# Patient Record
Sex: Female | Born: 1955 | Race: White | Hispanic: No | Marital: Married | Smoking: Never smoker
Health system: Southern US, Community
[De-identification: ages and names within clinical notes are randomized; demographics above are authoritative.]

## PROBLEM LIST (undated history)

## (undated) DIAGNOSIS — I4891 Unspecified atrial fibrillation: Secondary | ICD-10-CM

## (undated) DIAGNOSIS — C801 Malignant (primary) neoplasm, unspecified: Secondary | ICD-10-CM

## (undated) DIAGNOSIS — D649 Anemia, unspecified: Secondary | ICD-10-CM

## (undated) DIAGNOSIS — T4145XA Adverse effect of unspecified anesthetic, initial encounter: Secondary | ICD-10-CM

## (undated) DIAGNOSIS — T8859XA Other complications of anesthesia, initial encounter: Secondary | ICD-10-CM

## (undated) DIAGNOSIS — M1990 Unspecified osteoarthritis, unspecified site: Secondary | ICD-10-CM

## (undated) DIAGNOSIS — N950 Postmenopausal bleeding: Secondary | ICD-10-CM

## (undated) DIAGNOSIS — I499 Cardiac arrhythmia, unspecified: Secondary | ICD-10-CM

## (undated) DIAGNOSIS — R519 Headache, unspecified: Secondary | ICD-10-CM

## (undated) DIAGNOSIS — R51 Headache: Secondary | ICD-10-CM

## (undated) DIAGNOSIS — E669 Obesity, unspecified: Secondary | ICD-10-CM

## (undated) DIAGNOSIS — E7800 Pure hypercholesterolemia, unspecified: Secondary | ICD-10-CM

## (undated) HISTORY — DX: Obesity, unspecified: E66.9

## (undated) HISTORY — PX: TUBAL LIGATION: SHX77

## (undated) HISTORY — DX: Pure hypercholesterolemia, unspecified: E78.00

## (undated) HISTORY — PX: TOTAL HIP ARTHROPLASTY: SHX124

## (undated) HISTORY — DX: Unspecified atrial fibrillation: I48.91

## (undated) HISTORY — PX: TONSILLECTOMY: SUR1361

## (undated) HISTORY — DX: Postmenopausal bleeding: N95.0

---

## 2016-09-04 LAB — HM COLONOSCOPY

## 2016-11-06 DIAGNOSIS — I4892 Unspecified atrial flutter: Secondary | ICD-10-CM | POA: Insufficient documentation

## 2016-11-06 DIAGNOSIS — I480 Paroxysmal atrial fibrillation: Secondary | ICD-10-CM | POA: Insufficient documentation

## 2016-11-06 DIAGNOSIS — Z7901 Long term (current) use of anticoagulants: Secondary | ICD-10-CM | POA: Insufficient documentation

## 2017-05-19 DIAGNOSIS — Z7901 Long term (current) use of anticoagulants: Secondary | ICD-10-CM | POA: Diagnosis not present

## 2017-05-19 DIAGNOSIS — I480 Paroxysmal atrial fibrillation: Secondary | ICD-10-CM | POA: Diagnosis not present

## 2017-09-02 DIAGNOSIS — Z6841 Body Mass Index (BMI) 40.0 and over, adult: Secondary | ICD-10-CM | POA: Diagnosis not present

## 2017-09-02 DIAGNOSIS — E782 Mixed hyperlipidemia: Secondary | ICD-10-CM | POA: Diagnosis not present

## 2017-09-02 DIAGNOSIS — N950 Postmenopausal bleeding: Secondary | ICD-10-CM | POA: Diagnosis not present

## 2017-09-02 DIAGNOSIS — Z0000 Encounter for general adult medical examination without abnormal findings: Secondary | ICD-10-CM | POA: Diagnosis not present

## 2017-09-02 DIAGNOSIS — Z1231 Encounter for screening mammogram for malignant neoplasm of breast: Secondary | ICD-10-CM | POA: Diagnosis not present

## 2017-09-06 DIAGNOSIS — R799 Abnormal finding of blood chemistry, unspecified: Secondary | ICD-10-CM | POA: Diagnosis not present

## 2017-09-13 DIAGNOSIS — R799 Abnormal finding of blood chemistry, unspecified: Secondary | ICD-10-CM | POA: Diagnosis not present

## 2017-09-27 DIAGNOSIS — Z6841 Body Mass Index (BMI) 40.0 and over, adult: Secondary | ICD-10-CM | POA: Diagnosis not present

## 2017-09-27 DIAGNOSIS — S91321A Laceration with foreign body, right foot, initial encounter: Secondary | ICD-10-CM | POA: Diagnosis not present

## 2017-09-27 DIAGNOSIS — M79671 Pain in right foot: Secondary | ICD-10-CM | POA: Diagnosis not present

## 2017-10-01 DIAGNOSIS — M8589 Other specified disorders of bone density and structure, multiple sites: Secondary | ICD-10-CM | POA: Diagnosis not present

## 2017-10-01 DIAGNOSIS — M8580 Other specified disorders of bone density and structure, unspecified site: Secondary | ICD-10-CM | POA: Diagnosis not present

## 2017-10-01 DIAGNOSIS — N950 Postmenopausal bleeding: Secondary | ICD-10-CM | POA: Diagnosis not present

## 2017-10-01 DIAGNOSIS — Z1231 Encounter for screening mammogram for malignant neoplasm of breast: Secondary | ICD-10-CM | POA: Diagnosis not present

## 2017-10-01 DIAGNOSIS — R9389 Abnormal findings on diagnostic imaging of other specified body structures: Secondary | ICD-10-CM | POA: Diagnosis not present

## 2017-10-01 DIAGNOSIS — N959 Unspecified menopausal and perimenopausal disorder: Secondary | ICD-10-CM | POA: Diagnosis not present

## 2017-10-13 DIAGNOSIS — N8502 Endometrial intraepithelial neoplasia [EIN]: Secondary | ICD-10-CM | POA: Diagnosis not present

## 2017-10-13 DIAGNOSIS — N950 Postmenopausal bleeding: Secondary | ICD-10-CM | POA: Diagnosis not present

## 2017-10-25 ENCOUNTER — Telehealth: Payer: Self-pay | Admitting: *Deleted

## 2017-10-25 NOTE — Progress Notes (Signed)
Updated med list from dr richardson's office.

## 2017-10-25 NOTE — Telephone Encounter (Signed)
Called and left the patient a message to call the office back. Patient call be scheduled for an appt tomorrow at 11am. Called and spoke with a nurse at [facility] office, gave the appt

## 2017-10-26 ENCOUNTER — Encounter: Payer: Self-pay | Admitting: Gynecology

## 2017-10-26 ENCOUNTER — Inpatient Hospital Stay: Payer: PPO | Attending: Gynecology | Admitting: Gynecology

## 2017-10-26 VITALS — BP 141/57 | HR 58 | Temp 98.2°F | Resp 20 | Ht 62.0 in | Wt 250.6 lb

## 2017-10-26 DIAGNOSIS — N8501 Benign endometrial hyperplasia: Secondary | ICD-10-CM | POA: Insufficient documentation

## 2017-10-26 NOTE — Progress Notes (Signed)
Consult Note: Gyn-Onc   Helen West 62 y.o. female  Chief Complaint  Patient presents with  . Complex endometrial hyperplasia    Assessment : Complex atypical hyperplasia with papillary features.  Morbid obesity.  Atrial fibrillation being treated with Xarelto.  Plan: I had a good discussion with the patient and her husband regarding the natural history of complex atypical hyperplasia and the possibility that she has coexisting endometrial cancer.  Would recommend the patient undergo hysterectomy, bilateral salpingo-oophorectomy and possible sentinel node biopsy.  Surgical approaches were discussed and I recommend this be done laparoscopically with robotic assistance.  Patient and her husband are in agreement.  They understand the risks of surgery and the small possibility that she might require adjuvant therapy postoperatively.  They are desirous of going ahead with surgery next Tuesday and understand that Dr. [name] will be the primary surgeon.  She will be seen by anesthesia preoperatively as she had difficulty with speaking after being intubated for a total hip replacement.  She will withhold use of Xarelto for 5 days preoperatively.     HPI: 62 year old white married female gravida 3 seen in consultation request of Dr. Paula Compton regarding management of complex atypical hyperplasia with papillary features.  The patient presented with postmenopausal bleeding and underwent an ultrasound showing an endometrial stripe of 21 mm.  An endometrial biopsy was obtained on October 13, 2017.  Obstetrical history gravida 3.  Patient had a postpartum tubal ligation following her second child.  She has had no other abdominal or pelvic surgery.  She has had a right total hip replacement.  She has atrial fibrillation and is on Xarelto and metoprolol.  Review of Systems:10 point review of systems is negative except as noted in interval history.   Vitals: Blood pressure (!) 141/57, pulse (!)  58, temperature 98.2 F (36.8 C), temperature source Oral, resp. rate 20, height 5\' 2"  (1.575 m), weight 250 lb 9.6 oz (113.7 kg), SpO2 100 %.  Physical Exam: General : The patient is a healthy woman in no acute distress.  HEENT: normocephalic, extraoccular movements normal; neck is supple without thyromegally  Lynphnodes: Supraclavicular and inguinal nodes not enlarged  Abdomen: Obese soft, non-tender, no ascites, no organomegally, no masses, no hernias  Pelvic:  EGBUS: Normal female  Vagina: Normal, no lesions  Urethra and Bladder: Normal, non-tender  Cervix: Normal with no lesions Uterus: Difficult to delineate secondary to the patient's body habitus. Bi-manual examination: Non-tender; no adenxal masses or nodularity  Rectal: normal sphincter tone, no masses, no blood  Lower extremities: No edema or varicosities. Normal range of motion      No Known Allergies  Past Medical History:  Diagnosis Date  . A-fib ([place])   . High cholesterol   . Obesity   . Post-menopausal bleeding     Past Surgical History:  Procedure Laterality Date  . TOTAL HIP ARTHROPLASTY    . TUBAL LIGATION      Current Outpatient Medications  Medication Sig Dispense Refill  . acetaminophen (TYLENOL) 650 MG CR tablet Take 650 mg by mouth daily. Patient takes 3 tablets daily in the morning to equal a 1950mg  a day.(Per Patient)    . alendronate (FOSAMAX) 70 MG tablet Take 70 mg by mouth once a week. Take with a full glass of water on an empty stomach.    Marland Kitchen atorvastatin (LIPITOR) 10 MG tablet Take 10 mg by mouth daily.    . flecainide (TAMBOCOR) 100 MG tablet Take 100 mg by  mouth 2 (two) times daily.     . metoprolol tartrate (LOPRESSOR) 25 MG tablet Take 12.5 mg by mouth 2 (two) times daily.     Marland Kitchen OVER THE COUNTER MEDICATION Take 1 tablet by mouth daily. Mega Red 750mg  tablet once daily.    . rivaroxaban (XARELTO) 20 MG TABS tablet Take 20 mg by mouth daily with supper.     No current  facility-administered medications for this visit.     Social History   Socioeconomic History  . Marital status: Married    Spouse name: Not on file  . Number of children: Not on file  . Years of education: Not on file  . Highest education level: Not on file  Occupational History  . Not on file  Social Needs  . Financial resource strain: Not on file  . Food insecurity:    Worry: Not on file    Inability: Not on file  . Transportation needs:    Medical: Not on file    Non-medical: Not on file  Tobacco Use  . Smoking status: Never Smoker  . Smokeless tobacco: Never Used  Substance and Sexual Activity  . Alcohol use: Yes    Comment: occasional social  . Drug use: Never  . Sexual activity: Not on file  Lifestyle  . Physical activity:    Days per week: Not on file    Minutes per session: Not on file  . Stress: Not on file  Relationships  . Social connections:    Talks on phone: Not on file    Gets together: Not on file    Attends religious service: Not on file    Active member of club or organization: Not on file    Attends meetings of clubs or organizations: Not on file    Relationship status: Not on file  . Intimate partner violence:    Fear of current or ex partner: Not on file    Emotionally abused: Not on file    Physically abused: Not on file    Forced sexual activity: Not on file  Other Topics Concern  . Not on file  Social History Narrative  . Not on file    Family History  Problem Relation Age of Onset  . Aneurysm Mother   . Emphysema Father   . Cancer Paternal Uncle       Helen Sleigh, MD 10/26/2017, 12:17 PM

## 2017-10-26 NOTE — Progress Notes (Signed)
Please place orders in Epic as patient is being scheduled for a pre-op appointment! Thank you! 

## 2017-10-26 NOTE — H&P (View-Only) (Signed)
Consult Note: Gyn-Onc   Helen West 62 y.o. female  Chief Complaint  Patient presents with  . Complex endometrial hyperplasia    Assessment : Complex atypical hyperplasia with papillary features.  Morbid obesity.  Atrial fibrillation being treated with Xarelto.  Plan: I had a good discussion with the patient and her husband regarding the natural history of complex atypical hyperplasia and the possibility that she has coexisting endometrial cancer.  Would recommend the patient undergo hysterectomy, bilateral salpingo-oophorectomy and possible sentinel node biopsy.  Surgical approaches were discussed and I recommend this be done laparoscopically with robotic assistance.  Patient and her husband are in agreement.  They understand the risks of surgery and the small possibility that she might require adjuvant therapy postoperatively.  They are desirous of going ahead with surgery next Tuesday and understand that Dr. [name] will be the primary surgeon.  She will be seen by anesthesia preoperatively as she had difficulty with speaking after being intubated for a total hip replacement.  She will withhold use of Xarelto for 5 days preoperatively.     HPI: 62 year old white married female gravida 3 seen in consultation request of Dr. Paula Compton regarding management of complex atypical hyperplasia with papillary features.  The patient presented with postmenopausal bleeding and underwent an ultrasound showing an endometrial stripe of 21 mm.  An endometrial biopsy was obtained on October 13, 2017.  Obstetrical history gravida 3.  Patient had a postpartum tubal ligation following her second child.  She has had no other abdominal or pelvic surgery.  She has had a right total hip replacement.  She has atrial fibrillation and is on Xarelto and metoprolol.  Review of Systems:10 point review of systems is negative except as noted in interval history.   Vitals: Blood pressure (!) 141/57, pulse (!)  58, temperature 98.2 F (36.8 C), temperature source Oral, resp. rate 20, height 5\' 2"  (1.575 m), weight 250 lb 9.6 oz (113.7 kg), SpO2 100 %.  Physical Exam: General : The patient is a healthy woman in no acute distress.  HEENT: normocephalic, extraoccular movements normal; neck is supple without thyromegally  Lynphnodes: Supraclavicular and inguinal nodes not enlarged  Abdomen: Obese soft, non-tender, no ascites, no organomegally, no masses, no hernias  Pelvic:  EGBUS: Normal female  Vagina: Normal, no lesions  Urethra and Bladder: Normal, non-tender  Cervix: Normal with no lesions Uterus: Difficult to delineate secondary to the patient's body habitus. Bi-manual examination: Non-tender; no adenxal masses or nodularity  Rectal: normal sphincter tone, no masses, no blood  Lower extremities: No edema or varicosities. Normal range of motion      No Known Allergies  Past Medical History:  Diagnosis Date  . A-fib ([place])   . High cholesterol   . Obesity   . Post-menopausal bleeding     Past Surgical History:  Procedure Laterality Date  . TOTAL HIP ARTHROPLASTY    . TUBAL LIGATION      Current Outpatient Medications  Medication Sig Dispense Refill  . acetaminophen (TYLENOL) 650 MG CR tablet Take 650 mg by mouth daily. Patient takes 3 tablets daily in the morning to equal a 1950mg  a day.(Per Patient)    . alendronate (FOSAMAX) 70 MG tablet Take 70 mg by mouth once a week. Take with a full glass of water on an empty stomach.    Marland Kitchen atorvastatin (LIPITOR) 10 MG tablet Take 10 mg by mouth daily.    . flecainide (TAMBOCOR) 100 MG tablet Take 100 mg by  mouth 2 (two) times daily.     . metoprolol tartrate (LOPRESSOR) 25 MG tablet Take 12.5 mg by mouth 2 (two) times daily.     Marland Kitchen OVER THE COUNTER MEDICATION Take 1 tablet by mouth daily. Mega Red 750mg  tablet once daily.    . rivaroxaban (XARELTO) 20 MG TABS tablet Take 20 mg by mouth daily with supper.     No current  facility-administered medications for this visit.     Social History   Socioeconomic History  . Marital status: Married    Spouse name: Not on file  . Number of children: Not on file  . Years of education: Not on file  . Highest education level: Not on file  Occupational History  . Not on file  Social Needs  . Financial resource strain: Not on file  . Food insecurity:    Worry: Not on file    Inability: Not on file  . Transportation needs:    Medical: Not on file    Non-medical: Not on file  Tobacco Use  . Smoking status: Never Smoker  . Smokeless tobacco: Never Used  Substance and Sexual Activity  . Alcohol use: Yes    Comment: occasional social  . Drug use: Never  . Sexual activity: Not on file  Lifestyle  . Physical activity:    Days per week: Not on file    Minutes per session: Not on file  . Stress: Not on file  Relationships  . Social connections:    Talks on phone: Not on file    Gets together: Not on file    Attends religious service: Not on file    Active member of club or organization: Not on file    Attends meetings of clubs or organizations: Not on file    Relationship status: Not on file  . Intimate partner violence:    Fear of current or ex partner: Not on file    Emotionally abused: Not on file    Physically abused: Not on file    Forced sexual activity: Not on file  Other Topics Concern  . Not on file  Social History Narrative  . Not on file    Family History  Problem Relation Age of Onset  . Aneurysm Mother   . Emphysema Father   . Cancer Paternal Uncle       Marti Sleigh, MD 10/26/2017, 12:17 PM

## 2017-10-26 NOTE — Patient Instructions (Signed)
Preparing for your Surgery  Plan for surgery on November 02, 2017 with Dr. [name] at [place] will be scheduled for a robotic assisted total hysterectomy, bilateral salpingo-oophorectomy, sentinel lymph node biopsy.  Pre-operative Testing -You will receive a phone call from presurgical testing at [facility] to arrange for a pre-operative testing appointment before your surgery.  This appointment normally occurs one to two weeks before your scheduled surgery.   -Bring your insurance card, copy of an advanced directive if applicable, medication list  -At that visit, you will be asked to sign a consent for a possible blood transfusion in case a transfusion becomes necessary during surgery.  The need for a blood transfusion is rare but having consent is a necessary part of your care.     -STOP TAKING XARELTO NOW  Day Before Surgery at [place] will be asked to take in a light diet the day before surgery.  Avoid carbonated beverages.  You will be advised to have nothing to eat or drink after midnight the evening before.    Eat a light diet the day before surgery.  Examples including soups, broths, toast, yogurt, mashed potatoes.  Things to avoid include carbonated beverages (fizzy beverages), raw fruits and raw vegetables, or beans.   If your bowels are filled with gas, your surgeon will have difficulty visualizing your pelvic organs which increases your surgical risks.  Your role in recovery Your role is to become active as soon as directed by your doctor, while still giving yourself time to heal.  Rest when you feel tired. You will be asked to do the following in order to speed your recovery:  - Cough and breathe deeply. This helps toclear and expand your lungs and can prevent pneumonia. You may be given a spirometer to practice deep breathing. A staff member will show you how to use the spirometer. - Do mild physical activity. Walking or moving your  legs help your circulation and body functions return to normal. A staff member will help you when you try to walk and will provide you with simple exercises. Do not try to get up or walk alone the first time. - Actively manage your pain. Managing your pain lets you move in comfort. We will ask you to rate your pain on a scale of zero to 10. It is your responsibility to tell your doctor or nurse where and how much you hurt so your pain can be treated.  Special Considerations -If you are diabetic, you may be placed on insulin after surgery to have closer control over your blood sugars to promote healing and recovery.  This does not mean that you will be discharged on insulin.  If applicable, your oral antidiabetics will be resumed when you are tolerating a solid diet.  -Your final pathology results from surgery should be available by the Friday after surgery and the results will be relayed to you when available.  -Dr. Lahoma Crocker is the Surgeon that assists your GYN Oncologist with surgery.  The next day after your surgery you will either see your GYN Oncologist or Dr. Lahoma Crocker.   Blood Transfusion Information WHAT IS A BLOOD TRANSFUSION? A transfusion is the replacement of blood or some of its parts. Blood is made up of multiple cells which provide different functions.  Red blood cells carry oxygen and are used for blood loss replacement.  White blood cells fight against infection.  Platelets control bleeding.  Plasma helps clot  blood.  Other blood products are available for specialized needs, such as hemophilia or other clotting disorders. BEFORE THE TRANSFUSION  Who gives blood for transfusions?   You may be able to donate blood to be used at a later date on yourself (autologous donation).  Relatives can be asked to donate blood. This is generally not any safer than if you have received blood from a stranger. The same precautions are taken to ensure safety when a  relative's blood is donated.  Healthy volunteers who are fully evaluated to make sure their blood is safe. This is blood bank blood. Transfusion therapy is the safest it has ever been in the practice of medicine. Before blood is taken from a donor, a complete history is taken to make sure that person has no history of diseases nor engages in risky social behavior (examples are intravenous drug use or sexual activity with multiple partners). The donor's travel history is screened to minimize risk of transmitting infections, such as malaria. The donated blood is tested for signs of infectious diseases, such as HIV and hepatitis. The blood is then tested to be sure it is compatible with you in order to minimize the chance of a transfusion reaction. If you or a relative donates blood, this is often done in anticipation of surgery and is not appropriate for emergency situations. It takes many days to process the donated blood. RISKS AND COMPLICATIONS Although transfusion therapy is very safe and saves many lives, the main dangers of transfusion include:   Getting an infectious disease.  Developing a transfusion reaction. This is an allergic reaction to something in the blood you were given. Every precaution is taken to prevent this. The decision to have a blood transfusion has been considered carefully by your caregiver before blood is given. Blood is not given unless the benefits outweigh the risks.

## 2017-10-29 ENCOUNTER — Telehealth: Payer: Self-pay | Admitting: Oncology

## 2017-10-29 ENCOUNTER — Encounter: Payer: Self-pay | Admitting: Oncology

## 2017-10-29 NOTE — Telephone Encounter (Signed)
Called patient and verfied that she does see Dr. Adrian Prows for cardiology in [place].  She also mentioned her records may be under her maiden name - Pearo.  Called the [place] office and requested last office note and any procedures that she has had.  Also left a message for Dr. Blane Ohara nurse, Lovena Le.

## 2017-10-29 NOTE — Patient Instructions (Signed)
Helen West  10/29/2017   Your procedure is scheduled on: 11-02-17     Report to [facility] Main  Entrance    Report to Admitting at 5:30 AM    Call this number if you have problems the morning of surgery [phone]    Remember: Do not eat food or drink liquids :After Midnight.  Eat a light diet the day before surgery.  Examples including soups, broths, toast, yogurt, mashed potatoes.  Things to avoid include carbonated beverages (fizzy beverages), raw fruits and raw vegetables, or beans.   If your bowels are filled with gas, your surgeon will have difficulty visualizing your pelvic organs which increases your surgical risks.     Take these medicines the morning of surgery with A SIP OF WATER: Metoprolol Tartrate (Lopressor), Flecainide (Tambocor), and Atorvastatin (Lipitor)                                You may not have any metal on your body including hair pins and              piercings  Do not wear jewelry, make-up, lotions, powders or perfumes, deodorant             Do not wear nail polish.  Do not shave  48 hours prior to surgery.              Do not bring valuables to the hospital. [place].  Contacts, dentures or bridgework may not be worn into surgery.  Leave suitcase in the car. After surgery it may be brought to your room.   :  Special Instructions: Cough and Deep Breathe Exercises              Please read over the following fact sheets you were given: _____________________________________________________________________             [facility] - Preparing for Surgery Before surgery, you can play an important role.  Because skin is not sterile, your skin needs to be as free of germs as possible.  You can reduce the number of germs on your skin by washing with CHG (chlorahexidine gluconate) soap before surgery.  CHG is an antiseptic cleaner which kills germs and bonds with the skin to  continue killing germs even after washing. Please DO NOT use if you have an allergy to CHG or antibacterial soaps.  If your skin becomes reddened/irritated stop using the CHG and inform your nurse when you arrive at Short Stay. Do not shave (including legs and underarms) for at least 48 hours prior to the first CHG shower.  You may shave your face/neck. Please follow these instructions carefully:  1.  Shower with CHG Soap the night before surgery and the  morning of Surgery.  2.  If you choose to wash your hair, wash your hair first as usual with your  normal  shampoo.  3.  After you shampoo, rinse your hair and body thoroughly to remove the  shampoo.                           4.  Use CHG as you would any other liquid soap.  You can apply  chg directly  to the skin and wash                       Gently with a scrungie or clean washcloth.  5.  Apply the CHG Soap to your body ONLY FROM THE NECK DOWN.   Do not use on face/ open                           Wound or open sores. Avoid contact with eyes, ears mouth and genitals (private parts).                       Wash face,  Genitals (private parts) with your normal soap.             6.  Wash thoroughly, paying special attention to the area where your surgery  will be performed.  7.  Thoroughly rinse your body with warm water from the neck down.  8.  DO NOT shower/wash with your normal soap after using and rinsing off  the CHG Soap.                9.  Pat yourself dry with a clean towel.            10.  Wear clean pajamas.            11.  Place clean sheets on your bed the night of your first shower and do not  sleep with pets. Day of Surgery : Do not apply any lotions/deodorants the morning of surgery.  Please wear clean clothes to the hospital/surgery center.  FAILURE TO FOLLOW THESE INSTRUCTIONS MAY RESULT IN THE CANCELLATION OF YOUR SURGERY PATIENT SIGNATURE_________________________________  NURSE  SIGNATURE__________________________________  ________________________________________________________________________   Adam Phenix  An incentive spirometer is a tool that can help keep your lungs clear and active. This tool measures how well you are filling your lungs with each breath. Taking long deep breaths may help reverse or decrease the chance of developing breathing (pulmonary) problems (especially infection) following:  A long period of time when you are unable to move or be active. BEFORE THE PROCEDURE   If the spirometer includes an indicator to show your best effort, your nurse or respiratory therapist will set it to a desired goal.  If possible, sit up straight or lean slightly forward. Try not to slouch.  Hold the incentive spirometer in an upright position. INSTRUCTIONS FOR USE  1. Sit on the edge of your bed if possible, or sit up as far as you can in bed or on a chair. 2. Hold the incentive spirometer in an upright position. 3. Breathe out normally. 4. Place the mouthpiece in your mouth and seal your lips tightly around it. 5. Breathe in slowly and as deeply as possible, raising the piston or the ball toward the top of the column. 6. Hold your breath for 3-5 seconds or for as long as possible. Allow the piston or ball to fall to the bottom of the column. 7. Remove the mouthpiece from your mouth and breathe out normally. 8. Rest for a few seconds and repeat Steps 1 through 7 at least 10 times every 1-2 hours when you are awake. Take your time and take a few normal breaths between deep breaths. 9. The spirometer may include an indicator to show your best effort. Use the indicator as a goal to work toward during each repetition.  10. After each set of 10 deep breaths, practice coughing to be sure your lungs are clear. If you have an incision (the cut made at the time of surgery), support your incision when coughing by placing a pillow or rolled up towels firmly  against it. Once you are able to get out of bed, walk around indoors and cough well. You may stop using the incentive spirometer when instructed by your caregiver.  RISKS AND COMPLICATIONS  Take your time so you do not get dizzy or light-headed.  If you are in pain, you may need to take or ask for pain medication before doing incentive spirometry. It is harder to take a deep breath if you are having pain. AFTER USE  Rest and breathe slowly and easily.  It can be helpful to keep track of a log of your progress. Your caregiver can provide you with a simple table to help with this. If you are using the spirometer at home, follow these instructions: [place] IF:   You are having difficultly using the spirometer.  You have trouble using the spirometer as often as instructed.  Your pain medication is not giving enough relief while using the spirometer.  You develop fever of 100.5 F (38.1 C) or higher. SEEK IMMEDIATE MEDICAL CARE IF:   You cough up bloody sputum that had not been present before.  You develop fever of 102 F (38.9 C) or greater.  You develop worsening pain at or near the incision site. MAKE SURE YOU:   Understand these instructions.  Will watch your condition.  Will get help right away if you are not doing well or get worse. Document Released: 06/01/2006 Document Revised: 04/13/2011 Document Reviewed: 08/02/2006 ExitCare Patient Information 2014 ExitCare, [place].   ________________________________________________________________________  WHAT IS A BLOOD TRANSFUSION? Blood Transfusion Information  A transfusion is the replacement of blood or some of its parts. Blood is made up of multiple cells which provide different functions.  Red blood cells carry oxygen and are used for blood loss replacement.  White blood cells fight against infection.  Platelets control bleeding.  Plasma helps clot blood.  Other blood products are available for  specialized needs, such as hemophilia or other clotting disorders. BEFORE THE TRANSFUSION  Who gives blood for transfusions?   Healthy volunteers who are fully evaluated to make sure their blood is safe. This is blood bank blood. Transfusion therapy is the safest it has ever been in the practice of medicine. Before blood is taken from a donor, a complete history is taken to make sure that person has no history of diseases nor engages in risky social behavior (examples are intravenous drug use or sexual activity with multiple partners). The donor's travel history is screened to minimize risk of transmitting infections, such as malaria. The donated blood is tested for signs of infectious diseases, such as HIV and hepatitis. The blood is then tested to be sure it is compatible with you in order to minimize the chance of a transfusion reaction. If you or a relative donates blood, this is often done in anticipation of surgery and is not appropriate for emergency situations. It takes many days to process the donated blood. RISKS AND COMPLICATIONS Although transfusion therapy is very safe and saves many lives, the main dangers of transfusion include:   Getting an infectious disease.  Developing a transfusion reaction. This is an allergic reaction to something in the blood you were given. Every precaution is taken to prevent this. The  decision to have a blood transfusion has been considered carefully by your caregiver before blood is given. Blood is not given unless the benefits outweigh the risks. AFTER THE TRANSFUSION  Right after receiving a blood transfusion, you will usually feel much better and more energetic. This is especially true if your red blood cells have gotten low (anemic). The transfusion raises the level of the red blood cells which carry oxygen, and this usually causes an energy increase.  The nurse administering the transfusion will monitor you carefully for complications. HOME CARE  INSTRUCTIONS  No special instructions are needed after a transfusion. You may find your energy is better. Speak with your caregiver about any limitations on activity for underlying diseases you may have. SEEK MEDICAL CARE IF:   Your condition is not improving after your transfusion.  You develop redness or irritation at the intravenous (IV) site. SEEK IMMEDIATE MEDICAL CARE IF:  Any of the following symptoms occur over the next 12 hours:  Shaking chills.  You have a temperature by mouth above 102 F (38.9 C), not controlled by medicine.  Chest, back, or muscle pain.  People around you feel you are not acting correctly or are confused.  Shortness of breath or difficulty breathing.  Dizziness and fainting.  You get a rash or develop hives.  You have a decrease in urine output.  Your urine turns a dark color or changes to pink, red, or brown. Any of the following symptoms occur over the next 10 days:  You have a temperature by mouth above 102 F (38.9 C), not controlled by medicine.  Shortness of breath.  Weakness after normal activity.  The white part of the eye turns yellow (jaundice).  You have a decrease in the amount of urine or are urinating less often.  Your urine turns a dark color or changes to pink, red, or brown. Document Released: 01/17/2000 Document Revised: 04/13/2011 Document Reviewed: 09/05/2007 [facility] Patient Information 2014 [place], [place].  _______________________________________________________________________

## 2017-11-01 ENCOUNTER — Encounter (HOSPITAL_COMMUNITY): Payer: Self-pay | Admitting: *Deleted

## 2017-11-01 ENCOUNTER — Other Ambulatory Visit: Payer: Self-pay

## 2017-11-01 ENCOUNTER — Encounter (HOSPITAL_COMMUNITY)
Admission: RE | Admit: 2017-11-01 | Discharge: 2017-11-01 | Disposition: A | Discharge status: Home / Self Care | Payer: PPO | Source: Ambulatory Visit | Attending: Gynecologic Oncology | Admitting: Gynecologic Oncology

## 2017-11-01 DIAGNOSIS — Z79899 Other long term (current) drug therapy: Secondary | ICD-10-CM | POA: Diagnosis not present

## 2017-11-01 DIAGNOSIS — Z01812 Encounter for preprocedural laboratory examination: Secondary | ICD-10-CM

## 2017-11-01 DIAGNOSIS — N8502 Endometrial intraepithelial neoplasia [EIN]: Secondary | ICD-10-CM | POA: Insufficient documentation

## 2017-11-01 DIAGNOSIS — E7800 Pure hypercholesterolemia, unspecified: Secondary | ICD-10-CM | POA: Diagnosis not present

## 2017-11-01 DIAGNOSIS — I4891 Unspecified atrial fibrillation: Secondary | ICD-10-CM | POA: Diagnosis not present

## 2017-11-01 DIAGNOSIS — Z6841 Body Mass Index (BMI) 40.0 and over, adult: Secondary | ICD-10-CM | POA: Diagnosis not present

## 2017-11-01 DIAGNOSIS — C541 Malignant neoplasm of endometrium: Secondary | ICD-10-CM | POA: Diagnosis not present

## 2017-11-01 DIAGNOSIS — Z7901 Long term (current) use of anticoagulants: Secondary | ICD-10-CM | POA: Diagnosis not present

## 2017-11-01 HISTORY — DX: Anemia, unspecified: D64.9

## 2017-11-01 HISTORY — DX: Headache: R51

## 2017-11-01 HISTORY — DX: Cardiac arrhythmia, unspecified: I49.9

## 2017-11-01 HISTORY — DX: Unspecified osteoarthritis, unspecified site: M19.90

## 2017-11-01 HISTORY — DX: Headache, unspecified: R51.9

## 2017-11-01 HISTORY — DX: Adverse effect of unspecified anesthetic, initial encounter: T41.45XA

## 2017-11-01 HISTORY — DX: Other complications of anesthesia, initial encounter: T88.59XA

## 2017-11-01 HISTORY — DX: Malignant (primary) neoplasm, unspecified: C80.1

## 2017-11-01 LAB — URINALYSIS, ROUTINE W REFLEX MICROSCOPIC
Bacteria, UA: NONE SEEN
Bilirubin Urine: NEGATIVE
Glucose, UA: NEGATIVE mg/dL
KETONES UR: NEGATIVE mg/dL
Nitrite: NEGATIVE
PROTEIN: NEGATIVE mg/dL
Specific Gravity, Urine: 1.013 (ref 1.005–1.030)
pH: 6 (ref 5.0–8.0)

## 2017-11-01 LAB — COMPREHENSIVE METABOLIC PANEL
ALT: 14 U/L (ref 0–44)
ANION GAP: 9 (ref 5–15)
AST: 17 U/L (ref 15–41)
Albumin: 4 g/dL (ref 3.5–5.0)
Alkaline Phosphatase: 75 U/L (ref 38–126)
BILIRUBIN TOTAL: 0.7 mg/dL (ref 0.3–1.2)
BUN: 15 mg/dL (ref 8–23)
CO2: 30 mmol/L (ref 22–32)
Calcium: 9.4 mg/dL (ref 8.9–10.3)
Chloride: 105 mmol/L (ref 98–111)
Creatinine, Ser: 0.73 mg/dL (ref 0.44–1.00)
Glucose, Bld: 107 mg/dL — ABNORMAL HIGH (ref 70–99)
POTASSIUM: 5.4 mmol/L — AB (ref 3.5–5.1)
Sodium: 144 mmol/L (ref 135–145)
TOTAL PROTEIN: 7.1 g/dL (ref 6.5–8.1)

## 2017-11-01 LAB — CBC
HEMATOCRIT: 43.1 % (ref 36.0–46.0)
Hemoglobin: 14 g/dL (ref 12.0–15.0)
MCH: 31 pg (ref 26.0–34.0)
MCHC: 32.5 g/dL (ref 30.0–36.0)
MCV: 95.6 fL (ref 78.0–100.0)
Platelets: 316 10*3/uL (ref 150–400)
RBC: 4.51 MIL/uL (ref 3.87–5.11)
RDW: 13.3 % (ref 11.5–15.5)
WBC: 5.8 10*3/uL (ref 4.0–10.5)

## 2017-11-01 LAB — ABO/RH: ABO/RH(D): A POS

## 2017-11-01 NOTE — Progress Notes (Signed)
10-29-17 Cardiac Clearance on chart  01-19-17 EKG on chart, under pt's maiden name (Pearo)

## 2017-11-02 ENCOUNTER — Ambulatory Visit (HOSPITAL_COMMUNITY)
Admission: RE | Admit: 2017-11-02 | Discharge: 2017-11-03 | Disposition: A | Discharge status: Home / Self Care | Payer: PPO | Source: Other Acute Inpatient Hospital | Attending: Gynecologic Oncology | Admitting: Gynecologic Oncology

## 2017-11-02 ENCOUNTER — Ambulatory Visit (HOSPITAL_COMMUNITY): Payer: PPO | Admitting: Anesthesiology

## 2017-11-02 ENCOUNTER — Encounter (HOSPITAL_COMMUNITY): Payer: Self-pay

## 2017-11-02 ENCOUNTER — Other Ambulatory Visit: Payer: Self-pay

## 2017-11-02 ENCOUNTER — Encounter (HOSPITAL_COMMUNITY)
Admission: RE | Disposition: A | Discharge status: Home / Self Care | Payer: Self-pay | Source: Other Acute Inpatient Hospital | Attending: Gynecologic Oncology

## 2017-11-02 DIAGNOSIS — Z7901 Long term (current) use of anticoagulants: Secondary | ICD-10-CM | POA: Diagnosis not present

## 2017-11-02 DIAGNOSIS — Z6841 Body Mass Index (BMI) 40.0 and over, adult: Secondary | ICD-10-CM | POA: Insufficient documentation

## 2017-11-02 DIAGNOSIS — C541 Malignant neoplasm of endometrium: Secondary | ICD-10-CM

## 2017-11-02 DIAGNOSIS — C542 Malignant neoplasm of myometrium: Secondary | ICD-10-CM | POA: Diagnosis not present

## 2017-11-02 DIAGNOSIS — Z79899 Other long term (current) drug therapy: Secondary | ICD-10-CM | POA: Diagnosis not present

## 2017-11-02 DIAGNOSIS — I4891 Unspecified atrial fibrillation: Secondary | ICD-10-CM | POA: Diagnosis not present

## 2017-11-02 DIAGNOSIS — N8502 Endometrial intraepithelial neoplasia [EIN]: Secondary | ICD-10-CM

## 2017-11-02 DIAGNOSIS — E7800 Pure hypercholesterolemia, unspecified: Secondary | ICD-10-CM | POA: Diagnosis not present

## 2017-11-02 HISTORY — PX: SENTINEL NODE BIOPSY: SHX6608

## 2017-11-02 HISTORY — PX: ROBOTIC ASSISTED TOTAL HYSTERECTOMY WITH BILATERAL SALPINGO OOPHERECTOMY: SHX6086

## 2017-11-02 HISTORY — DX: Malignant neoplasm of endometrium: C54.1

## 2017-11-02 HISTORY — DX: Endometrial intraepithelial neoplasia (EIN): N85.02

## 2017-11-02 LAB — TYPE AND SCREEN
ABO/RH(D): A POS
ANTIBODY SCREEN: NEGATIVE

## 2017-11-02 SURGERY — HYSTERECTOMY, TOTAL, ROBOT-ASSISTED, LAPAROSCOPIC, WITH BILATERAL SALPINGO-OOPHORECTOMY
Anesthesia: General

## 2017-11-02 MED ORDER — CELECOXIB 200 MG PO CAPS
400.0000 mg | ORAL_CAPSULE | ORAL | Status: AC
  Administered 2017-11-02: 400 mg via ORAL
  Filled 2017-11-02: qty 2

## 2017-11-02 MED ORDER — PHENYLEPHRINE 40 MCG/ML (10ML) SYRINGE FOR IV PUSH (FOR BLOOD PRESSURE SUPPORT)
PREFILLED_SYRINGE | INTRAVENOUS | Status: AC
  Filled 2017-11-02: qty 10

## 2017-11-02 MED ORDER — ROCURONIUM BROMIDE 10 MG/ML (PF) SYRINGE
PREFILLED_SYRINGE | INTRAVENOUS | Status: DC | PRN
  Administered 2017-11-02: 60 mg via INTRAVENOUS

## 2017-11-02 MED ORDER — KETAMINE HCL 10 MG/ML IJ SOLN
INTRAMUSCULAR | Status: AC
  Filled 2017-11-02: qty 1

## 2017-11-02 MED ORDER — SUCCINYLCHOLINE CHLORIDE 200 MG/10ML IV SOSY
PREFILLED_SYRINGE | INTRAVENOUS | Status: AC
  Filled 2017-11-02: qty 10

## 2017-11-02 MED ORDER — PROPOFOL 10 MG/ML IV BOLUS
INTRAVENOUS | Status: DC | PRN
  Administered 2017-11-02: 120 mg via INTRAVENOUS

## 2017-11-02 MED ORDER — ACETAMINOPHEN 10 MG/ML IV SOLN
INTRAVENOUS | Status: AC
  Filled 2017-11-02: qty 100

## 2017-11-02 MED ORDER — ENOXAPARIN SODIUM 40 MG/0.4ML ~~LOC~~ SOLN
40.0000 mg | SUBCUTANEOUS | Status: AC
  Administered 2017-11-02: 40 mg via SUBCUTANEOUS
  Filled 2017-11-02: qty 0.4

## 2017-11-02 MED ORDER — DEXAMETHASONE SODIUM PHOSPHATE 4 MG/ML IJ SOLN: 4.0000 mg | INTRAMUSCULAR | Status: DC

## 2017-11-02 MED ORDER — HYDRALAZINE HCL 20 MG/ML IJ SOLN
INTRAMUSCULAR | Status: AC
  Filled 2017-11-02: qty 1

## 2017-11-02 MED ORDER — OXYCODONE HCL 5 MG PO TABS: 5.0000 mg | ORAL_TABLET | ORAL | Status: DC | PRN

## 2017-11-02 MED ORDER — DEXAMETHASONE SODIUM PHOSPHATE 10 MG/ML IJ SOLN
INTRAMUSCULAR | Status: AC
  Filled 2017-11-02: qty 1

## 2017-11-02 MED ORDER — KETOROLAC TROMETHAMINE 30 MG/ML IJ SOLN: 15.0000 mg | Freq: Once | INTRAMUSCULAR | Status: DC

## 2017-11-02 MED ORDER — EPHEDRINE SULFATE-NACL 50-0.9 MG/10ML-% IV SOSY
PREFILLED_SYRINGE | INTRAVENOUS | Status: DC | PRN
  Administered 2017-11-02 (×2): 15 mg via INTRAVENOUS

## 2017-11-02 MED ORDER — METOPROLOL TARTRATE 12.5 MG HALF TABLET
12.5000 mg | ORAL_TABLET | Freq: Two times a day (BID) | ORAL | Status: DC
  Administered 2017-11-02 – 2017-11-03 (×2): 12.5 mg via ORAL
  Filled 2017-11-02 (×2): qty 1

## 2017-11-02 MED ORDER — KCL IN DEXTROSE-NACL 20-5-0.45 MEQ/L-%-% IV SOLN
INTRAVENOUS | Status: DC
  Administered 2017-11-02: 14:00:00 via INTRAVENOUS
  Filled 2017-11-02: qty 1000

## 2017-11-02 MED ORDER — FENTANYL CITRATE (PF) 100 MCG/2ML IJ SOLN
INTRAMUSCULAR | Status: AC
  Filled 2017-11-02: qty 2

## 2017-11-02 MED ORDER — MIDAZOLAM HCL 2 MG/2ML IJ SOLN
INTRAMUSCULAR | Status: DC | PRN
  Administered 2017-11-02: 2 mg via INTRAVENOUS

## 2017-11-02 MED ORDER — ROCURONIUM BROMIDE 10 MG/ML (PF) SYRINGE
PREFILLED_SYRINGE | INTRAVENOUS | Status: AC
  Filled 2017-11-02: qty 10

## 2017-11-02 MED ORDER — ARTIFICIAL TEARS OPHTHALMIC OINT
TOPICAL_OINTMENT | OPHTHALMIC | Status: AC
  Filled 2017-11-02: qty 3.5

## 2017-11-02 MED ORDER — ONDANSETRON HCL 4 MG PO TABS: 4.0000 mg | ORAL_TABLET | Freq: Four times a day (QID) | ORAL | Status: DC | PRN

## 2017-11-02 MED ORDER — FENTANYL CITRATE (PF) 250 MCG/5ML IJ SOLN
INTRAMUSCULAR | Status: AC
  Filled 2017-11-02: qty 5

## 2017-11-02 MED ORDER — DEXAMETHASONE SODIUM PHOSPHATE 10 MG/ML IJ SOLN
INTRAMUSCULAR | Status: DC | PRN
  Administered 2017-11-02: 5 mg via INTRAVENOUS

## 2017-11-02 MED ORDER — PROMETHAZINE HCL 25 MG/ML IJ SOLN: 6.2500 mg | INTRAMUSCULAR | Status: DC | PRN

## 2017-11-02 MED ORDER — PROPOFOL 10 MG/ML IV BOLUS
INTRAVENOUS | Status: AC
  Filled 2017-11-02: qty 40

## 2017-11-02 MED ORDER — KETOROLAC TROMETHAMINE 30 MG/ML IJ SOLN
INTRAMUSCULAR | Status: AC
  Filled 2017-11-02: qty 1

## 2017-11-02 MED ORDER — KETAMINE HCL 10 MG/ML IJ SOLN
INTRAMUSCULAR | Status: DC | PRN
  Administered 2017-11-02: 20 mg via INTRAVENOUS
  Administered 2017-11-02: 30 mg via INTRAVENOUS

## 2017-11-02 MED ORDER — MIDAZOLAM HCL 2 MG/2ML IJ SOLN
INTRAMUSCULAR | Status: AC
  Filled 2017-11-02: qty 2

## 2017-11-02 MED ORDER — ONDANSETRON HCL 4 MG/2ML IJ SOLN
INTRAMUSCULAR | Status: DC | PRN
  Administered 2017-11-02: 4 mg via INTRAVENOUS

## 2017-11-02 MED ORDER — HYDRALAZINE HCL 20 MG/ML IJ SOLN
5.0000 mg | Freq: Once | INTRAMUSCULAR | Status: AC
  Administered 2017-11-02: 5 mg via INTRAVENOUS

## 2017-11-02 MED ORDER — PHENYLEPHRINE HCL 10 MG/ML IJ SOLN
INTRAMUSCULAR | Status: AC
  Filled 2017-11-02: qty 1

## 2017-11-02 MED ORDER — LIDOCAINE 2% (20 MG/ML) 5 ML SYRINGE
INTRAMUSCULAR | Status: DC | PRN
  Administered 2017-11-02: 60 mg via INTRAVENOUS

## 2017-11-02 MED ORDER — LIDOCAINE 2% (20 MG/ML) 5 ML SYRINGE
INTRAMUSCULAR | Status: DC | PRN
  Administered 2017-11-02: 1.5 mg/kg/h via INTRAVENOUS

## 2017-11-02 MED ORDER — LACTATED RINGERS IV SOLN
INTRAVENOUS | Status: DC
  Administered 2017-11-02 (×2): 1000 mL via INTRAVENOUS

## 2017-11-02 MED ORDER — EPHEDRINE 5 MG/ML INJ
INTRAVENOUS | Status: AC
  Filled 2017-11-02: qty 10

## 2017-11-02 MED ORDER — LIDOCAINE 2% (20 MG/ML) 5 ML SYRINGE
INTRAMUSCULAR | Status: AC
  Filled 2017-11-02: qty 5

## 2017-11-02 MED ORDER — OXYCODONE HCL 5 MG/5ML PO SOLN
5.0000 mg | Freq: Once | ORAL | Status: DC | PRN
  Filled 2017-11-02: qty 5

## 2017-11-02 MED ORDER — ACETAMINOPHEN 500 MG PO TABS
1000.0000 mg | ORAL_TABLET | ORAL | Status: AC
  Administered 2017-11-02: 1000 mg via ORAL
  Filled 2017-11-02: qty 2

## 2017-11-02 MED ORDER — KETOROLAC TROMETHAMINE 30 MG/ML IJ SOLN
30.0000 mg | Freq: Once | INTRAMUSCULAR | Status: AC | PRN
  Administered 2017-11-02: 30 mg via INTRAVENOUS

## 2017-11-02 MED ORDER — SODIUM CHLORIDE 0.9 % IR SOLN
Status: DC | PRN
  Administered 2017-11-02: 1000 mL

## 2017-11-02 MED ORDER — HYDROMORPHONE HCL 1 MG/ML IJ SOLN: 0.5000 mg | INTRAMUSCULAR | Status: DC | PRN

## 2017-11-02 MED ORDER — ACETAMINOPHEN 500 MG PO TABS
1000.0000 mg | ORAL_TABLET | Freq: Four times a day (QID) | ORAL | Status: DC
  Administered 2017-11-02 – 2017-11-03 (×4): 1000 mg via ORAL
  Filled 2017-11-02 (×4): qty 2

## 2017-11-02 MED ORDER — GABAPENTIN 300 MG PO CAPS
600.0000 mg | ORAL_CAPSULE | Freq: Every day | ORAL | Status: AC
  Administered 2017-11-02: 600 mg via ORAL
  Filled 2017-11-02: qty 2

## 2017-11-02 MED ORDER — DICLOFENAC SODIUM 50 MG PO TBEC
50.0000 mg | DELAYED_RELEASE_TABLET | Freq: Four times a day (QID) | ORAL | Status: DC
  Administered 2017-11-02 – 2017-11-03 (×4): 50 mg via ORAL
  Filled 2017-11-02 (×7): qty 1

## 2017-11-02 MED ORDER — FENTANYL CITRATE (PF) 100 MCG/2ML IJ SOLN
25.0000 ug | INTRAMUSCULAR | Status: DC | PRN
  Administered 2017-11-02 (×3): 50 ug via INTRAVENOUS

## 2017-11-02 MED ORDER — OXYCODONE HCL 5 MG PO TABS: 5.0000 mg | ORAL_TABLET | Freq: Once | ORAL | Status: DC | PRN

## 2017-11-02 MED ORDER — SENNOSIDES-DOCUSATE SODIUM 8.6-50 MG PO TABS
2.0000 | ORAL_TABLET | Freq: Every day | ORAL | Status: DC
  Administered 2017-11-02: 2 via ORAL
  Filled 2017-11-02: qty 2

## 2017-11-02 MED ORDER — SUGAMMADEX SODIUM 200 MG/2ML IV SOLN
INTRAVENOUS | Status: DC | PRN
  Administered 2017-11-02: 200 mg via INTRAVENOUS

## 2017-11-02 MED ORDER — ONDANSETRON HCL 4 MG/2ML IJ SOLN: 4.0000 mg | Freq: Four times a day (QID) | INTRAMUSCULAR | Status: DC | PRN

## 2017-11-02 MED ORDER — SCOPOLAMINE 1 MG/3DAYS TD PT72
1.0000 | MEDICATED_PATCH | TRANSDERMAL | Status: DC
  Administered 2017-11-02: 1.5 mg via TRANSDERMAL
  Filled 2017-11-02: qty 1

## 2017-11-02 MED ORDER — ONDANSETRON HCL 4 MG/2ML IJ SOLN
INTRAMUSCULAR | Status: AC
  Filled 2017-11-02: qty 2

## 2017-11-02 MED ORDER — CEFAZOLIN SODIUM-DEXTROSE 2-4 GM/100ML-% IV SOLN
2.0000 g | INTRAVENOUS | Status: AC
  Administered 2017-11-02: 2 g via INTRAVENOUS
  Filled 2017-11-02: qty 100

## 2017-11-02 MED ORDER — FENTANYL CITRATE (PF) 100 MCG/2ML IJ SOLN
INTRAMUSCULAR | Status: DC | PRN
  Administered 2017-11-02: 50 ug via INTRAVENOUS
  Administered 2017-11-02 (×2): 100 ug via INTRAVENOUS

## 2017-11-02 MED ORDER — GABAPENTIN 300 MG PO CAPS
300.0000 mg | ORAL_CAPSULE | ORAL | Status: AC
  Administered 2017-11-02: 300 mg via ORAL
  Filled 2017-11-02: qty 1

## 2017-11-02 MED ORDER — SCOPOLAMINE 1 MG/3DAYS TD PT72
MEDICATED_PATCH | TRANSDERMAL | Status: AC
  Filled 2017-11-02: qty 1

## 2017-11-02 SURGICAL SUPPLY — 48 items
APPLICATOR SURGIFLO ENDO (HEMOSTASIS) IMPLANT
BAG LAPAROSCOPIC 12 15 PORT 16 (BASKET) IMPLANT
BAG RETRIEVAL 12/15 (BASKET)
COVER BACK TABLE 60X90IN (DRAPES) ×3 IMPLANT
COVER TIP SHEARS 8 DVNC (MISCELLANEOUS) ×2 IMPLANT
COVER TIP SHEARS 8MM DA VINCI (MISCELLANEOUS) ×1
DERMABOND ADVANCED (GAUZE/BANDAGES/DRESSINGS) ×1
DERMABOND ADVANCED .7 DNX12 (GAUZE/BANDAGES/DRESSINGS) ×2 IMPLANT
DRAPE ARM DVNC X/XI (DISPOSABLE) ×8 IMPLANT
DRAPE COLUMN DVNC XI (DISPOSABLE) ×2 IMPLANT
DRAPE DA VINCI XI ARM (DISPOSABLE) ×4
DRAPE DA VINCI XI COLUMN (DISPOSABLE) ×1
DRAPE SHEET LG 3/4 BI-LAMINATE (DRAPES) ×3 IMPLANT
DRAPE SURG IRRIG POUCH 19X23 (DRAPES) ×3 IMPLANT
ELECT REM PT RETURN 15FT ADLT (MISCELLANEOUS) ×3 IMPLANT
GLOVE BIO SURGEON STRL SZ 6 (GLOVE) ×12 IMPLANT
GLOVE BIO SURGEON STRL SZ 6.5 (GLOVE) IMPLANT
GOWN STRL REUS W/ TWL LRG LVL3 (GOWN DISPOSABLE) ×4 IMPLANT
GOWN STRL REUS W/TWL LRG LVL3 (GOWN DISPOSABLE) ×2
HOLDER FOLEY CATH W/STRAP (MISCELLANEOUS) IMPLANT
IRRIG SUCT STRYKERFLOW 2 WTIP (MISCELLANEOUS) ×3
IRRIGATION SUCT STRKRFLW 2 WTP (MISCELLANEOUS) ×2 IMPLANT
KIT PROCEDURE DA VINCI SI (MISCELLANEOUS) ×1
KIT PROCEDURE DVNC SI (MISCELLANEOUS) ×2 IMPLANT
MANIPULATOR UTERINE 4.5 ZUMI (MISCELLANEOUS) ×3 IMPLANT
NEEDLE SPNL 18GX3.5 QUINCKE PK (NEEDLE) IMPLANT
OBTURATOR OPTICAL STANDARD 8MM (TROCAR) ×1
OBTURATOR OPTICAL STND 8 DVNC (TROCAR) ×2
OBTURATOR OPTICALSTD 8 DVNC (TROCAR) ×2 IMPLANT
PACK ROBOT GYN CUSTOM WL (TRAY / TRAY PROCEDURE) ×3 IMPLANT
PAD POSITIONING PINK XL (MISCELLANEOUS) ×3 IMPLANT
PORT ACCESS TROCAR AIRSEAL 12 (TROCAR) ×2 IMPLANT
PORT ACCESS TROCAR AIRSEAL 5M (TROCAR) ×1
POUCH SPECIMEN RETRIEVAL 10MM (ENDOMECHANICALS) IMPLANT
SEAL CANN UNIV 5-8 DVNC XI (MISCELLANEOUS) ×8 IMPLANT
SEAL XI 5MM-8MM UNIVERSAL (MISCELLANEOUS) ×4
SET TRI-LUMEN FLTR TB AIRSEAL (TUBING) ×3 IMPLANT
SURGIFLO W/THROMBIN 8M KIT (HEMOSTASIS) IMPLANT
SUT VIC AB 0 CT1 27 (SUTURE)
SUT VIC AB 0 CT1 27XBRD ANTBC (SUTURE) IMPLANT
SUT VIC AB 3-0 SH 27 (SUTURE) ×3
SUT VIC AB 3-0 SH 27X BRD (SUTURE) ×6 IMPLANT
SYR 10ML LL (SYRINGE) IMPLANT
TOWEL OR NON WOVEN STRL DISP B (DISPOSABLE) ×3 IMPLANT
TRAP SPECIMEN MUCOUS 40CC (MISCELLANEOUS) IMPLANT
TRAY FOLEY MTR SLVR 16FR STAT (SET/KITS/TRAYS/PACK) ×3 IMPLANT
UNDERPAD 30X30 (UNDERPADS AND DIAPERS) ×3 IMPLANT
WATER STERILE IRR 1000ML POUR (IV SOLUTION) ×3 IMPLANT

## 2017-11-02 NOTE — Anesthesia Postprocedure Evaluation (Signed)
Anesthesia Post Note  Patient: Helen West  Procedure(s) Performed: XI ROBOTIC ASSISTED TOTAL HYSTERECTOMY WITH BILATERAL SALPINGO OOPHORECTOMY (Bilateral ) SENTINEL NODE BIOPSY (N/A )     Patient location during evaluation: PACU Anesthesia Type: General Level of consciousness: awake and alert Pain management: pain level controlled Vital Signs Assessment: post-procedure vital signs reviewed and stable Respiratory status: spontaneous breathing, nonlabored ventilation, respiratory function stable and patient connected to nasal cannula oxygen Cardiovascular status: blood pressure returned to baseline and stable Postop Assessment: no apparent nausea or vomiting Anesthetic complications: no    Last Vitals:  Vitals:   11/02/17 1130 11/02/17 1145  BP: (!) 149/73 (!) 162/77  Pulse: (!) 54 60  Resp: 16 16  Temp:    SpO2: 100% 100%    Last Pain:  Vitals:   11/02/17 1145  TempSrc:   PainSc: 6                  Eryck Negron S

## 2017-11-02 NOTE — Anesthesia Preprocedure Evaluation (Addendum)
Anesthesia Evaluation  Patient identified by MRN, date of birth, ID band Patient awake    Reviewed: Allergy & Precautions, NPO status , Patient's Chart, lab work & pertinent test results  Airway Mallampati: II  TM Distance: <3 FB Neck ROM: Full    Dental no notable dental hx. (+) Upper Dentures, Partial Lower, Dental Advisory Given   Pulmonary neg pulmonary ROS,    Pulmonary exam normal breath sounds clear to auscultation       Cardiovascular + dysrhythmias Atrial Fibrillation  Rhythm:Irregular Rate:Normal     Neuro/Psych negative neurological ROS  negative psych ROS   GI/Hepatic negative GI ROS, Neg liver ROS,   Endo/Other  Morbid obesity  Renal/GU negative Renal ROS  negative genitourinary   Musculoskeletal negative musculoskeletal ROS (+)   Abdominal   Peds negative pediatric ROS (+)  Hematology negative hematology ROS (+)   Anesthesia Other Findings   Reproductive/Obstetrics negative OB ROS                            Anesthesia Physical Anesthesia Plan  ASA: III  Anesthesia Plan: General   Post-op Pain Management:    Induction: Intravenous  PONV Risk Score and Plan: 3 and Ondansetron, Dexamethasone and Treatment may vary due to age or medical condition  Airway Management Planned: Oral ETT  Additional Equipment:   Intra-op Plan:   Post-operative Plan: Extubation in OR  Informed Consent: I have reviewed the patients History and Physical, chart, labs and discussed the procedure including the risks, benefits and alternatives for the proposed anesthesia with the patient or authorized representative who has indicated his/her understanding and acceptance.   Dental advisory given  Plan Discussed with: CRNA and Surgeon  Anesthesia Plan Comments:         Anesthesia Quick Evaluation

## 2017-11-02 NOTE — Op Note (Signed)
OPERATIVE NOTE 11/02/17  Surgeon: Donaciano Eva   Assistants: Dr Lahoma Crocker (an MD assistant was necessary for tissue manipulation, management of robotic instrumentation, retraction and positioning due to the complexity of the case and hospital policies).   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  Estimated Blood Loss:  less than 50 mL      Total IV Fluids: 800 ml         Specimens: uterus, cervix, bilateral tubes and ovaries, right obturator and external iliac SLN, left external iliac SLN.         Complications:  None; patient tolerated the procedure well.         Disposition: PACU - hemodynamically stable.  Procedure Details  The patient was seen in the Holding Room. The risks, benefits, complications, treatment options, and expected outcomes were discussed with the patient.  The patient concurred with the proposed plan, giving informed consent.  The site of surgery properly noted/marked. The patient was identified as Helen West and the procedure verified as a Robotic-assisted hysterectomy with bilateral salpingo oophorectomy with SLN biopsy. A Time Out was held and the above information confirmed.  After induction of anesthesia, the patient was draped and prepped in the usual sterile manner. Pt was placed in supine position after anesthesia and draped and prepped in the usual sterile manner. The abdominal drape was placed after the CholoraPrep had been allowed to dry for 3 minutes.  Her arms were  tucked to her side with all appropriate precautions.  The shoulders were stabilized with padded shoulder blocks applied to the acromium processes.  The patient was placed in the semi-lithotomy position in [place].  The perineum was prepped with Betadine. The patient was then prepped. Foley catheter was placed.  A sterile speculum was placed in the vagina.  The cervix was grasped with a single-tooth tenaculum. 2mg  total of ICG was injected into the cervical stroma at 2 and 9 o'clock with 1cc injected at a 1cm and 54mm depth (concentration 0.5mg /ml) in all locations. The cervix was dilated with Kennon Rounds dilators.  The ZUMI uterine manipulator with a medium colpotomizer ring was placed without difficulty.  A pneum occluder balloon was placed over the manipulator.  OG tube placement was confirmed and to suction.   Next, a 5 mm skin incision was made 1 cm below the subcostal margin in the midclavicular line.  The 5 mm Optiview port and scope was used for direct entry.  Opening pressure was under 10 mm CO2.  The abdomen was insufflated and the findings were noted as above.   At this point and all points during the procedure, the patient's intra-abdominal pressure did not exceed 15 mmHg. Next, a 10 mm skin incision was made in the umbilicus and a right and left port was placed about 10 cm lateral to the robot port on the right and left side.  A fourth arm was placed in the left lower quadrant 2 cm above and superior and medial to the anterior superior iliac spine.  All ports were placed under direct visualization.  The patient was placed in steep Trendelenburg.  Bowel  was folded away into the upper abdomen.  The robot was docked in the normal manner.  The right and left peritoneum were opened parallel to the IP ligament to open the retroperitoneal spaces bilaterally. The SLN mapping was performed in bilateral pelvic basins. The para rectal and paravesical spaces were opened up entirely with careful dissection below  the level of the ureters bilaterally and to the depth of the uterine artery origin in order to skeletonize the uterine "web" and ensure visualization of all parametrial channels. The para-aortic basins were carefully exposed and evaluated for isolated para-aortic SLN's. Lymphatic channels were identified travelling to the following visualized sentinel lymph node's: right obturator and external iliac, left external ilac. These SLN's were separated from their surrounding lymphatic tissue, removed and sent for permanent pathology.  The hysterectomy was started after the round ligament on the right side was incised and the retroperitoneum was entered and the pararectal space was developed.  The ureter was noted to be on the medial leaf of the broad ligament.  The peritoneum above the ureter was incised and stretched and the infundibulopelvic ligament was skeletonized, cauterized and cut.  The posterior peritoneum was taken down to the level of the KOH ring.  The anterior peritoneum was also taken down.  The bladder flap was created to the level of the KOH ring.  The uterine artery on the right side was skeletonized, cauterized and cut in the normal manner.  A similar procedure was performed on the left.  The colpotomy was made and the uterus, cervix, bilateral ovaries and tubes were amputated and delivered through the vagina with some difficulty due to large uterine size and narrow introitus.  Pedicles were inspected and excellent hemostasis was achieved.    The colpotomy at the vaginal cuff was closed with Vicryl on a CT1 needle in a running manner.  Irrigation was used and excellent hemostasis was achieved.  At this point in the procedure was completed.  Robotic instruments were removed under direct visulaization.  The robot was undocked. The 10 mm ports were closed with Vicryl on a UR-5 needle and the fascia was closed with 0 Vicryl on a UR-5 needle.  The skin was closed with 4-0 Vicryl in a subcuticular manner.   Dermabond was applied.  Sponge, lap and needle counts correct x 2.  The patient was taken to the recovery room in stable condition.  The vagina was swabbed with bleeding noted from the posterior introitus and lateral sidewalls. These sites were made hemostatic with 3-0 vicryl suture. All instrument and needle counts were correct x  3.   The patient was transferred to the recovery room in a stable condition.  Donaciano Eva, MD

## 2017-11-02 NOTE — Interval H&P Note (Signed)
History and Physical Interval Note:  11/02/2017 7:22 AM  Helen West  has presented today for surgery, with the diagnosis of complex atypical hyperplasia  The various methods of treatment have been discussed with the patient and family. After consideration of risks, benefits and other options for treatment, the patient has consented to  Procedure(s): XI ROBOTIC ASSISTED TOTAL HYSTERECTOMY WITH BILATERAL SALPINGO OOPHORECTOMY (Bilateral) SENTINEL NODE BIOPSY (N/A) as a surgical intervention .  The patient's history has been reviewed, patient examined, no change in status, stable for surgery.  I have reviewed the patient's chart and labs.  Questions were answered to the patient's satisfaction.     Thereasa Solo

## 2017-11-02 NOTE — Anesthesia Procedure Notes (Signed)
Procedure Name: Intubation Date/Time: 11/02/2017 7:42 AM Performed by: Wanita Chamberlain, CRNA Pre-anesthesia Checklist: Patient identified, Emergency Drugs available, Suction available, Patient being monitored and Timeout performed Patient Re-evaluated:Patient Re-evaluated prior to induction Oxygen Delivery Method: Circle system utilized Preoxygenation: Pre-oxygenation with 100% oxygen Induction Type: IV induction Ventilation: Mask ventilation without difficulty Laryngoscope Size: Mac, Glidescope and 3 Grade View: Grade I Tube type: Oral Tube size: 7.0 mm Number of attempts: 1 Airway Equipment and Method: Rigid stylet Placement Confirmation: breath sounds checked- equal and bilateral,  CO2 detector,  positive ETCO2 and ETT inserted through vocal cords under direct vision Secured at: 21 cm Tube secured with: Tape Dental Injury: Teeth and Oropharynx as per pre-operative assessment  Difficulty Due To: Difficulty was anticipated Comments: Pt reported having lost her voice for several days after her hip replacement surgery. Concerned about throat.  Pt does measure 2 FB to TM so elected to use Glidescope initially. Great view and easy airway.

## 2017-11-02 NOTE — Transfer of Care (Signed)
Immediate Anesthesia Transfer of Care Note  Patient: Helen West  Procedure(s) Performed: XI ROBOTIC ASSISTED TOTAL HYSTERECTOMY WITH BILATERAL SALPINGO OOPHORECTOMY (Bilateral ) SENTINEL NODE BIOPSY (N/A )  Patient Location: PACU  Anesthesia Type:General  Level of Consciousness: awake, alert , oriented and patient cooperative  Airway & Oxygen Therapy: Patient Spontanous Breathing and Patient connected to face mask oxygen  Post-op Assessment: Report given to RN and Post -op Vital signs reviewed and stable  Post vital signs: Reviewed and stable  Last Vitals:  Vitals Value Taken Time  BP 164/87 11/02/2017  9:52 AM  Temp    Pulse 58 11/02/2017  9:54 AM  Resp 19 11/02/2017  9:54 AM  SpO2 100 % 11/02/2017  9:54 AM  Vitals shown include unvalidated device data.  Last Pain:  Vitals:   11/02/17 0610  TempSrc:   PainSc: 0-No pain      Patients Stated Pain Goal: 4 (35/59/[id number])  Complications: No apparent anesthesia complications

## 2017-11-03 ENCOUNTER — Encounter (HOSPITAL_COMMUNITY): Payer: Self-pay | Admitting: Gynecologic Oncology

## 2017-11-03 DIAGNOSIS — C541 Malignant neoplasm of endometrium: Secondary | ICD-10-CM | POA: Diagnosis not present

## 2017-11-03 LAB — BASIC METABOLIC PANEL
Anion gap: 8 (ref 5–15)
BUN: 17 mg/dL (ref 8–23)
CALCIUM: 8.8 mg/dL — AB (ref 8.9–10.3)
CO2: 29 mmol/L (ref 22–32)
CREATININE: 0.77 mg/dL (ref 0.44–1.00)
Chloride: 105 mmol/L (ref 98–111)
GFR calc Af Amer: >60 mL/min (ref >60)
GFR calc non Af Amer: >60 mL/min (ref >60)
GLUCOSE: 126 mg/dL — AB (ref 70–99)
Potassium: 5.2 mmol/L — ABNORMAL HIGH (ref 3.5–5.1)
Sodium: 142 mmol/L (ref 135–145)

## 2017-11-03 LAB — CBC
HEMATOCRIT: 39 % (ref 36.0–46.0)
Hemoglobin: 12.5 g/dL (ref 12.0–15.0)
MCH: 30.9 pg (ref 26.0–34.0)
MCHC: 32.1 g/dL (ref 30.0–36.0)
MCV: 96.5 fL (ref 78.0–100.0)
Platelets: 285 10*3/uL (ref 150–400)
RBC: 4.04 MIL/uL (ref 3.87–5.11)
RDW: 13.5 % (ref 11.5–15.5)
WBC: 8.6 10*3/uL (ref 4.0–10.5)

## 2017-11-03 MED ORDER — TRAMADOL HCL 50 MG PO TABS: 50.0000 mg | ORAL_TABLET | Freq: Four times a day (QID) | ORAL | 0 refills | Status: DC | PRN

## 2017-11-03 MED ORDER — SENNOSIDES-DOCUSATE SODIUM 8.6-50 MG PO TABS: 2.0000 | ORAL_TABLET | Freq: Every day | ORAL | 1 refills | Status: DC

## 2017-11-03 NOTE — Progress Notes (Signed)
Discharge instructions reviewed with patient. All questions answered. Patient wheeled to vehicle with belongings by nurse tech. 

## 2017-11-03 NOTE — Discharge Summary (Signed)
Physician Discharge Summary  Patient ID: Helen West MRN: [id number] DOB/AGE: 1955/11/01 62 y.o.  Admit date: 11/02/2017 Discharge date: 11/03/2017  Admission Diagnoses: Endometrial cancer Helen West)  Discharge Diagnoses:  Principal Problem:   Endometrial cancer Helen West) Active Problems:   Complex endometrial hyperplasia with atypia   Discharged Condition:  The patient is in good condition and stable for discharge.    West Course: On 11/02/2017, the patient underwent the following: Procedure(s): XI ROBOTIC ASSISTED TOTAL HYSTERECTOMY WITH BILATERAL SALPINGO OOPHORECTOMY SENTINEL NODE BIOPSY.  The postoperative course was uneventful.  She was discharged to home on postoperative day 1 tolerating a regular diet, voiding, ambulating, no pain reported, passing flatus.  She is resume her xarelto 2 weeks after surgery.  Consults: None  Significant Diagnostic Studies: None  Treatments: surgery: see above  Discharge Exam: Blood pressure (!) 119/56, pulse (!) 53, temperature 97.6 F (36.4 C), temperature source Oral, resp. rate 16, height 5\' 2"  (1.575 m), weight 248 lb (112.5 kg), SpO2 96 %. General appearance: alert, cooperative, no distress and reporting mild headache but no vision changes Resp: clear to auscultation bilaterally Cardio: bradycardic (pt's norm), regular rhythm GI: soft, non-tender; bowel sounds normal; no masses,  no organomegaly and abdomen obese Extremities: extremities normal, atraumatic, no cyanosis or edema Incision/Wound: Lap sites with dermabond without erythema or drainage.  Dressing over umbilicus incision removed with old blood present.  No active bleeding or drainage noted.  Disposition: Discharge disposition: 01-Home or Self Care       Discharge Instructions    Call MD for:  difficulty breathing, headache or visual disturbances   Complete by:  As directed    Call MD for:  extreme fatigue   Complete by:  As directed    Call MD for:  hives   Complete  by:  As directed    Call MD for:  persistant dizziness or light-headedness   Complete by:  As directed    Call MD for:  persistant nausea and vomiting   Complete by:  As directed    Call MD for:  redness, tenderness, or signs of infection (pain, swelling, redness, odor or green/yellow discharge around incision site)   Complete by:  As directed    Call MD for:  severe uncontrolled pain   Complete by:  As directed    Call MD for:  temperature >100.4   Complete by:  As directed    Diet - low sodium heart healthy   Complete by:  As directed    Discharge instructions   Complete by:  As directed    Do not take Xarelto for 2 weeks after surgery   Driving Restrictions   Complete by:  As directed    No driving for 1 week.  Do not take narcotics and drive.   Increase activity slowly   Complete by:  As directed    Lifting restrictions   Complete by:  As directed    No lifting greater than 10 lbs.   Sexual Activity Restrictions   Complete by:  As directed    No sexual activity, nothing in the vagina, for 8 weeks.     Allergies as of 11/03/2017   No Known Allergies     Medication List    TAKE these medications   acetaminophen 650 MG CR tablet Commonly known as:  TYLENOL Take 1,950 mg by mouth daily. Patient takes 3 tablets daily in the morning to equal a 1950mg  a day.(Per Patient)   alendronate 70 MG tablet  Commonly known as:  FOSAMAX Take 70 mg by mouth once a week. Take with a full glass of water on an empty stomach.   atorvastatin 10 MG tablet Commonly known as:  LIPITOR Take 10 mg by mouth daily.   flecainide 100 MG tablet Commonly known as:  TAMBOCOR Take 100 mg by mouth 2 (two) times daily.   metoprolol tartrate 25 MG tablet Commonly known as:  LOPRESSOR Take 12.5 mg by mouth 2 (two) times daily.   OVER THE COUNTER MEDICATION Take 1 tablet by mouth daily. Mega Red 750mg  tablet once daily.   rivaroxaban 20 MG Tabs tablet Commonly known as:  XARELTO Take 1 tablet  (20 mg total) by mouth daily with supper. Start taking on:  11/16/2017 What changed:  These instructions start on 11/16/2017. If you are unsure what to do until then, ask your doctor or other care provider.   senna-docusate 8.6-50 MG tablet Commonly known as:  Senokot-S Take 2 tablets by mouth at bedtime. Do not take if having loose stools   traMADol 50 MG tablet Commonly known as:  ULTRAM Take 1 tablet (50 mg total) by mouth every 6 (six) hours as needed for severe pain.      Follow-up Information    Helen Amber, MD Follow up on 11/25/2017.   Specialty:  Gynecologic Oncology Why:  at 12:15pm at the Helen West information: Helen West [id number] [phone]           Greater than thirty minutes were spend for face to face discharge instructions and discharge orders/summary in EPIC.   Signed: [name] 11/03/2017, 8:32 AM

## 2017-11-03 NOTE — Addendum Note (Signed)
Addendum  created 11/03/17 0935 by Lollie Sails, CRNA   Charge Capture section accepted

## 2017-11-03 NOTE — Discharge Instructions (Signed)
11/02/2017  Return to work: 4 weeks  Activity: 1. Be up and out of the bed during the day.  Take a nap if needed.  You may walk up steps but be careful and use the hand rail.  Stair climbing will tire you more than you think, you may need to stop part way and rest.   2. No lifting or straining for 6 weeks.  3. No driving for 1 weeks.  Do Not drive if you are taking narcotic pain medicine.  4. Shower daily.  Use soap and water on your incision and pat dry; don't rub.   5. No sexual activity and nothing in the vagina for 8 weeks.  Medications:  - Take tylenol first line for pain control. Take these regularly (every 6 hours) to decrease the build up of pain.  - If necessary, for severe pain not relieved by tylenol, use tramadol.  - While taking tramadol you should take sennakot every night to reduce the likelihood of constipation. If this causes diarrhea, stop its use.  - DO NOT START RETAKING YOUR BLOOD THINNER (xarelto) for 2 weeks postop (11/15/17).  Diet: 1. Low sodium Heart Healthy Diet is recommended.  2. It is safe to use a laxative if you have difficulty moving your bowels.   Wound Care: 1. Keep clean and dry.  Shower daily.  Reasons to call the Doctor:   Fever - Oral temperature greater than 100.4 degrees Fahrenheit  Foul-smelling vaginal discharge  Difficulty urinating  Nausea and vomiting  Increased pain at the site of the incision that is unrelieved with pain medicine.  Difficulty breathing with or without chest pain  New calf pain especially if only on one side  Sudden, continuing increased vaginal bleeding with or without clots.   Follow-up: 1. See [name] in 3-4 weeks.  Contacts: For questions or concerns you should contact:  Dr. [name] at [phone] After hours and on week-ends call [phone] and ask to speak to the physician on call for Gynecologic Oncology  Tramadol tablets What is this medicine? TRAMADOL (TRA ma dole) is a pain  reliever. It is used to treat moderate to severe pain in adults. This medicine may be used for other purposes; ask your health care provider or pharmacist if you have questions. COMMON BRAND NAME(S): Ultram What should I tell my health care provider before I take this medicine? They need to know if you have any of these conditions: -brain tumor -depression -drug abuse or addiction -head injury -if you frequently drink alcohol containing drinks -kidney disease or trouble passing urine -liver disease -lung disease, asthma, or breathing problems -seizures or epilepsy -suicidal thoughts, plans, or attempt; a previous suicide attempt by you or a family member -an unusual or allergic reaction to tramadol, codeine, other medicines, foods, dyes, or preservatives -pregnant or trying to get pregnant -breast-feeding How should I use this medicine? Take this medicine by mouth with a full glass of water. Follow the directions on the prescription label. You can take it with or without food. If it upsets your stomach, take it with food. Do not take your medicine more often than directed. A special MedGuide will be given to you by the pharmacist with each prescription and refill. Be sure to read this information carefully each time. Talk to your pediatrician regarding the use of this medicine in children. Special care may be needed. Overdosage: If you think you have taken too much of this medicine contact a poison control  center or emergency room at once. NOTE: This medicine is only for you. Do not share this medicine with others. What if I miss a dose? If you miss a dose, take it as soon as you can. If it is almost time for your next dose, take only that dose. Do not take double or extra doses. What may interact with this medicine? Do not take this medication with any of the following medicines: -MAOIs like Carbex, Eldepryl, Marplan, Nardil, and Parnate This medicine may also interact with the  following medications: -alcohol -antihistamines for allergy, cough and cold -certain medicines for anxiety or sleep -certain medicines for depression like amitriptyline, fluoxetine, sertraline -certain medicines for migraine headache like almotriptan, eletriptan, frovatriptan, naratriptan, rizatriptan, sumatriptan, zolmitriptan -certain medicines for seizures like carbamazepine, oxcarbazepine, phenobarbital, primidone -certain medicines that treat or prevent blood clots like warfarin -digoxin -furazolidone -general anesthetics like halothane, isoflurane, methoxyflurane, propofol -linezolid -local anesthetics like lidocaine, pramoxine, tetracaine -medicines that relax muscles for surgery -other narcotic medicines for pain or cough -phenothiazines like chlorpromazine, mesoridazine, prochlorperazine, thioridazine -procarbazine This list may not describe all possible interactions. Give your health care provider a list of all the medicines, herbs, non-prescription drugs, or dietary supplements you use. Also tell them if you smoke, drink alcohol, or use illegal drugs. Some items may interact with your medicine. What should I watch for while using this medicine? Tell your doctor or health care professional if your pain does not go away, if it gets worse, or if you have new or a different type of pain. You may develop tolerance to the medicine. Tolerance means that you will need a higher dose of the medicine for pain relief. Tolerance is normal and is expected if you take this medicine for a long time. Do not suddenly stop taking your medicine because you may develop a severe reaction. Your body becomes used to the medicine. This does NOT mean you are addicted. Addiction is a behavior related to getting and using a drug for a non-medical reason. If you have pain, you have a medical reason to take pain medicine. Your doctor will tell you how much medicine to take. If your doctor wants you to stop the  medicine, the dose will be slowly lowered over time to avoid any side effects. There are different types of narcotic medicines (opiates). If you take more than one type at the same time or if you are taking another medicine that also causes drowsiness, you may have more side effects. Give your health care provider a list of all medicines you use. Your doctor will tell you how much medicine to take. Do not take more medicine than directed. Call emergency for help if you have problems breathing or unusual sleepiness. You may get drowsy or dizzy. Do not drive, use machinery, or do anything that needs mental alertness until you know how this medicine affects you. Do not stand or sit up quickly, especially if you are an older patient. This reduces the risk of dizzy or fainting spells. Alcohol can increase or decrease the effects of this medicine. Avoid alcoholic drinks. You may have constipation. Try to have a bowel movement at least every 2 to 3 days. If you do not have a bowel movement for 3 days, call your doctor or health care professional. Your mouth may get dry. Chewing sugarless gum or sucking hard candy, and drinking plenty of water may help. Contact your doctor if the problem does not go away or is severe. What side  effects may I notice from receiving this medicine? Side effects that you should report to your doctor or health care professional as soon as possible: -allergic reactions like skin rash, itching or hives, swelling of the face, lips, or tongue -breathing problems -confusion -seizures -signs and symptoms of low blood pressure like dizziness; feeling faint or lightheaded, falls; unusually weak or tired -trouble passing urine or change in the amount of urine Side effects that usually do not require medical attention (report to your doctor or health care professional if they continue or are bothersome): -constipation -dry mouth -nausea, vomiting -tiredness This list may not describe all  possible side effects. Call your doctor for medical advice about side effects. You may report side effects to FDA at 1-800-FDA-1088. Where should I keep my medicine? Keep out of the reach of children. This medicine may cause accidental overdose and death if it taken by other adults, children, or pets. Mix any unused medicine with a substance like cat litter or coffee grounds. Then throw the medicine away in a sealed container like a sealed bag or a coffee can with a lid. Do not use the medicine after the expiration date. Store at room temperature between 15 and 30 degrees C (59 and 86 degrees F). NOTE: This sheet is a summary. It may not cover all possible information. If you have questions about this medicine, talk to your doctor, pharmacist, or health care provider.  2018 Elsevier/Gold Standard (2014-10-14 09:00:04)

## 2017-11-03 NOTE — Addendum Note (Signed)
Addendum  created 11/03/17 1023 by Wanita Chamberlain, CRNA   Charge Capture section accepted

## 2017-11-09 ENCOUNTER — Telehealth: Payer: Self-pay | Admitting: Gynecologic Oncology

## 2017-11-09 ENCOUNTER — Other Ambulatory Visit: Payer: Self-pay | Admitting: Gynecologic Oncology

## 2017-11-09 DIAGNOSIS — C541 Malignant neoplasm of endometrium: Secondary | ICD-10-CM

## 2017-11-09 NOTE — Progress Notes (Signed)
Referral to rad/onc

## 2017-11-09 NOTE — Telephone Encounter (Signed)
Post op telephone call to check patient status.  Patient describes expected post operative status.  Adequate PO intake reported.  Bowels and bladder functioning without difficulty.  Pain minimal.  Reportable signs and symptoms reviewed.  Final path discussed along with vaginal brachytherapy. No vaginal spotting reported.  Patient agreeable with meeting with Dr. Sondra Come.  Advised that the appt would be made and she would be contacted.  No questions voiced.  Advised to call for any needs or concerns.

## 2017-11-10 ENCOUNTER — Encounter: Payer: Self-pay | Admitting: Radiation Oncology

## 2017-11-10 NOTE — Progress Notes (Signed)
GYN Location of Tumor / Histology: endometrial adenocarcinoma  Helen West presented with symptoms of: management of complex atypical hyperplasia with papillary features.  The patient presented with postmenopausal bleeding and underwent an ultrasound showing an endometrial stripe of 21 mm.  An endometrial biopsy was obtained on October 13, 2017.  Biopsies revealed: 11/02/17:  Diagnosis 1. Lymph node, sentinel, biopsy, Right Obturator - LYMPH NODE, NEGATIVE FOR CARCINOMA (0/1). - PENDING IHC. FOR PANKERATIN. 2. Lymph nodes, regional resection, Right External Iliac - LYMPH NODE, NEGATIVE FOR CARCINOMA (0/1). - PENDING IHC. FOR PANKERATIN. 3. Lymph nodes, regional resection, Left External Iliac - LYMPH NODE, NEGATIVE FOR CARCINOMA (0/1). - PENDING IHC. FOR PANKERATIN. 4. Uterus +/- tubes/ovaries, neoplastic - INVASIVE ENDOMETRIOID ADENOCARCINOMA, 5.5 CM, FIGO GRADE I, INVOLVING POSTERIOR UTERINE WALL AND FUNDUS. - CARCINOMA INVADES FOR A DEPTH OF 1.6 CM WHERE MYOMETRIAL THICKNESS IS 1.9 CM (GREATER THAN 50% INVASION). - LYMPHOVASCULAR INVASION IS PRESENT. - BENIGN LEIOMYOMA, 4 CM. - BENIGN UNREMARKABLE BILATERAL FALLOPIAN TUBES AND OVARIES. - BENIGN UNREMARKABLE CERVIX - SEE ONCOLOGY TABLE. - SEE NOTE.  Past/Anticipated interventions by Gyn/Onc surgery, if any: 11/02/17: Operation: Robotic-assisted laparoscopic total hysterectomy with bilateral salpingoophorectomy, SLN biopsy   Surgeon: Donaciano Eva  Past/Anticipated interventions by medical oncology, if any: None at this time.   Weight changes, if any: Pt has been trying to lose weight.  Bowel/Bladder complaints, if any: No  Nausea/Vomiting, if any: No. Pt c/o slight abdominal bloating attributed to surgery.  Pain issues, if any:  No  SAFETY ISSUES:  Prior radiation? No  Pacemaker/ICD? No  Possible current pregnancy? N/A, pt is post surgical  Is the patient on methotrexate? No  Current Complaints / other  details:  Pt presents today for initial consult with Dr. Sondra Come. Pt is accompanied by her husband.   BP (!) 159/74 (BP Location: Right Arm, Patient Position: Sitting)   Pulse (!) 49   Temp 98.2 F (36.8 C) (Oral)   Resp 18   Ht 5\' 3"  (1.6 m)   Wt 249 lb 6 oz (113.1 kg)   SpO2 100%   BMI 44.17 kg/m   Loma Sousa, RN BSN

## 2017-11-15 ENCOUNTER — Encounter: Payer: Self-pay | Admitting: Radiation Oncology

## 2017-11-15 ENCOUNTER — Encounter: Payer: Self-pay | Admitting: Oncology

## 2017-11-15 ENCOUNTER — Other Ambulatory Visit: Payer: Self-pay

## 2017-11-15 ENCOUNTER — Ambulatory Visit
Admission: RE | Admit: 2017-11-15 | Discharge: 2017-11-15 | Disposition: A | Discharge status: Home / Self Care | Payer: PPO | Source: Ambulatory Visit | Attending: Radiation Oncology | Admitting: Radiation Oncology

## 2017-11-15 VITALS — BP 159/74 | HR 49 | Temp 98.2°F | Resp 18 | Ht 63.0 in | Wt 249.4 lb

## 2017-11-15 DIAGNOSIS — I480 Paroxysmal atrial fibrillation: Secondary | ICD-10-CM | POA: Diagnosis not present

## 2017-11-15 DIAGNOSIS — C541 Malignant neoplasm of endometrium: Secondary | ICD-10-CM

## 2017-11-15 DIAGNOSIS — Z7901 Long term (current) use of anticoagulants: Secondary | ICD-10-CM | POA: Insufficient documentation

## 2017-11-15 DIAGNOSIS — Z90711 Acquired absence of uterus with remaining cervical stump: Secondary | ICD-10-CM | POA: Diagnosis not present

## 2017-11-15 DIAGNOSIS — I4891 Unspecified atrial fibrillation: Secondary | ICD-10-CM | POA: Diagnosis not present

## 2017-11-15 DIAGNOSIS — C569 Malignant neoplasm of unspecified ovary: Secondary | ICD-10-CM | POA: Diagnosis present

## 2017-11-15 DIAGNOSIS — Z79899 Other long term (current) drug therapy: Secondary | ICD-10-CM | POA: Insufficient documentation

## 2017-11-15 DIAGNOSIS — Z90710 Acquired absence of both cervix and uterus: Secondary | ICD-10-CM | POA: Diagnosis not present

## 2017-11-15 DIAGNOSIS — N950 Postmenopausal bleeding: Secondary | ICD-10-CM | POA: Diagnosis not present

## 2017-11-15 DIAGNOSIS — M1990 Unspecified osteoarthritis, unspecified site: Secondary | ICD-10-CM | POA: Insufficient documentation

## 2017-11-15 NOTE — Progress Notes (Signed)
Radiation Oncology         [phone] ________________________________  Initial Outpatient Consultation  Name: Helen West MRN: [id number]  Date: 11/15/2017  DOB: 1955/08/19  CC:[name], [name], MD  [name], MD   REFERRING PHYSICIAN: [name], MD  DIAGNOSIS: Stage IB (pT1b, pN0) Invasive Endometrioid Adenocarcinoma, Grade 1  HISTORY OF PRESENT ILLNESS::Helen West is a 62 y.o. female who is accompanied by her husband. The patient presented to Dr. Marvel Plan with postmenopausal bleeding lasting 2-3 months and underwent an ultrasound showing an endometrial stripe of 21 mm. She underwent endometrium biopsy on 10/13/17 which revealed complex atypical hyperplasia with papillary features.    The patient underwent hysterectomy on 11/02/17 showing: invasive endometrioid adenocarcinoma, 5.5 cm, FIGO grade 1, involving posterior uterine wall and fundus (greater than 50% invasion), and lymphovascular invasion. All 3 sentinel lymph nodes were negative.  A. Fib controlled with medications at this time.  PREVIOUS RADIATION THERAPY: No  PAST MEDICAL HISTORY:  has a past medical history of A-fib ([place]), Anemia, Arthritis, Cancer ([place]), Complication of anesthesia, Dysrhythmia, Headache, High cholesterol, Obesity, and Post-menopausal bleeding.    PAST SURGICAL HISTORY: Past Surgical History:  Procedure Laterality Date  . ROBOTIC ASSISTED TOTAL HYSTERECTOMY WITH BILATERAL SALPINGO OOPHERECTOMY Bilateral 11/02/2017   Procedure: XI ROBOTIC ASSISTED TOTAL HYSTERECTOMY WITH BILATERAL SALPINGO OOPHORECTOMY;  Surgeon: [name], MD;  Location: [facility];  Service: Gynecology;  Laterality: Bilateral;  . SENTINEL NODE BIOPSY N/A 11/02/2017   Procedure: SENTINEL NODE BIOPSY;  Surgeon: [name], MD;  Location: [facility];  Service: Gynecology;  Laterality: N/A;  . TONSILLECTOMY    . TOTAL HIP ARTHROPLASTY    . TUBAL LIGATION      FAMILY HISTORY: family history includes Aneurysm in her mother; Cancer  in her paternal uncle; Emphysema in her father.  SOCIAL HISTORY:  reports that she has never smoked. She has never used smokeless tobacco. She reports that she drinks alcohol. She reports that she does not use drugs.  ALLERGIES: Patient has no known allergies.  MEDICATIONS:  Current Outpatient Medications  Medication Sig Dispense Refill  . acetaminophen (TYLENOL) 650 MG CR tablet Take 1,950 mg by mouth daily. Patient takes 3 tablets daily in the morning to equal a 1950mg  a day.(Per Patient)     . alendronate (FOSAMAX) 70 MG tablet Take 70 mg by mouth once a week. Take with a full glass of water on an empty stomach.    Marland Kitchen atorvastatin (LIPITOR) 10 MG tablet Take 10 mg by mouth daily.    . flecainide (TAMBOCOR) 100 MG tablet Take 100 mg by mouth 2 (two) times daily.     . metoprolol tartrate (LOPRESSOR) 25 MG tablet Take 12.5 mg by mouth 2 (two) times daily.     Marland Kitchen OVER THE COUNTER MEDICATION Take 1 tablet by mouth daily. Mega Red 750mg  tablet once daily.    Derrill Memo ON 11/16/2017] rivaroxaban (XARELTO) 20 MG TABS tablet Take 1 tablet (20 mg total) by mouth daily with supper.    . senna-docusate (SENOKOT-S) 8.6-50 MG tablet Take 2 tablets by mouth at bedtime. Do not take if having loose stools 60 tablet 1  . traMADol (ULTRAM) 50 MG tablet Take 1 tablet (50 mg total) by mouth every 6 (six) hours as needed for severe pain. 12 tablet 0   No current facility-administered medications for this encounter.     REVIEW OF SYSTEMS: A 10+ POINT REVIEW OF SYSTEMS WAS OBTAINED including neurology, dermatology, psychiatry, cardiac, respiratory, lymph, extremities,  GI, GU, musculoskeletal, constitutional, reproductive, HEENT. She reports postsurgical abdominal bloating. She denies any other symptoms.   PHYSICAL EXAM:  height is 5\' 3"  (1.6 m) and weight is 249 lb 6 oz (113.1 kg). Her oral temperature is 98.2 F (36.8 C). Her blood pressure is 159/74 (abnormal) and her pulse is 49 (abnormal). Her respiration is  18 and oxygen saturation is 100%.   General: Alert and oriented, in no acute distress HEENT: Head is normocephalic. Extraocular movements are intact. Oropharynx is clear. Neck: Neck is supple, no palpable cervical or supraclavicular lymphadenopathy. Heart: Regular in rate and rhythm with no murmurs, rubs, or gallops. Chest: Clear to auscultation bilaterally, with no rhonchi, wheezes, or rales. Abdomen: Soft, nontender, nondistended, with no rigidity or guarding. Extremities: No cyanosis or edema. Lymphatics: see Neck Exam Skin: No concerning lesions. Musculoskeletal: symmetric strength and muscle tone throughout. Neurologic: Cranial nerves II through XII are grossly intact. No obvious focalities. Speech is fluent. Coordination is intact. Psychiatric: Judgment and insight are intact. Affect is appropriate. Pelvic exam deferred in light of recent surgery.  ECOG = 1  0 - Asymptomatic (Fully active, able to carry on all predisease activities without restriction)  1 - Symptomatic but completely ambulatory (Restricted in physically strenuous activity but ambulatory and able to carry out work of a light or sedentary nature. For example, light housework, office work)  2 - Symptomatic, <50% in bed during the day (Ambulatory and capable of all self care but unable to carry out any work activities. Up and about more than 50% of waking hours)  3 - Symptomatic, >50% in bed, but not bedbound (Capable of only limited self-care, confined to bed or chair 50% or more of waking hours)  4 - Bedbound (Completely disabled. Cannot carry on any self-care. Totally confined to bed or chair)  5 - Death   Eustace Pen MM, Creech RH, Tormey DC, et al. [phone]). "Toxicity and response criteria of the [facility]". [place] Oncol. 5 (6): 649-55  LABORATORY DATA:  Lab Results  Component Value Date   WBC 8.6 11/03/2017   HGB 12.5 11/03/2017   HCT 39.0 11/03/2017   MCV 96.5 11/03/2017   PLT 285  11/03/2017   Lab Results  Component Value Date   NA 142 11/03/2017   K 5.2 (H) 11/03/2017   CL 105 11/03/2017   CO2 29 11/03/2017   GLUCOSE 126 (H) 11/03/2017   CREATININE 0.77 11/03/2017   CALCIUM 8.8 (L) 11/03/2017      RADIOGRAPHY: No results found.    IMPRESSION: Stage IB (pT1b, pN0) Invasive Endometrioid Adenocarcinoma, Grade 1  The patient would be at risk for vaginal cuff recurrence given the pathologic findings of deeply invasive tumor and the presence of lymphovascular space invasion. I would agree with Dr. Serita Grit recommendations for vaginal brachytherapy.  Today, I talked to the patient and husband about the findings and work-up thus far.  We discussed the natural history of endometrial cancer and general treatment, highlighting the role of radiotherapy in particular brachytherapy, in the management.  We discussed the available radiation techniques, and focused on the details of logistics and delivery.  We reviewed the anticipated acute and late sequelae associated with radiation in this setting.  The patient was encouraged to ask questions that I answered to the best of my ability.  A patient consent form was discussed and signed.  We retained a copy for our records.  The patient would like to proceed with radiation and will be  scheduled for CT simulation.  PLAN: The patient will be scheduled for vaginal brachytherapy approximately 6-7 weeks after her surgery. Anticipate 5 high-dose-rate treatments with Iridium-192 as the high-dose-rate source.    ------------------------------------------------  Blair Promise, PhD, MD  This document serves as a record of services personally performed by Gery Pray, MD. It was created on his behalf by Wilburn Mylar, a trained medical scribe. The creation of this record is based on the scribe's personal observations and the provider's statements to them. This document has been checked and approved by the attending provider.

## 2017-11-17 ENCOUNTER — Telehealth: Payer: Self-pay | Admitting: *Deleted

## 2017-11-17 NOTE — Telephone Encounter (Signed)
CALLED PATIENT TO INFORM OF NEW HDR [facility], LVM FOR A RETURN CALL 

## 2017-11-25 ENCOUNTER — Encounter: Payer: Self-pay | Admitting: Gynecologic Oncology

## 2017-11-25 ENCOUNTER — Inpatient Hospital Stay: Payer: PPO | Attending: Gynecology | Admitting: Gynecologic Oncology

## 2017-11-25 VITALS — BP 120/70 | HR 54 | Temp 98.2°F | Resp 18 | Ht 63.0 in | Wt 249.0 lb

## 2017-11-25 DIAGNOSIS — Z90722 Acquired absence of ovaries, bilateral: Secondary | ICD-10-CM

## 2017-11-25 DIAGNOSIS — C541 Malignant neoplasm of endometrium: Secondary | ICD-10-CM | POA: Diagnosis not present

## 2017-11-25 DIAGNOSIS — N8502 Endometrial intraepithelial neoplasia [EIN]: Secondary | ICD-10-CM

## 2017-11-25 DIAGNOSIS — Z90710 Acquired absence of both cervix and uterus: Secondary | ICD-10-CM | POA: Insufficient documentation

## 2017-11-25 NOTE — Progress Notes (Signed)
Follow-up Note: Gyn-Onc   Helen West 62 y.o. female  Chief Complaint  Patient presents with  . grade 1 endometrial cancer    post op    Assessment : stage IB grade 1 endometrioid endometrial cancer, s/p staging surgery on 11/02/17.  Plan:  High/intermediate risk factors for recurrence. Recommendation is for vaginal brachytherapy to reduce risk for local recurrence in accordance with NCCN guidelines.  I discussed this with the patient. I discussed the role of adjuvant therapy. I discussed prognosis and risk for recurrence. We reviewed symptoms concerning for recurrence and she will see me if these develop prior to her scheduled appointment.  After completing adjuvant therapy I recommend she follow-up at 3 monthly intervals for symptom review, physical examination and pelvic examination. Pap smear is not recommended in routine endometrial cancer surveillance. After 2 years we will space these visits to every 6 months, and then annually if recurrence has not developed within 5 years. All questions were answered.  HPI: 62 year old white married female gravida 3 seen in consultation request of Dr. Paula Compton regarding management of complex atypical hyperplasia with papillary features.  The patient presented with postmenopausal bleeding and underwent an ultrasound showing an endometrial stripe of 21 mm.  An endometrial biopsy was obtained on October 13, 2017.  Obstetrical history gravida 3.  Patient had a postpartum tubal ligation following her second child.  She has had no other abdominal or pelvic surgery.  She has had a right total hip replacement.  She has atrial fibrillation and is on Xarelto and metoprolol.  Interval Hx: On 11/02/17 she underwent robotic assisted total hysterectomy, BSO, SLN biopsy. Final pathology revealed a 5.5cm grade 1 endometrioid adenocarcinoma with 16 of 58mm myometrial invasion and LVSI present. Nodes,adnexa and cervix were negative.  Due to  high/intermediate risk factors she was recommended to have adjuvant vaginal brachytherapy for local control.   Review of Systems:10 point review of systems is negative except as noted in interval history.   Vitals: Blood pressure 120/70, pulse (!) 54, temperature 98.2 F (36.8 C), temperature source Oral, resp. rate 18, height 5\' 3"  (1.6 m), weight 249 lb (112.9 kg), SpO2 99 %.  Physical Exam: General : The patient is a healthy woman in no acute distress.  HEENT: normocephalic, extraoccular movements normal; neck is supple without thyromegally  Lynphnodes: Supraclavicular and inguinal nodes not enlarged  Abdomen: Obese soft, non-tender, no ascites, no organomegally, no masses, no hernias  Pelvic:  Vaginal cuff normal with suture material present, no bleeding, no masses Rectal: deferred Lower extremities: No edema or varicosities. Normal range of motion      No Known Allergies  Past Medical History:  Diagnosis Date  . A-fib ([place])   . Anemia   . Arthritis   . Cancer ([place])   . Complication of anesthesia    Loss of vocal/voice x 2 months  . Dysrhythmia   . Headache   . High cholesterol   . Obesity   . Post-menopausal bleeding     Past Surgical History:  Procedure Laterality Date  . ROBOTIC ASSISTED TOTAL HYSTERECTOMY WITH BILATERAL SALPINGO OOPHERECTOMY Bilateral 11/02/2017   Procedure: XI ROBOTIC ASSISTED TOTAL HYSTERECTOMY WITH BILATERAL SALPINGO OOPHORECTOMY;  Surgeon: [name], MD;  Location: [facility];  Service: Gynecology;  Laterality: Bilateral;  . SENTINEL NODE BIOPSY N/A 11/02/2017   Procedure: SENTINEL NODE BIOPSY;  Surgeon: [name], MD;  Location: [facility];  Service: Gynecology;  Laterality: N/A;  . TONSILLECTOMY    . TOTAL HIP  ARTHROPLASTY    . TUBAL LIGATION      Current Outpatient Medications  Medication Sig Dispense Refill  . acetaminophen (TYLENOL) 650 MG CR tablet Take 1,950 mg by mouth daily. Patient takes 3 tablets daily in the morning to equal a  1950mg  a day.(Per Patient)     . alendronate (FOSAMAX) 70 MG tablet Take 70 mg by mouth once a week. Take with a full glass of water on an empty stomach.    Marland Kitchen atorvastatin (LIPITOR) 10 MG tablet Take 10 mg by mouth daily.    . flecainide (TAMBOCOR) 100 MG tablet Take 100 mg by mouth 2 (two) times daily.     . metoprolol tartrate (LOPRESSOR) 25 MG tablet Take 12.5 mg by mouth 2 (two) times daily.     Marland Kitchen OVER THE COUNTER MEDICATION Take 1 tablet by mouth daily. Mega Red 750mg  tablet once daily.    . rivaroxaban (XARELTO) 20 MG TABS tablet Take 1 tablet (20 mg total) by mouth daily with supper.    . senna-docusate (SENOKOT-S) 8.6-50 MG tablet Take 2 tablets by mouth at bedtime. Do not take if having loose stools 60 tablet 1  . traMADol (ULTRAM) 50 MG tablet Take 1 tablet (50 mg total) by mouth every 6 (six) hours as needed for severe pain. 12 tablet 0   No current facility-administered medications for this visit.     Social History   Socioeconomic History  . Marital status: Married    Spouse name: Not on file  . Number of children: Not on file  . Years of education: Not on file  . Highest education level: Not on file  Occupational History  . Not on file  Social Needs  . Financial resource strain: Not on file  . Food insecurity:    Worry: Not on file    Inability: Not on file  . Transportation needs:    Medical: Not on file    Non-medical: Not on file  Tobacco Use  . Smoking status: Never Smoker  . Smokeless tobacco: Never Used  Substance and Sexual Activity  . Alcohol use: Yes    Comment: occasional social  . Drug use: Never  . Sexual activity: Not on file  Lifestyle  . Physical activity:    Days per week: Not on file    Minutes per session: Not on file  . Stress: Not on file  Relationships  . Social connections:    Talks on phone: Not on file    Gets together: Not on file    Attends religious service: Not on file    Active member of club or organization: Not on file     Attends meetings of clubs or organizations: Not on file    Relationship status: Not on file  . Intimate partner violence:    Fear of current or ex partner: Not on file    Emotionally abused: Not on file    Physically abused: Not on file    Forced sexual activity: Not on file  Other Topics Concern  . Not on file  Social History Narrative  . Not on file    Family History  Problem Relation Age of Onset  . Aneurysm Mother   . Emphysema Father   . Cancer Paternal Uncle     30 minutes of direct face to face counseling time was spent with the patient. This included discussion about prognosis, therapy recommendations and postoperative side effects and are beyond the scope of routine postoperative care.  Thereasa Solo, MD 11/25/2017, 12:36 PM

## 2017-11-25 NOTE — Patient Instructions (Signed)
You are healing well from your surgery. Please follow-up with Dr Sondra Come as scheduled. Continue to avoid placing anything in the vagina until 8 weeks after surgery. You are cleared for all other activity after 1 week.

## 2017-12-15 ENCOUNTER — Telehealth: Payer: Self-pay | Admitting: *Deleted

## 2017-12-15 NOTE — Telephone Encounter (Signed)
CALLED PATIENT TO REMIND OF NEW HDR [place] FOR 12-16-17, LVM FOR A RETURN CALL

## 2017-12-16 ENCOUNTER — Ambulatory Visit
Admission: RE | Admit: 2017-12-16 | Discharge: 2017-12-16 | Disposition: A | Discharge status: Home / Self Care | Payer: PPO | Source: Ambulatory Visit | Attending: Radiation Oncology | Admitting: Radiation Oncology

## 2017-12-16 ENCOUNTER — Encounter: Payer: Self-pay | Admitting: Radiation Oncology

## 2017-12-16 ENCOUNTER — Other Ambulatory Visit: Payer: Self-pay

## 2017-12-16 VITALS — BP 151/69 | HR 55 | Temp 98.2°F | Resp 16 | Ht 62.75 in | Wt 250.5 lb

## 2017-12-16 DIAGNOSIS — Z79899 Other long term (current) drug therapy: Secondary | ICD-10-CM | POA: Insufficient documentation

## 2017-12-16 DIAGNOSIS — C569 Malignant neoplasm of unspecified ovary: Secondary | ICD-10-CM | POA: Insufficient documentation

## 2017-12-16 DIAGNOSIS — Z7901 Long term (current) use of anticoagulants: Secondary | ICD-10-CM | POA: Insufficient documentation

## 2017-12-16 DIAGNOSIS — C541 Malignant neoplasm of endometrium: Secondary | ICD-10-CM

## 2017-12-16 DIAGNOSIS — Z4689 Encounter for fitting and adjustment of other specified devices: Secondary | ICD-10-CM | POA: Diagnosis not present

## 2017-12-16 NOTE — Progress Notes (Signed)
  Radiation Oncology         [phone] ________________________________  Name: Helen West MRN: [id number]  Date: 12/16/2017  DOB: [birth year]  SIMULATION AND TREATMENT PLANNING NOTE HDR BRACHYTHERAPY  DIAGNOSIS:  Stage IB (pT1b, pN0) Invasive Endometrioid Adenocarcinoma, Grade 1  NARRATIVE:  The patient was [facility] to the [facility].  Identity was confirmed.  All relevant records and images related to the planned course of therapy were reviewed.  The patient freely provided informed written consent to proceed with treatment after reviewing the details related to the planned course of therapy. The consent form was witnessed and verified by the simulation staff.  Then, the patient was set-up in a stable reproducible  prone position for radiation therapy.  CT images were obtained.  Surface markings were placed.  The CT images were loaded into the planning software.  Then the target and avoidance structures were contoured.  Treatment planning then occurred.  The radiation prescription was entered and confirmed.   I have requested : Brachytherapy Isodose Plan and Dosimetry Calculations to plan the radiation distribution.    PLAN:  The patient will receive 30 Gy in 5 fractions. The patient will be treated with a 3 cm diameter cylinder with a treatment length of 3 cm. Prescription will be to the mucosal surface. iridium 192 will be the high-dose-rate source.    ________________________________  Blair Promise, PhD, MD   This document serves as a record of services personally performed by Gery Pray, MD. It was created on his behalf by [facility], a trained medical scribe. The creation of this record is based on the scribe's personal observations and the provider's statements to them. This document has been checked and approved by the attending provider.

## 2017-12-16 NOTE — Progress Notes (Signed)
Pt presents today for new [place] HDR. Pt is accompanied by husband. Pt denies dysuria/hematuria. Pt denies vaginal bleeding/discharge. Pt denies rectal bleeding, diarrhea/constipation. Pt denies abdominal bloating, N/V.   BP (!) 151/69 (BP Location: Left Arm, Patient Position: Sitting)   Pulse (!) 55   Temp 98.2 F (36.8 C) (Oral)   Resp 16   Ht 5' 2.75" (1.594 m)   Wt 250 lb 8 oz (113.6 kg)   SpO2 99%   BMI 44.73 kg/m   Wt Readings from Last 3 Encounters:  12/16/17 250 lb 8 oz (113.6 kg)  11/25/17 249 lb (112.9 kg)  11/15/17 249 lb 6 oz (113.1 kg)   Loma Sousa, RN BSN

## 2017-12-16 NOTE — Progress Notes (Signed)
  Radiation Oncology         [phone] ________________________________  Name: Helen West MRN: [id number]  Date: 12/16/2017  DOB: 02/13/1955  CC: [name], MD  [name], MD  HDR BRACHYTHERAPY NOTE  DIAGNOSIS: Stage IB (pT1b, pN0) Invasive Endometrioid Adenocarcinoma, Grade 1   Simple treatment device note: Patient had construction of her custom vaginal cylinder. She will be treated with a 3 cm diameter segmented cylinder. This conforms to her anatomy without undue discomfort.  Vaginal brachytherapy procedure node: The patient was brought to the [place] suite. Identity was confirmed. All relevant records and images related to the planned course of therapy were reviewed. The patient freely provided informed written consent to proceed with treatment after reviewing the details related to the planned course of therapy. The consent form was witnessed and verified by the simulation staff. Then, the patient was set-up in a stable reproducible supine position for radiation therapy. Pelvic exam revealed the vaginal cuff to be intact. The patient's custom vaginal cylinder was placed in the proximal vagina. This was affixed to the CT/MR stabilization plate to prevent slippage. Patient tolerated the placement well.  Verification simulation note:  A fiducial marker was placed within the vaginal cylinder. An AP and lateral film was then obtained through the pelvis area. This documented accurate position of the vaginal cylinder for treatment.  HDR BRACHYTHERAPY TREATMENT  The remote afterloading device was affixed to the vaginal cylinder by catheter. Patient then proceeded to undergo her first high-dose-rate treatment directed at the proximal vagina. The patient was prescribed a dose of 6 gray to be delivered to the mucosal surface. Treatment length was 3 cm. Patient was treated with 1 channel using 7 dwell positions. Treatment time was 219.3 seconds. Iridium 192 was the high-dose-rate source for  treatment. The patient tolerated the treatment well. After completion of her therapy, a radiation survey was performed documenting return of the iridium source into the [employer] safe.   PLAN: Follow up in 1 week for second treatment.  ________________________________  Blair Promise, PhD, MD   This document serves as a record of services personally performed by Gery Pray, MD. It was created on his behalf by [facility], a trained medical scribe. The creation of this record is based on the scribe's personal observations and the provider's statements to them. This document has been checked and approved by the attending provider.

## 2017-12-16 NOTE — Progress Notes (Signed)
Radiation Oncology         [phone] ________________________________  Name: SHAKISHA ABEND MRN: [id number]  Date: 12/16/2017  DOB: [birth year]  Vaginal Brachytherapy Procedure Note  CC: [name], MD [name], MD    ICD-10-CM   1. Endometrial cancer (HCC) C54.1     Diagnosis: Stage IB (pT1b, pN0) Invasive Endometrioid Adenocarcinoma, Grade 1  Narrative: She returns today for vaginal cylinder fitting. She recently met with Dr. Denman George on 11/25/2017, vaginal cuff healing well.   ALLERGIES: has No Known Allergies.  Meds: Current Outpatient Medications  Medication Sig Dispense Refill  . acetaminophen (TYLENOL) 650 MG CR tablet Take 1,950 mg by mouth daily. Patient takes 3 tablets daily in the morning to equal a '1950mg'$  a day.(Per Patient)     . alendronate (FOSAMAX) 70 MG tablet Take 70 mg by mouth once a week. Take with a full glass of water on an empty stomach.    Marland Kitchen atorvastatin (LIPITOR) 10 MG tablet Take 10 mg by mouth daily.    . flecainide (TAMBOCOR) 100 MG tablet Take 100 mg by mouth 2 (two) times daily.     . metoprolol tartrate (LOPRESSOR) 25 MG tablet Take 12.5 mg by mouth 2 (two) times daily.     Marland Kitchen OVER THE COUNTER MEDICATION Take 1 tablet by mouth daily. Mega Red '750mg'$  tablet once daily.    . rivaroxaban (XARELTO) 20 MG TABS tablet Take 1 tablet (20 mg total) by mouth daily with supper.    . senna-docusate (SENOKOT-S) 8.6-50 MG tablet Take 2 tablets by mouth at bedtime. Do not take if having loose stools 60 tablet 1  . traMADol (ULTRAM) 50 MG tablet Take 1 tablet (50 mg total) by mouth every 6 (six) hours as needed for severe pain. 12 tablet 0   No current facility-administered medications for this encounter.     Physical Findings: The patient is in no acute distress. Patient is alert and oriented.  height is 5' 2.75" (1.594 m) and weight is 250 lb 8 oz (113.6 kg). Her oral temperature is 98.2 F (36.8 C). Her blood pressure is 151/69 (abnormal) and her pulse is  55 (abnormal). Her respiration is 16 and oxygen saturation is 99%.   No palpable cervical, supraclavicular or axillary lymphoadenopathy. The heart has a regular rate and rhythm. The lungs are clear to auscultation. Abdomen soft and non-tender.  On pelvic examination the external genitalia were unremarkable. A speculum exam was performed. Vaginal cuff intact, no mucosal lesions. On bimanual exam there were no pelvic masses appreciated.  Lab Findings: Lab Results  Component Value Date   WBC 8.6 11/03/2017   HGB 12.5 11/03/2017   HCT 39.0 11/03/2017   MCV 96.5 11/03/2017   PLT 285 11/03/2017    Radiographic Findings: No results found.  Impression:   Patient was fitted for a vaginal cylinder. The patient will be treated with a 3 cm diameter cylinder with a treatment length of 3 cm. This distended the vaginal vault without undue discomfort. The patient tolerated the procedure well.  The patient was successfully fitted for a vaginal cylinder. The patient is appropriate to begin vaginal brachytherapy.   Plan: The patient will proceed with CT simulation and vaginal brachytherapy today.    _______________________________   Blair Promise, PhD, MD  This document serves as a record of services personally performed by Gery Pray, MD. It was created on his behalf by [facility], a trained medical scribe. The creation of this record is based  on the scribe's personal observations and the provider's statements to them. This document has been checked and approved by the attending provider.

## 2017-12-22 ENCOUNTER — Telehealth: Payer: Self-pay | Admitting: *Deleted

## 2017-12-22 NOTE — Telephone Encounter (Signed)
CALLED PATIENT TO REMIND OF HDR [place]. FOR 12-23-17 @ 2 PM, SPOKE WITH PATIENT AND SHE IS AWARE OF THIS [place].

## 2017-12-23 ENCOUNTER — Ambulatory Visit
Admission: RE | Admit: 2017-12-23 | Discharge: 2017-12-23 | Disposition: A | Discharge status: Home / Self Care | Payer: PPO | Source: Ambulatory Visit | Attending: Radiation Oncology | Admitting: Radiation Oncology

## 2017-12-23 DIAGNOSIS — C541 Malignant neoplasm of endometrium: Secondary | ICD-10-CM | POA: Diagnosis not present

## 2017-12-23 DIAGNOSIS — C569 Malignant neoplasm of unspecified ovary: Secondary | ICD-10-CM | POA: Diagnosis not present

## 2017-12-23 NOTE — Progress Notes (Signed)
  Radiation Oncology         (336) [phone] ________________________________  Name: Helen West MRN: [id number]  Date: 12/23/2017  DOB: 1955/05/27  CC: [name], MD  [name], MD  HDR BRACHYTHERAPY NOTE  DIAGNOSIS: 62 y.o. female with Stage IB (pT1b, pN0) Invasive Endometrioid Adenocarcinoma, Grade 1   Simple treatment device note: Patient had construction of her custom vaginal cylinder. She will be treated with a 3 cm diameter segmented cylinder. This conforms to her anatomy without undue discomfort.  Vaginal brachytherapy procedure node: The patient was brought to the [place] suite. Identity was confirmed. All relevant records and images related to the planned course of therapy were reviewed. The patient freely provided informed written consent to proceed with treatment after reviewing the details related to the planned course of therapy. The consent form was witnessed and verified by the simulation staff. Then, the patient was set-up in a stable reproducible supine position for radiation therapy. Pelvic exam revealed the vaginal cuff to be intact. The patient's custom vaginal cylinder was placed in the proximal vagina. This was affixed to the CT/MR stabilization plate to prevent slippage. Patient tolerated the placement well.  Verification simulation note:  A fiducial marker was placed within the vaginal cylinder. An AP and lateral film was then obtained through the pelvis area. This documented accurate position of the vaginal cylinder for treatment.  HDR BRACHYTHERAPY TREATMENT  The remote afterloading device was affixed to the vaginal cylinder by catheter. Patient then proceeded to undergo her second high-dose-rate treatment directed at the proximal vagina. The patient was prescribed a dose of 6.0 gray to be delivered to the mucosal surface. Treatment length was 3 cm. Patient was treated with 1 channels using 7 dwell positions. Treatment time was 234.20 seconds. Iridium 192 was the  high-dose-rate source for treatment. The patient tolerated the treatment well. After completion of her therapy, a radiation survey was performed documenting return of the iridium source into the Gamma[employer] safe.   PLAN: The patient will return on 12/29/17 for her third HDR treatment. ________________________________   Blair Promise, PhD, MD  This document serves as a record of services personally performed by Gery Pray, MD. It was created on his behalf by Rae Lips, a trained medical scribe. The creation of this record is based on the scribe's personal observations and the provider's statements to them. This document has been checked and approved by the attending provider.

## 2017-12-28 ENCOUNTER — Telehealth: Payer: Self-pay | Admitting: *Deleted

## 2017-12-28 NOTE — Telephone Encounter (Signed)
Called patient to remind of HDR Tx. for 12-29-17 @ 2 pm @ [place], lvm for a return call

## 2017-12-29 ENCOUNTER — Ambulatory Visit
Admission: RE | Admit: 2017-12-29 | Discharge: 2017-12-29 | Disposition: A | Discharge status: Home / Self Care | Payer: PPO | Source: Ambulatory Visit | Attending: Radiation Oncology | Admitting: Radiation Oncology

## 2017-12-29 DIAGNOSIS — I483 Typical atrial flutter: Secondary | ICD-10-CM | POA: Diagnosis not present

## 2017-12-29 DIAGNOSIS — C541 Malignant neoplasm of endometrium: Secondary | ICD-10-CM | POA: Diagnosis not present

## 2017-12-29 DIAGNOSIS — Z7901 Long term (current) use of anticoagulants: Secondary | ICD-10-CM | POA: Diagnosis not present

## 2017-12-29 DIAGNOSIS — C548 Malignant neoplasm of overlapping sites of corpus uteri: Secondary | ICD-10-CM

## 2017-12-29 DIAGNOSIS — I480 Paroxysmal atrial fibrillation: Secondary | ICD-10-CM | POA: Diagnosis not present

## 2017-12-29 DIAGNOSIS — C569 Malignant neoplasm of unspecified ovary: Secondary | ICD-10-CM | POA: Diagnosis not present

## 2017-12-29 HISTORY — DX: Malignant neoplasm of overlapping sites of corpus uteri: C54.8

## 2017-12-29 NOTE — Progress Notes (Signed)
  Radiation Oncology         (336) [phone] ________________________________  Name: CADEE AGRO MRN: [id number]  Date: 12/29/2017  DOB: [birth year]  CC: [name], MD  [name], MD  HDR BRACHYTHERAPY NOTE  DIAGNOSIS: 62 y.o. female with Stage IB (pT1b, pN0) Invasive Endometrioid Adenocarcinoma, Grade 1   Simple treatment device note: Patient had construction of her custom vaginal cylinder. She will be treated with a 3 cm diameter segmented cylinder. This conforms to her anatomy without undue discomfort.  Vaginal brachytherapy procedure node: The patient was brought to the [place] suite. Identity was confirmed. All relevant records and images related to the planned course of therapy were reviewed. The patient freely provided informed written consent to proceed with treatment after reviewing the details related to the planned course of therapy. The consent form was witnessed and verified by the simulation staff. Then, the patient was set-up in a stable reproducible supine position for radiation therapy. Pelvic exam revealed the vaginal cuff to be intact. The patient's custom vaginal cylinder was placed in the proximal vagina. This was affixed to the CT/MR stabilization plate to prevent slippage. Patient tolerated the placement well.  Verification simulation note:  A fiducial marker was placed within the vaginal cylinder. An AP and lateral film was then obtained through the pelvis area. This documented accurate position of the vaginal cylinder for treatment.  HDR BRACHYTHERAPY TREATMENT  The remote afterloading device was affixed to the vaginal cylinder by catheter. Patient then proceeded to undergo her third high-dose-rate treatment directed at the proximal vagina. The patient was prescribed a dose of 6.0 gray to be delivered to the mucosal surface. Treatment length was 3 cm. Patient was treated with 1 channels using 7 dwell positions. Treatment time was 247.80 seconds. Iridium 192 was the  high-dose-rate source for treatment. The patient tolerated the treatment well. After completion of her therapy, a radiation survey was performed documenting return of the iridium source into the [employer] safe.   PLAN: The patient will return on 01/05/2018 for her fourth HDR treatment. ________________________________   Blair Promise, PhD, MD  This document serves as a record of services personally performed by Gery Pray, MD. It was created on his behalf by Arlyce Harman, a trained medical scribe. The creation of this record is based on the scribe's personal observations and the provider's statements to them. This document has been checked and approved by the attending provider.

## 2018-01-04 ENCOUNTER — Telehealth: Payer: Self-pay | Admitting: *Deleted

## 2018-01-04 NOTE — Telephone Encounter (Signed)
CALLED PATIENT TO REMIND OF HDR TX. FOR 01-05-18 @ 10 AM, LVM FOR A RETURN CALL

## 2018-01-05 ENCOUNTER — Ambulatory Visit
Admission: RE | Admit: 2018-01-05 | Discharge: 2018-01-05 | Disposition: A | Discharge status: Home / Self Care | Payer: PPO | Source: Ambulatory Visit | Attending: Radiation Oncology | Admitting: Radiation Oncology

## 2018-01-05 DIAGNOSIS — C569 Malignant neoplasm of unspecified ovary: Secondary | ICD-10-CM | POA: Diagnosis not present

## 2018-01-05 DIAGNOSIS — C541 Malignant neoplasm of endometrium: Secondary | ICD-10-CM | POA: Diagnosis not present

## 2018-01-05 NOTE — Progress Notes (Signed)
  Radiation Oncology         (336) [phone] ________________________________  Name: Helen West MRN: [id number]  Date: 01/05/2018  DOB: Nov 05, 1955  CC: [name], MD  [name], MD  HDR BRACHYTHERAPY NOTE  DIAGNOSIS: 62 y.o. female with Stage IB (pT1b, pN0) Invasive Endometrioid Adenocarcinoma, Grade 1   Simple treatment device note: Patient had construction of her custom vaginal cylinder. She will be treated with a 3 cm diameter segmented cylinder. This conforms to her anatomy without undue discomfort.  Vaginal brachytherapy procedure node: The patient was brought to the [place] suite. Identity was confirmed. All relevant records and images related to the planned course of therapy were reviewed. The patient freely provided informed written consent to proceed with treatment after reviewing the details related to the planned course of therapy. The consent form was witnessed and verified by the simulation staff. Then, the patient was set-up in a stable reproducible supine position for radiation therapy. Pelvic exam revealed the vaginal cuff to be intact. The patient's custom vaginal cylinder was placed in the proximal vagina. This was affixed to the CT/MR stabilization plate to prevent slippage. Patient tolerated the placement well.  Verification simulation note:  A fiducial marker was placed within the vaginal cylinder. An AP and lateral film was then obtained through the pelvis area. This documented accurate position of the vaginal cylinder for treatment.  HDR BRACHYTHERAPY TREATMENT  The remote afterloading device was affixed to the vaginal cylinder by catheter. Patient then proceeded to undergo her fourth high-dose-rate treatment directed at the proximal vagina. The patient was prescribed a dose of 6.0 gray to be delivered to the mucosal surface. Treatment length was 3 cm. Patient was treated with 1 channel using 7 dwell positions. Treatment time was 254.50 seconds. Iridium 192 was the  high-dose-rate source for treatment. The patient tolerated the treatment well. After completion of her therapy, a radiation survey was performed documenting return of the iridium source into the [employer] safe.   PLAN: The patient will return on 01/12/2018 for her fifth HDR treatment. ________________________________   Blair Promise, PhD, MD  This document serves as a record of services personally performed by Gery Pray, MD. It was created on his behalf by Mary-Margaret Loma Messing, a trained medical scribe. The creation of this record is based on the scribe's personal observations and the provider's statements to them. This document has been checked and approved by the attending provider.

## 2018-01-06 ENCOUNTER — Ambulatory Visit: Payer: PPO | Admitting: Radiation Oncology

## 2018-01-11 ENCOUNTER — Telehealth: Payer: Self-pay | Admitting: *Deleted

## 2018-01-11 NOTE — Telephone Encounter (Signed)
Called patient to remind of HDR Tx. for 01-12-18 @ 10 am, spoke with patient and she is aware of this tx.

## 2018-01-12 ENCOUNTER — Ambulatory Visit
Admission: RE | Admit: 2018-01-12 | Discharge: 2018-01-12 | Disposition: A | Discharge status: Home / Self Care | Payer: PPO | Source: Ambulatory Visit | Attending: Radiation Oncology | Admitting: Radiation Oncology

## 2018-01-12 ENCOUNTER — Encounter: Payer: Self-pay | Admitting: Radiation Oncology

## 2018-01-12 DIAGNOSIS — C541 Malignant neoplasm of endometrium: Secondary | ICD-10-CM | POA: Diagnosis not present

## 2018-01-12 DIAGNOSIS — C569 Malignant neoplasm of unspecified ovary: Secondary | ICD-10-CM | POA: Diagnosis not present

## 2018-01-12 DIAGNOSIS — Z923 Personal history of irradiation: Secondary | ICD-10-CM

## 2018-01-12 HISTORY — DX: Personal history of irradiation: Z92.3

## 2018-01-12 NOTE — Progress Notes (Signed)
  Radiation Oncology         [phone] ________________________________  Name: Helen West MRN: [id number]  Date: 01/12/2018  DOB: Aug 30, 1955  End of Treatment Note  Diagnosis:   Stage IB (pT1b, pN0) Invasive Endometrioid Adenocarcinoma, Grade 1     Indication for treatment:  Curative       Radiation treatment dates:   12/16/17, 12/23/17, 12/29/17, 01/05/18, 01/12/18  Site/dose:  Vagina, 6 Gy in 5 fractions for a total dose of 30 Gy  Beams/energy:   HDR Ir-Vaginal, Iridium HDR, patient was treated with a 3 cm diameter segmented cylinder, Rx length of 3 cm, prescription was 6 gray to the mucosal surface  Narrative: The patient tolerated radiation treatment relatively well.     Pt denied dysuria/hematuria, vaginal bleeding/discharge, rectal bleeding, diarrhea/constipation, and abdominal bloating throughout treatments. Overall the pt was without complaints.   Plan: The patient has completed radiation treatment. The patient will return to radiation oncology clinic for routine followup in one month. I advised them to call or return sooner if they have any questions or concerns related to their recovery or treatment.  -----------------------------------  Blair Promise, PhD, MD  This document serves as a record of services personally performed by Gery Pray, MD. It was created on his behalf by Mary-Margaret Loma Messing, a trained medical scribe. The creation of this record is based on the scribe's personal observations and the provider's statements to them. This document has been checked and approved by the attending provider.

## 2018-01-12 NOTE — Progress Notes (Signed)
  Radiation Oncology         (336) [phone] ________________________________  Name: Helen West MRN: [id number]  Date: 01/12/2018  DOB: [birth year]  CC: [name], MD  [name], MD  HDR BRACHYTHERAPY NOTE  DIAGNOSIS: 62 y.o. female with Stage IB (pT1b, pN0) Invasive Endometrioid Adenocarcinoma, Grade 1   Simple treatment device note: Patient had construction of her custom vaginal cylinder. She will be treated with a 3 cm diameter segmented cylinder. This conforms to her anatomy without undue discomfort.  Vaginal brachytherapy procedure node: The patient was brought to the [place] suite. Identity was confirmed. All relevant records and images related to the planned course of therapy were reviewed. The patient freely provided informed written consent to proceed with treatment after reviewing the details related to the planned course of therapy. The consent form was witnessed and verified by the simulation staff. Then, the patient was set-up in a stable reproducible supine position for radiation therapy. Pelvic exam revealed the vaginal cuff to be intact. The patient's custom vaginal cylinder was placed in the proximal vagina. This was affixed to the CT/MR stabilization plate to prevent slippage. Patient tolerated the placement well.  Verification simulation note:  A fiducial marker was placed within the vaginal cylinder. An AP and lateral film was then obtained through the pelvis area. This documented accurate position of the vaginal cylinder for treatment.  HDR BRACHYTHERAPY TREATMENT  The remote afterloading device was affixed to the vaginal cylinder by catheter. Patient then proceeded to undergo her fifth high-dose-rate treatment directed at the proximal vagina. The patient was prescribed a dose of 6.0 gray to be delivered to the mucosal surface. Treatment length was 3 cm. Patient was treated with 1 channel using 7 dwell positions. Treatment time was 282.60 seconds. Iridium 192 was the  high-dose-rate source for treatment. The patient tolerated the treatment well. After completion of her therapy, a radiation survey was performed documenting return of the iridium source into the [employer] safe.   PLAN: The patient has completed her treatments. She will return to radiation oncology in 1 month for follow-up. ________________________________   Blair Promise, PhD, MD  This document serves as a record of services personally performed by Gery Pray, MD. It was created on his behalf by Mary-Margaret Loma Messing, a trained medical scribe. The creation of this record is based on the scribe's personal observations and the provider's statements to them. This document has been checked and approved by the attending provider.

## 2018-01-13 ENCOUNTER — Ambulatory Visit: Payer: PPO | Admitting: Radiation Oncology

## 2018-01-13 ENCOUNTER — Encounter: Payer: Self-pay | Admitting: Internal Medicine

## 2018-01-13 DIAGNOSIS — R002 Palpitations: Secondary | ICD-10-CM | POA: Diagnosis not present

## 2018-01-20 ENCOUNTER — Ambulatory Visit: Payer: PPO | Admitting: Radiation Oncology

## 2018-02-14 ENCOUNTER — Encounter: Payer: Self-pay | Admitting: Radiation Oncology

## 2018-02-14 ENCOUNTER — Telehealth: Payer: Self-pay | Admitting: *Deleted

## 2018-02-14 ENCOUNTER — Ambulatory Visit
Admission: RE | Admit: 2018-02-14 | Discharge: 2018-02-14 | Disposition: A | Discharge status: Home / Self Care | Payer: PPO | Source: Ambulatory Visit | Attending: Radiation Oncology | Admitting: Radiation Oncology

## 2018-02-14 ENCOUNTER — Other Ambulatory Visit: Payer: Self-pay

## 2018-02-14 VITALS — BP 148/75 | HR 55 | Temp 98.3°F | Resp 20 | Wt 257.0 lb

## 2018-02-14 DIAGNOSIS — C548 Malignant neoplasm of overlapping sites of corpus uteri: Secondary | ICD-10-CM | POA: Insufficient documentation

## 2018-02-14 DIAGNOSIS — Z7901 Long term (current) use of anticoagulants: Secondary | ICD-10-CM | POA: Diagnosis not present

## 2018-02-14 DIAGNOSIS — C541 Malignant neoplasm of endometrium: Secondary | ICD-10-CM | POA: Diagnosis not present

## 2018-02-14 DIAGNOSIS — Z08 Encounter for follow-up examination after completed treatment for malignant neoplasm: Secondary | ICD-10-CM | POA: Diagnosis not present

## 2018-02-14 DIAGNOSIS — Z79899 Other long term (current) drug therapy: Secondary | ICD-10-CM | POA: Diagnosis not present

## 2018-02-14 NOTE — Progress Notes (Signed)
Radiation Oncology         (336) [phone] ________________________________  Name: Helen West MRN: [id number]  Date: 02/14/2018  DOB: Sep 14, 1955  Follow-Up Visit Note  CC: Helen Brome, MD  Helen Brome, MD    ICD-10-CM   1. Endometrial cancer ([place]) C54.1   2. 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  she is doing well overall. She was given dilators today and instructed on their use.            On review of systems, she reports bowel pressure since her surgery (6 months ago) and fatigue she attributes to her Tambocor. she denies any other symptoms. Pertinent positives are listed and detailed within the above HPI. She denies any vaginal bleeding or discharge. She did not experience any significant side effects after her treatment.                ALLERGIES:  has No Known Allergies.  Meds: Current Outpatient Medications  Medication Sig Dispense Refill  . acetaminophen (TYLENOL) 650 MG CR tablet Take 1,950 mg by mouth daily. Patient takes 3 tablets daily in the morning to equal a 1950mg  a day.(Per Patient)     . alendronate (FOSAMAX) 70 MG tablet Take 70 mg by mouth once a week. Take with a full glass of water on an empty stomach.    Marland Kitchen atorvastatin (LIPITOR) 10 MG tablet Take 10 mg by mouth daily.    . flecainide (TAMBOCOR) 100 MG tablet Take 100 mg by mouth 2 (two) times daily.     . metoprolol tartrate (LOPRESSOR) 25 MG tablet Take 12.5 mg by mouth 2 (two) times daily.     Marland Kitchen OVER THE COUNTER MEDICATION Take 1 tablet by mouth daily. Mega Red 750mg  tablet once daily.    . rivaroxaban (XARELTO) 20 MG TABS tablet Take  1 tablet (20 mg total) by mouth daily with supper.    . flecainide (TAMBOCOR) 150 MG tablet Take by mouth.    . senna-docusate (SENOKOT-S) 8.6-50 MG tablet Take 2 tablets by mouth at bedtime. Do not take if having loose stools (Patient not taking: Reported on 02/14/2018) 60 tablet 1  . traMADol (ULTRAM) 50 MG tablet Take 1 tablet (50 mg total) by mouth every 6 (six) hours as needed for severe pain. (Patient not taking: Reported on 02/14/2018) 12 tablet 0   No current facility-administered medications for this encounter.     Physical Findings: The patient is in no acute distress. Patient is alert and oriented.  weight is 257 lb (116.6 kg). Her oral temperature is 98.3 F (36.8 C). Her blood pressure is 148/75 (abnormal) and her pulse is 55 (abnormal). Her respiration is 20 and oxygen saturation is 98%. .  No significant changes. Lungs are clear to auscultation bilaterally. Heart has regular rate and rhythm. No palpable cervical, supraclavicular, or axillary adenopathy. Abdomen soft, non-tender, normal bowel sounds. Pelvic exam not performed in light of recent treatment completion.  Lab Findings: Lab Results  Component Value Date   WBC 8.6 11/03/2017   HGB 12.5 11/03/2017   HCT 39.0 11/03/2017   MCV  96.5 11/03/2017   PLT 285 11/03/2017    Radiographic Findings: No results found.  Impression:  Stage IB (pT1b, pN0) Invasive Endometrioid Adenocarcinoma, Grade 1.  Patient is doing well after her brachytherapy with no appreciable side effects. Patient was given a vaginal dilator and instructions on its use.   Plan:  Follow-up in radiation oncology in 5 months. Follow-up with Dr. Denman George in 2 months.  ____________________________________   Blair Promise, PhD, MD    This document serves as a record of services personally performed by Gery Pray, MD. It was created on his behalf by Mary-Margaret Loma Messing, a trained medical scribe. The creation of this record is based on the scribe's personal  observations and the provider's statements to them. This document has been checked and approved by the attending provider.

## 2018-02-14 NOTE — Telephone Encounter (Signed)
Returned Shirley's call from radiation, she had asked for a 10 month follow up appt. Per Dr. Denman George, patient to be seen in March. Appt scheduled for March 13th. Called and left Enid Derry a message with the appt. Also called and left the patient a message with the appt date/time.

## 2018-02-14 NOTE — Progress Notes (Signed)
Helen West is here today for a follow-up appointment with Dr. Sondra Come. Helen West received her dilator and instructions. She received S+ and S. Patient denies any pain today ,but states that she has pain with urination and stools..Patient states that she is feeling fatigued ,but it is not due to radiation. Patient denies any vaginal or rectal bleeding. Patient denies any vaginal discharge.Patient denies any dysuria. Patient reports nocturia x 1-2. Patient denies any nausea or vomiting.Patient denies any issues with her skin at her radiation site. Vitals:   02/14/18 1004  BP: (!) 148/75  Pulse: (!) 55  Resp: 20  Temp: 98.3 F (36.8 C)  TempSrc: Oral  SpO2: 98%  Weight: 257 lb (116.6 kg)   Wt Readings from Last 3 Encounters:  02/14/18 257 lb (116.6 kg)  12/16/17 250 lb 8 oz (113.6 kg)  11/25/17 249 lb (112.9 kg)

## 2018-02-18 DIAGNOSIS — I480 Paroxysmal atrial fibrillation: Secondary | ICD-10-CM | POA: Diagnosis not present

## 2018-02-18 DIAGNOSIS — I10 Essential (primary) hypertension: Secondary | ICD-10-CM | POA: Diagnosis not present

## 2018-02-18 DIAGNOSIS — Z7901 Long term (current) use of anticoagulants: Secondary | ICD-10-CM | POA: Diagnosis not present

## 2018-03-07 DIAGNOSIS — Z6841 Body Mass Index (BMI) 40.0 and over, adult: Secondary | ICD-10-CM | POA: Diagnosis not present

## 2018-03-07 DIAGNOSIS — C541 Malignant neoplasm of endometrium: Secondary | ICD-10-CM | POA: Diagnosis not present

## 2018-03-07 DIAGNOSIS — I480 Paroxysmal atrial fibrillation: Secondary | ICD-10-CM | POA: Diagnosis not present

## 2018-03-07 DIAGNOSIS — E782 Mixed hyperlipidemia: Secondary | ICD-10-CM | POA: Diagnosis not present

## 2018-03-11 ENCOUNTER — Encounter: Payer: Self-pay | Admitting: *Deleted

## 2018-04-14 DIAGNOSIS — J018 Other acute sinusitis: Secondary | ICD-10-CM | POA: Diagnosis not present

## 2018-04-14 NOTE — Progress Notes (Deleted)
Follow-up Note: Gyn-Onc   Helen West 63 y.o. female  No chief complaint on file.   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 S/p adjuvant brachytherapy for high/intermediate risk factors completed on ***.  No evidence for recurrence.   Plan: I recommend she follow-up at 3 monthly intervals for symptom review, physical examination and pelvic examination. Pap smear is not recommended in routine endometrial cancer surveillance. After 2 years we will space these visits to every 6 months, and then annually if recurrence has not developed within 5 years. I will see her in 6 months and Dr Sondra Come will see her back in 3 months.   HPI: 63 year old white married female gravida 3 seen in consultation request of Dr. Paula Compton regarding management of complex atypical hyperplasia with papillary features.  The patient presented with postmenopausal bleeding and underwent an ultrasound showing an endometrial stripe of 21 mm.  An endometrial biopsy was obtained on October 13, 2017.  Obstetrical history gravida 3.  Patient had a postpartum tubal ligation following her second child.  She has had no other abdominal or pelvic surgery.  She has had a right total hip replacement.  She has atrial fibrillation and is on Xarelto and metoprolol.  On 11/02/17 she underwent robotic assisted total hysterectomy, BSO, SLN biopsy. Final pathology revealed a 5.5cm grade 1 endometrioid adenocarcinoma with 16 of 57mm myometrial invasion and LVSI present. Nodes,adnexa and cervix were negative.  Due to high/intermediate risk factors she was recommended to have adjuvant vaginal brachytherapy for local control.    Interval Hx: She went on to completed 30Gy of vaginal brachytherapy (6Gy x 5) between ***.   Review of Systems:10 point review of systems is negative except as noted in interval history.   Vitals: There were no vitals taken for this visit.  Physical  Exam: General : The patient is a healthy woman in no acute distress.  HEENT: normocephalic, extraoccular movements normal; neck is supple without thyromegally  Lynphnodes: Supraclavicular and inguinal nodes not enlarged  Abdomen: Obese soft, non-tender, no ascites, no organomegally, no masses, no hernias  Pelvic:  Vaginal cuff normal ***, no bleeding, no masses Rectal: deferred Lower extremities: No edema or varicosities. Normal range of motion      No Known Allergies  Past Medical History:  Diagnosis Date  . A-fib ([place])   . Anemia   . Arthritis   . Cancer ([place])   . Complication of anesthesia    Loss of vocal/voice x 2 months  . Dysrhythmia   . Headache   . High cholesterol   . Obesity   . Post-menopausal bleeding     Past Surgical History:  Procedure Laterality Date  . ROBOTIC ASSISTED TOTAL HYSTERECTOMY WITH BILATERAL SALPINGO OOPHERECTOMY Bilateral 11/02/2017   Procedure: XI ROBOTIC ASSISTED TOTAL HYSTERECTOMY WITH BILATERAL SALPINGO OOPHORECTOMY;  Surgeon: [name], MD;  Location: [facility];  Service: Gynecology;  Laterality: Bilateral;  . SENTINEL NODE BIOPSY N/A 11/02/2017   Procedure: SENTINEL NODE BIOPSY;  Surgeon: [name], MD;  Location: [facility];  Service: Gynecology;  Laterality: N/A;  . TONSILLECTOMY    . TOTAL HIP ARTHROPLASTY    . TUBAL LIGATION      Current Outpatient Medications  Medication Sig Dispense Refill  . acetaminophen (TYLENOL) 650 MG CR tablet Take 1,950 mg by mouth daily. Patient takes 3 tablets daily in the morning to equal a 1950mg  a day.(Per Patient)     . alendronate (FOSAMAX) 70 MG  tablet Take 70 mg by mouth once a week. Take with a full glass of water on an empty stomach.    Marland Kitchen atorvastatin (LIPITOR) 10 MG tablet Take 10 mg by mouth daily.    . flecainide (TAMBOCOR) 100 MG tablet Take 100 mg by mouth 2 (two) times daily.     . flecainide (TAMBOCOR) 150 MG tablet Take by mouth.    . metoprolol tartrate (LOPRESSOR) 25 MG tablet Take 12.5  mg by mouth 2 (two) times daily.     Marland Kitchen OVER THE COUNTER MEDICATION Take 1 tablet by mouth daily. Mega Red 750mg  tablet once daily.    . rivaroxaban (XARELTO) 20 MG TABS tablet Take 1 tablet (20 mg total) by mouth daily with supper.    . senna-docusate (SENOKOT-S) 8.6-50 MG tablet Take 2 tablets by mouth at bedtime. Do not take if having loose stools (Patient not taking: Reported on 02/14/2018) 60 tablet 1  . traMADol (ULTRAM) 50 MG tablet Take 1 tablet (50 mg total) by mouth every 6 (six) hours as needed for severe pain. (Patient not taking: Reported on 02/14/2018) 12 tablet 0   No current facility-administered medications for this visit.     Social History   Socioeconomic History  . Marital status: Married    Spouse name: Not on file  . Number of children: Not on file  . Years of education: Not on file  . Highest education level: Not on file  Occupational History  . Not on file  Social Needs  . Financial resource strain: Not on file  . Food insecurity:    Worry: Not on file    Inability: Not on file  . Transportation needs:    Medical: Not on file    Non-medical: Not on file  Tobacco Use  . Smoking status: Never Smoker  . Smokeless tobacco: Never Used  Substance and Sexual Activity  . Alcohol use: Yes    Comment: occasional social  . Drug use: Never  . Sexual activity: Not on file  Lifestyle  . Physical activity:    Days per week: Not on file    Minutes per session: Not on file  . Stress: Not on file  Relationships  . Social connections:    Talks on phone: Not on file    Gets together: Not on file    Attends religious service: Not on file    Active member of club or organization: Not on file    Attends meetings of clubs or organizations: Not on file    Relationship status: Not on file  . Intimate partner violence:    Fear of current or ex partner: Not on file    Emotionally abused: Not on file    Physically abused: Not on file    Forced sexual activity: Not on file   Other Topics Concern  . Not on file  Social History Narrative  . Not on file    Family History  Problem Relation Age of Onset  . Aneurysm Mother   . Emphysema Father   . Cancer Paternal Uncle     Thereasa Solo, MD 04/14/2018, 5:00 PM

## 2018-04-15 ENCOUNTER — Telehealth: Payer: Self-pay | Admitting: *Deleted

## 2018-04-15 ENCOUNTER — Inpatient Hospital Stay: Payer: PPO | Admitting: Gynecologic Oncology

## 2018-04-15 NOTE — Telephone Encounter (Signed)
Patient called and rescheduled her appt from today to next Friday. Patient stated that she is sick with a sinus infection

## 2018-04-18 DIAGNOSIS — J208 Acute bronchitis due to other specified organisms: Secondary | ICD-10-CM | POA: Diagnosis not present

## 2018-04-19 ENCOUNTER — Telehealth: Payer: Self-pay | Admitting: *Deleted

## 2018-04-19 NOTE — Telephone Encounter (Signed)
Called and spoke with the patient regarding her appt for Friday. Moved her apapt from Friday to April 22nd at 2:30pm. Appt moved to limit the patient's exposure to the coronavirus

## 2018-04-22 ENCOUNTER — Ambulatory Visit: Payer: PPO | Admitting: Gynecologic Oncology

## 2018-04-25 DIAGNOSIS — R42 Dizziness and giddiness: Secondary | ICD-10-CM | POA: Diagnosis not present

## 2018-05-20 ENCOUNTER — Telehealth: Payer: Self-pay | Admitting: *Deleted

## 2018-05-20 NOTE — Telephone Encounter (Signed)
Called and spoke with the patient regarding her appt for 4/27. Explained that we needed to move the appt from 4/27 to 4/29 and change to WebEx. Patient not comfortable with the virtual visit, appt moved to June

## 2018-05-30 ENCOUNTER — Ambulatory Visit: Payer: PPO | Admitting: Gynecologic Oncology

## 2018-06-28 DIAGNOSIS — M25512 Pain in left shoulder: Secondary | ICD-10-CM | POA: Diagnosis not present

## 2018-07-13 ENCOUNTER — Telehealth: Payer: Self-pay | Admitting: *Deleted

## 2018-07-13 ENCOUNTER — Other Ambulatory Visit: Payer: Self-pay

## 2018-07-13 ENCOUNTER — Encounter: Payer: Self-pay | Admitting: Gynecologic Oncology

## 2018-07-13 ENCOUNTER — Inpatient Hospital Stay: Payer: PPO | Attending: Gynecologic Oncology | Admitting: Gynecologic Oncology

## 2018-07-13 VITALS — BP 149/72 | HR 51 | Temp 98.9°F | Resp 19 | Ht 62.75 in | Wt 264.2 lb

## 2018-07-13 DIAGNOSIS — Z90710 Acquired absence of both cervix and uterus: Secondary | ICD-10-CM | POA: Insufficient documentation

## 2018-07-13 DIAGNOSIS — C541 Malignant neoplasm of endometrium: Secondary | ICD-10-CM | POA: Insufficient documentation

## 2018-07-13 DIAGNOSIS — R350 Frequency of micturition: Secondary | ICD-10-CM | POA: Insufficient documentation

## 2018-07-13 DIAGNOSIS — I4891 Unspecified atrial fibrillation: Secondary | ICD-10-CM | POA: Diagnosis not present

## 2018-07-13 DIAGNOSIS — E7800 Pure hypercholesterolemia, unspecified: Secondary | ICD-10-CM | POA: Diagnosis not present

## 2018-07-13 DIAGNOSIS — M1990 Unspecified osteoarthritis, unspecified site: Secondary | ICD-10-CM | POA: Insufficient documentation

## 2018-07-13 DIAGNOSIS — E669 Obesity, unspecified: Secondary | ICD-10-CM | POA: Diagnosis not present

## 2018-07-13 DIAGNOSIS — Z923 Personal history of irradiation: Secondary | ICD-10-CM | POA: Diagnosis not present

## 2018-07-13 DIAGNOSIS — Z90722 Acquired absence of ovaries, bilateral: Secondary | ICD-10-CM

## 2018-07-13 NOTE — Telephone Encounter (Signed)
Called patient to inform fu appt. with Dr. Sondra Come has been moved to 10-20-18 @ 10:15 am , due to the fact that she saw Dr. Denman George today, lvm for a return call

## 2018-07-13 NOTE — Progress Notes (Signed)
Follow-up Note: Gyn-Onc   Helen West 63 y.o. female  Chief Complaint  Patient presents with  . 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 S/p brachytherapy for high/intermediate risk factors completed 01/12/18.  No evidence of recurrence on examination  Plan:  I recommend she follow-up at 3 monthly intervals for symptom review, physical examination and pelvic examination. Pap smear is not recommended in routine endometrial cancer surveillance. After 2 years we will space these visits to every 6 months, and then annually if recurrence has not developed within 5 years. All questions were answered.  We discussed survivorship issues including:  Long term sequelae from surgery Long-term sequelae from radiation Optimal diet Sexual function  HPI: 63 year old white married female gravida 3 seen in consultation request of Dr. Paula Compton regarding management of complex atypical hyperplasia with papillary features.  The patient presented with postmenopausal bleeding and underwent an ultrasound showing an endometrial stripe of 21 mm.  An endometrial biopsy was obtained on October 13, 2017.  Obstetrical history gravida 3.  Patient had a postpartum tubal ligation following her second child.  She has had no other abdominal or pelvic surgery.  She has had a right total hip replacement.  She has atrial fibrillation and is on Xarelto and metoprolol.  On 11/02/17 she underwent robotic assisted total hysterectomy, BSO, SLN biopsy. Final pathology revealed a 5.5cm grade 1 endometrioid adenocarcinoma with 16 of 63mm myometrial invasion and LVSI present. Nodes,adnexa and cervix were negative.  Due to high/intermediate risk factors she was recommended to have adjuvant vaginal brachytherapy for local control.   Interval Hx:  She completed vaginal brachytherapy with with 5 fractions of 6Gy (30Gy total) between  12/16/17-01/12/18. She tolerated treatment well with no complaints or toxicity.  Since surgery she has noted some issues with urinary frequency particularly at night.   Review of Systems:10 point review of systems is negative except as noted in interval history.   Vitals: Blood pressure (!) 149/72, pulse (!) 51, temperature 98.9 F (37.2 C), temperature source Oral, resp. rate 19, height 5' 2.75" (1.594 m), weight 264 lb 3.2 oz (119.8 kg), SpO2 100 %.  Physical Exam: General : The patient is a healthy woman in no acute distress.  HEENT: normocephalic, extraoccular movements normal; neck is supple without thyromegally  Lynphnodes: Supraclavicular and inguinal nodes not enlarged  Abdomen: Obese soft, non-tender, no ascites, no organomegally, no masses, no hernias  Pelvic:  Vaginal cuff normal smooth with no lesions, no bleeding, no masses Rectal: deferred Lower extremities: No edema or varicosities. Normal range of motion      No Known Allergies  Past Medical History:  Diagnosis Date  . A-fib ([place])   . Anemia   . Arthritis   . Cancer ([place])   . Complication of anesthesia    Loss of vocal/voice x 2 months  . Dysrhythmia   . Headache   . High cholesterol   . Obesity   . Post-menopausal bleeding     Past Surgical History:  Procedure Laterality Date  . ROBOTIC ASSISTED TOTAL HYSTERECTOMY WITH BILATERAL SALPINGO OOPHERECTOMY Bilateral 11/02/2017   Procedure: XI ROBOTIC ASSISTED TOTAL HYSTERECTOMY WITH BILATERAL SALPINGO OOPHORECTOMY;  Surgeon: [name], MD;  Location: [facility];  Service: Gynecology;  Laterality: Bilateral;  . SENTINEL NODE BIOPSY N/A 11/02/2017   Procedure: SENTINEL NODE BIOPSY;  Surgeon: [name], MD;  Location: [facility];  Service: Gynecology;  Laterality: N/A;  . TONSILLECTOMY    .  TOTAL HIP ARTHROPLASTY    . TUBAL LIGATION      Current Outpatient Medications  Medication Sig Dispense Refill  . acetaminophen (TYLENOL) 650 MG CR tablet Take 1,950 mg by  mouth daily. Patient takes 3 tablets daily in the morning to equal a 1950mg  a day.(Per Patient)     . atorvastatin (LIPITOR) 10 MG tablet Take 10 mg by mouth daily.    . flecainide (TAMBOCOR) 100 MG tablet Take 100 mg by mouth 2 (two) times daily.     . flecainide (TAMBOCOR) 150 MG tablet Take by mouth.    . metoprolol tartrate (LOPRESSOR) 25 MG tablet Take 12.5 mg by mouth 2 (two) times daily.     Marland Kitchen OVER THE COUNTER MEDICATION Take 1 tablet by mouth daily. Mega Red 750mg  tablet once daily.    . rivaroxaban (XARELTO) 20 MG TABS tablet Take 1 tablet (20 mg total) by mouth daily with supper.     No current facility-administered medications for this visit.     Social History   Socioeconomic History  . Marital status: Married    Spouse name: Not on file  . Number of children: Not on file  . Years of education: Not on file  . Highest education level: Not on file  Occupational History  . Not on file  Social Needs  . Financial resource strain: Not on file  . Food insecurity:    Worry: Not on file    Inability: Not on file  . Transportation needs:    Medical: Not on file    Non-medical: Not on file  Tobacco Use  . Smoking status: Never Smoker  . Smokeless tobacco: Never Used  Substance and Sexual Activity  . Alcohol use: Yes    Comment: occasional social  . Drug use: Never  . Sexual activity: Not on file  Lifestyle  . Physical activity:    Days per week: Not on file    Minutes per session: Not on file  . Stress: Not on file  Relationships  . Social connections:    Talks on phone: Not on file    Gets together: Not on file    Attends religious service: Not on file    Active member of club or organization: Not on file    Attends meetings of clubs or organizations: Not on file    Relationship status: Not on file  . Intimate partner violence:    Fear of current or ex partner: Not on file    Emotionally abused: Not on file    Physically abused: Not on file    Forced sexual  activity: Not on file  Other Topics Concern  . Not on file  Social History Narrative  . Not on file    Family History  Problem Relation Age of Onset  . Aneurysm Mother   . Emphysema Father   . Cancer Paternal Uncle     Thereasa Solo, MD 07/13/2018, 12:46 PM

## 2018-07-13 NOTE — Patient Instructions (Signed)
Please notify Dr Denman George at phone number [phone] if you notice vaginal bleeding, new pelvic or abdominal pains, bloating, feeling full easy, or a change in bladder or bowel function.   Please return to see Dr Sondra Come in September as scheduled.  Please return to see Dr Denman George in December (his office can call up in September to schedule this for you).

## 2018-07-18 ENCOUNTER — Ambulatory Visit: Payer: PPO | Admitting: Radiation Oncology

## 2018-08-31 DIAGNOSIS — I480 Paroxysmal atrial fibrillation: Secondary | ICD-10-CM | POA: Diagnosis not present

## 2018-08-31 DIAGNOSIS — I483 Typical atrial flutter: Secondary | ICD-10-CM | POA: Diagnosis not present

## 2018-08-31 DIAGNOSIS — I10 Essential (primary) hypertension: Secondary | ICD-10-CM | POA: Diagnosis not present

## 2018-08-31 DIAGNOSIS — Z6841 Body Mass Index (BMI) 40.0 and over, adult: Secondary | ICD-10-CM | POA: Diagnosis not present

## 2018-09-05 DIAGNOSIS — E782 Mixed hyperlipidemia: Secondary | ICD-10-CM | POA: Diagnosis not present

## 2018-09-05 DIAGNOSIS — Z6841 Body Mass Index (BMI) 40.0 and over, adult: Secondary | ICD-10-CM | POA: Diagnosis not present

## 2018-09-05 DIAGNOSIS — Z0000 Encounter for general adult medical examination without abnormal findings: Secondary | ICD-10-CM | POA: Diagnosis not present

## 2018-09-05 DIAGNOSIS — Z1231 Encounter for screening mammogram for malignant neoplasm of breast: Secondary | ICD-10-CM | POA: Diagnosis not present

## 2018-09-05 DIAGNOSIS — I480 Paroxysmal atrial fibrillation: Secondary | ICD-10-CM | POA: Diagnosis not present

## 2018-09-07 ENCOUNTER — Encounter: Payer: Self-pay | Admitting: Internal Medicine

## 2018-09-07 DIAGNOSIS — R001 Bradycardia, unspecified: Secondary | ICD-10-CM | POA: Diagnosis not present

## 2018-09-07 DIAGNOSIS — I480 Paroxysmal atrial fibrillation: Secondary | ICD-10-CM | POA: Diagnosis not present

## 2018-09-14 DIAGNOSIS — R799 Abnormal finding of blood chemistry, unspecified: Secondary | ICD-10-CM | POA: Diagnosis not present

## 2018-10-03 ENCOUNTER — Telehealth: Payer: Self-pay | Admitting: *Deleted

## 2018-10-03 NOTE — Telephone Encounter (Signed)
CALLED PATIENT TO ALTER FU APPT. ON 10-20-18 DUE TO DR. KINARD BEING ON VACATION, RESCHEDULED FOR 10-27-18 @ 8 AM, PATIENT AGREED TO NEW TIME AND DATE

## 2018-10-12 DIAGNOSIS — J060 Acute laryngopharyngitis: Secondary | ICD-10-CM | POA: Diagnosis not present

## 2018-10-19 ENCOUNTER — Other Ambulatory Visit: Payer: Self-pay

## 2018-10-19 ENCOUNTER — Ambulatory Visit (INDEPENDENT_AMBULATORY_CARE_PROVIDER_SITE_OTHER): Payer: PPO | Admitting: Internal Medicine

## 2018-10-19 ENCOUNTER — Encounter: Payer: Self-pay | Admitting: Internal Medicine

## 2018-10-19 VITALS — BP 144/73 | HR 51 | Temp 97.0°F | Ht 62.0 in | Wt 262.6 lb

## 2018-10-19 DIAGNOSIS — E782 Mixed hyperlipidemia: Secondary | ICD-10-CM

## 2018-10-19 DIAGNOSIS — I480 Paroxysmal atrial fibrillation: Secondary | ICD-10-CM

## 2018-10-19 NOTE — Progress Notes (Signed)
OFFICE NOTE  Chief Complaint:  Establish cardiologist  Primary Care Physician: [name], MD  HPI:  Helen West is a 63 y.o. female with a past medial history significant for atrial fibrillation since 2016.  She is interesting in transferring her care to [facility] due to issues with communication regarding the office staff.  As for her A. fib, this was incidentally discovered during work-up for hip replacement surgery.  She also has history of anemia, arthritis, uterine cancer, dyslipidemia, obesity and headaches.  She has been treated predominantly on flecainide since that time, more recently with metoprolol and diltiazem.  She reports that she continues to have episodes of breakthrough atrial fibrillation which may occur a few times a month.  When she does breakthrough her rates are quite fast.  She was advised to take 2 extra tablets of flecainide by her cardiologist Dr. Ola Spurr, but that does not always improve her symptoms.  Recently she said she was advised to go up to 150 mg twice daily, however she had already done that with side effects and decrease the dose to 100 mg twice daily.  It was then advised to increase her diltiazem to 180 mg daily however she is currently only taking 120 mg daily due to fatigue and low heart rate.  Generally she says her heart rate runs in the upper 40s to low 50s.  She denies any chest pain or worsening shortness of breath.  We did an Epworth Sleepiness Scale score today which was 2, and although she seems to have risk factors included morbid obesity for sleep apnea, she says her husband notes that she does not snore, she feels generally rested during the day and does not have any other concerning Symptoms.  PMHx:  Past Medical History:  Diagnosis Date  . A-fib ([place])   . Anemia   . Arthritis   . Cancer ([place])   . Complication of anesthesia    Loss of vocal/voice x 2 months  . Dysrhythmia   . Headache   . High cholesterol   . Obesity   .  Post-menopausal bleeding     Past Surgical History:  Procedure Laterality Date  . ROBOTIC ASSISTED TOTAL HYSTERECTOMY WITH BILATERAL SALPINGO OOPHERECTOMY Bilateral 11/02/2017   Procedure: XI ROBOTIC ASSISTED TOTAL HYSTERECTOMY WITH BILATERAL SALPINGO OOPHORECTOMY;  Surgeon: [name], MD;  Location: [facility];  Service: Gynecology;  Laterality: Bilateral;  . SENTINEL NODE BIOPSY N/A 11/02/2017   Procedure: SENTINEL NODE BIOPSY;  Surgeon: [name], MD;  Location: [facility];  Service: Gynecology;  Laterality: N/A;  . TONSILLECTOMY    . TOTAL HIP ARTHROPLASTY    . TUBAL LIGATION      FAMHx:  Family History  Problem Relation Age of Onset  . Aneurysm Mother   . Emphysema Father   . Cancer Paternal Uncle     SOCHx:   reports that she has never smoked. She has never used smokeless tobacco. She reports current alcohol use. She reports that she does not use drugs.  ALLERGIES:  No Known Allergies  ROS: Pertinent items noted in HPI and remainder of comprehensive ROS otherwise negative.  HOME MEDS: Current Outpatient Medications on File Prior to Visit  Medication Sig Dispense Refill  . atorvastatin (LIPITOR) 10 MG tablet Take 10 mg by mouth daily.    . flecainide (TAMBOCOR) 100 MG tablet Take 100 mg by mouth 2 (two) times daily.     . metoprolol tartrate (LOPRESSOR) 25 MG tablet Take 12.5 mg by mouth  2 (two) times daily.     . rivaroxaban (XARELTO) 20 MG TABS tablet Take 1 tablet (20 mg total) by mouth daily with supper.     No current facility-administered medications on file prior to visit.     LABS/IMAGING: No results found for this or any previous visit (from the past 48 hour(s)). No results found.  LIPID PANEL: No results found for: CHOL, TRIG, HDL, CHOLHDL, VLDL, LDLCALC, LDLDIRECT   WEIGHTS: Wt Readings from Last 3 Encounters:  10/19/18 262 lb 9.6 oz (119.1 kg)  07/13/18 264 lb 3.2 oz (119.8 kg)  02/14/18 257 lb (116.6 kg)    VITALS: BP (!) 144/73   Pulse (!) 51    Temp (!) 97 F (36.1 C)   Ht 5\' 2"  (1.575 m)   Wt 262 lb 9.6 oz (119.1 kg)   SpO2 96%   BMI 48.03 kg/m   EXAM: General appearance: alert, no distress and morbidly obese Neck: no carotid bruit, no JVD and thyroid not enlarged, symmetric, no tenderness/mass/nodules Lungs: clear to auscultation bilaterally Heart: regular rate and rhythm Abdomen: soft, non-tender; bowel sounds normal; no masses,  no organomegaly Extremities: extremities normal, atraumatic, no cyanosis or edema Pulses: 2+ and symmetric Skin: Skin color, texture, turgor normal. No rashes or lesions Neurologic: Grossly normal Psych: Pleasant  EKG: Sinus bradycardia 51, nonspecific T wave changes, QTC 420 ms- personally reviewed  ASSESSMENT: 1. Paroxysmal atrial fibrillation 2. CHADVASC score 2-on Xarelto 3. Fatigue   PLAN: 1.   Helen West is describing continued fatigue and medications but still has breakthrough atrial fibrillation.  She was not tolerant of an increased dose of flecainide and notes that on higher dose diltiazem she also is significantly fatigued.  Heart rate generally runs in the 40s and 50s.  She says she likes to dance but notes that she tires very easily doing that.  I suspect that this may be related to multiple medications and low heart rate.  I advised her today to discontinue the diltiazem.  Of course she may be at higher risk of breakthrough arrhythmias.  We discussed other options including alternate antiarrhythmic therapy.  I think she would be a good candidate for dofetilide however this would require hospital admission and washing out of the flecainide.  She will consider this.  She is not likely a good candidate for ablation, with predicted lower success rates given her morbid obesity.  She will contact us if she wishes to move forward with dofetilide and will plan otherwise routine follow-up in 3 months.  She was also made aware of our A. fib clinic options which would allow her to be seen  oftentimes on a same-day basis.  Pixie Casino, MD, [facility], [place] Director of the Advanced Lipid Disorders &  Cardiovascular Risk Reduction Clinic Diplomate of the American Board of Clinical Lipidology Attending Cardiologist  Direct Dial: [phone]  Fax: [phone]  Website:  [email or web address] 10/19/2018, 5:05 PM

## 2018-10-19 NOTE — Patient Instructions (Signed)
Medication Instructions:  STOP diltiazem   ** Tikosyn -- if you decide you want to start this therapy, please call our office  If you need a refill on your cardiac medications before your next appointment, please call your pharmacy.   Follow-Up: At [facility], you and your health needs are our priority.  As part of our continuing mission to provide you with exceptional heart care, we have created designated Provider Care Teams.  These Care Teams include your primary Cardiologist (physician) and Advanced Practice Providers (APPs -  Physician Assistants and Nurse Practitioners) who all work together to provide you with the care you need, when you need it. You will need a follow up appointment in 3 months. You may see Dr. Debara Pickett or one of the following Advanced Practice Providers on your designated Care Team: Almyra Deforest, [place] . Fabian Sharp, PA-C

## 2018-10-20 ENCOUNTER — Ambulatory Visit: Payer: PPO | Admitting: Radiation Oncology

## 2018-10-25 ENCOUNTER — Telehealth: Payer: Self-pay | Admitting: *Deleted

## 2018-10-25 NOTE — Telephone Encounter (Signed)
CALLED PATIENT TO ALTER FU ON 10-27-18 DUE TO DR. KINARD BEING IN THE OR, RESCHEDULED FOR 11-03-18 @ 8:30 AM, PATIENT AGREED TO NEW TIME AND DATE

## 2018-10-27 ENCOUNTER — Ambulatory Visit: Payer: PPO | Admitting: Radiation Oncology

## 2018-11-03 ENCOUNTER — Encounter: Payer: Self-pay | Admitting: Radiation Oncology

## 2018-11-03 ENCOUNTER — Ambulatory Visit
Admission: RE | Admit: 2018-11-03 | Discharge: 2018-11-03 | Disposition: A | Discharge status: Home / Self Care | Payer: PPO | Source: Ambulatory Visit | Attending: Radiation Oncology | Admitting: Radiation Oncology

## 2018-11-03 ENCOUNTER — Other Ambulatory Visit: Payer: Self-pay

## 2018-11-03 VITALS — BP 162/98 | HR 57 | Temp 97.8°F | Resp 18 | Ht 62.0 in | Wt 264.8 lb

## 2018-11-03 DIAGNOSIS — Z79899 Other long term (current) drug therapy: Secondary | ICD-10-CM | POA: Diagnosis not present

## 2018-11-03 DIAGNOSIS — Z8542 Personal history of malignant neoplasm of other parts of uterus: Secondary | ICD-10-CM | POA: Insufficient documentation

## 2018-11-03 DIAGNOSIS — Z923 Personal history of irradiation: Secondary | ICD-10-CM | POA: Diagnosis not present

## 2018-11-03 DIAGNOSIS — C541 Malignant neoplasm of endometrium: Secondary | ICD-10-CM

## 2018-11-03 DIAGNOSIS — C548 Malignant neoplasm of overlapping sites of corpus uteri: Secondary | ICD-10-CM

## 2018-11-03 DIAGNOSIS — Z08 Encounter for follow-up examination after completed treatment for malignant neoplasm: Secondary | ICD-10-CM | POA: Diagnosis not present

## 2018-11-03 NOTE — Patient Instructions (Signed)
Coronavirus (COVID-19) Are you at risk?  Are you at risk for the Coronavirus (COVID-19)?  To be considered HIGH RISK for Coronavirus (COVID-19), you have to meet the following criteria:  . Traveled to [place], [place], [place], [place] or [place]; or in the [place] to [place], [place], [place], or [place]; and have fever, cough, and shortness of breath within the last 2 weeks of travel OR . Been in close contact with a person diagnosed with COVID-19 within the last 2 weeks and have fever, cough, and shortness of breath . IF YOU DO NOT MEET THESE CRITERIA, YOU ARE CONSIDERED LOW RISK FOR COVID-19.  What to do if you are HIGH RISK for COVID-19?  . If you are having a medical emergency, call 911. . Seek medical care right away. Before you go to a doctor's office, urgent care or emergency department, call ahead and tell them about your recent travel, contact with someone diagnosed with COVID-19, and your symptoms. You should receive instructions from your physician's office regarding next steps of care.  . When you arrive at healthcare provider, tell the healthcare staff immediately you have returned from visiting [place], [place], [place], [place] or [place]; or traveled in the [place] to [place], [place], [place], or [place]; in the last two weeks or you have been in close contact with a person diagnosed with COVID-19 in the last 2 weeks.   . Tell the health care staff about your symptoms: fever, cough and shortness of breath. . After you have been seen by a medical provider, you will be either: o Tested for (COVID-19) and discharged home on quarantine except to seek medical care if symptoms worsen, and asked to  - Stay home and avoid contact with others until you get your results (4-5 days)  - Avoid travel on public transportation if possible (such as bus, train, or airplane) or o Sent to the Emergency Department by EMS for evaluation, COVID-19 testing, and possible  admission depending on your condition and test results.  What to do if you are LOW RISK for COVID-19?  Reduce your risk of any infection by using the same precautions used for avoiding the common cold or flu:  . Wash your hands often with soap and warm water for at least 20 seconds.  If soap and water are not readily available, use an alcohol-based hand sanitizer with at least 60% alcohol.  . If coughing or sneezing, cover your mouth and nose by coughing or sneezing into the elbow areas of your shirt or coat, into a tissue or into your sleeve (not your hands). . Avoid shaking hands with others and consider head nods or verbal greetings only. . Avoid touching your eyes, nose, or mouth with unwashed hands.  . Avoid close contact with people who are sick. . Avoid places or events with large numbers of people in one location, like concerts or sporting events. . Carefully consider travel plans you have or are making. . If you are planning any travel outside or inside the [place], visit the CDC's Travelers' Health webpage for the latest health notices. . If you have some symptoms but not all symptoms, continue to monitor at home and seek medical attention if your symptoms worsen. . If you are having a medical emergency, call 911.   ADDITIONAL HEALTHCARE OPTIONS FOR PATIENTS  [place] Telehealth / e-Visit: [email or web address]         [facility]: [phone]  [place]   [facility]: [phone]                   [facility]: [phone]   

## 2018-11-03 NOTE — Progress Notes (Signed)
  Radiation Oncology         [phone] ________________________________  Name: Helen West MRN: [id number]  Date: 11/03/2018  DOB: June 01, 1955  Follow-Up Visit Note  CC: [name], MD  [name], MD    ICD-10-CM   1. Malignant neoplasm of overlapping sites of body of uterus ([place])  C54.8   2. Endometrial cancer ([place])  C54.1     Diagnosis:   Stage IB (pT1b, pN0) Invasive Endometrioid Adenocarcinoma, Grade 1   Interval Since Last Radiation:  9 months, 2 weeks 11/14, 11/21, 11/27, 12/4, 01/12/18:  Vaginal cuff, 6 Gy in 5 fractions for a total dose of 30 Gy  Narrative:  The patient returns today for routine follow-up. She last saw Dr. Denman George on 07/13/2018, with no evidence of recurrence on exam at that time.  On review of systems, she Is without complaints today. She denies pain, dysuria or hematuria, vaginal discharge or bleeding, rectal bleeding, diarrhea or constipation, abdominal bloating, and nausea or vomiting.  She is not using her vaginal dilator.             ALLERGIES:  has No Known Allergies.  Meds: Current Outpatient Medications  Medication Sig Dispense Refill  . atorvastatin (LIPITOR) 10 MG tablet Take 10 mg by mouth daily.    . flecainide (TAMBOCOR) 100 MG tablet Take 100 mg by mouth 2 (two) times daily.     . metoprolol tartrate (LOPRESSOR) 25 MG tablet Take 12.5 mg by mouth 2 (two) times daily.     . Misc Natural Products (TART CHERRY ADVANCED) CAPS Take 1 capsule by mouth.    . rivaroxaban (XARELTO) 20 MG TABS tablet Take 1 tablet (20 mg total) by mouth daily with supper.     No current facility-administered medications for this encounter.     Physical Findings: The patient is in no acute distress. Patient is alert and oriented.  height is 5\' 2"  (1.575 m) and weight is 264 lb 12.8 oz (120.1 kg). Her temporal temperature is 97.8 F (36.6 C). Her blood pressure is 162/98 (abnormal) and her pulse is 57 (abnormal). Her respiration is 18 and oxygen saturation is  99%. .  No significant changes. Lungs are clear to auscultation bilaterally. Heart has regular rate and rhythm. No palpable cervical, supraclavicular, or axillary adenopathy. Abdomen soft, non-tender, normal bowel sounds. On pelvic examination the external genitalia are unremarkable.  A speculum exam was performed.  No adhesions noted.  No mucosal lesions noted in the vaginal vault.  The vaginal cuff is intact.  Bimanual examination no pelvic masses appreciated.  Lab Findings: Lab Results  Component Value Date   WBC 8.6 11/03/2017   HGB 12.5 11/03/2017   HCT 39.0 11/03/2017   MCV 96.5 11/03/2017   PLT 285 11/03/2017    Radiographic Findings: No results found.  Impression:  Stage IB (pT1b, pN0) Invasive Endometrioid Adenocarcinoma, Grade 1.    No evidence of recurrence on clinical exam today.  Plan:  Follow-up in radiation oncology in 6 months. Follow-up with Dr. Denman George in 3 months.  ____________________________________   Blair Promise, PhD, MD  This document serves as a record of services personally performed by Gery Pray, MD. It was created on his behalf by Wilburn Mylar, a trained medical scribe. The creation of this record is based on the scribe's personal observations and the provider's statements to them. This document has been checked and approved by the attending provider.

## 2018-11-03 NOTE — Progress Notes (Signed)
Pt presents today for f/u with Dr. Sondra Come. Pt denies c/o pain. Pt denies dysuria/hematuria. Pt denies vaginal bleeding/discharge. Pt denies rectal bleeding, diarrhea/constipation. Pt denies abdominal bloating, N/V.  BP (!) 162/98 (BP Location: Left Arm, Patient Position: Sitting)   Pulse (!) 57   Temp 97.8 F (36.6 C) (Temporal)   Resp 18   Ht 5\' 2"  (1.575 m)   Wt 264 lb 12.8 oz (120.1 kg)   SpO2 99%   BMI 48.43 kg/m   Wt Readings from Last 3 Encounters:  11/03/18 264 lb 12.8 oz (120.1 kg)  10/19/18 262 lb 9.6 oz (119.1 kg)  07/13/18 264 lb 3.2 oz (119.8 kg)   Loma Sousa, RN BSN

## 2018-12-05 ENCOUNTER — Telehealth: Payer: Self-pay | Admitting: Internal Medicine

## 2018-12-05 NOTE — Telephone Encounter (Signed)
Returned call to patient of Dr. Debara Pickett who reports headaches for the last 3 weeks off and on. She reports on 10/31, she took her flecainide and metoprolol (increased herself to 25mg ) but this did not "do anything until 2am in the morning". She reports she slept all day yesterday. She feels her heart racing now. Her BP is 167/128 and HR 130bpm while on the phone. She feels "scared" and uneasy. She has taken flecainide and metoprolol 12.5mg  today as prescribed. She would like to know if she should increase the dose of one or both medication   Added her on to see MD 11/3 @ 3:45pm  Advised her to seek ED eval at [facility] for high BP, high HR with chest pain/SOB/stroke-like symptoms  Routed to MD to advise

## 2018-12-05 NOTE — Telephone Encounter (Signed)
Patient called with MD advice 

## 2018-12-05 NOTE — Telephone Encounter (Signed)
Sounds like she is in afib - we can discuss tomorrow if she does not go to the ER and get admitted.

## 2018-12-05 NOTE — Telephone Encounter (Signed)
Pt c/o BP issue: STAT if pt c/o blurred vision, one-sided weakness or slurred speech  1. What are your last 5 BP readings?  10/31: 180/102 HR 110-115  2. Are you having any other symptoms (ex. Dizziness, headache, blurred vision, passed out)? Headaches that come and go  3. What is your BP issue? Having more frequent issues with her BP and HR. Has not taken any medication.  States she is fine today.

## 2018-12-06 ENCOUNTER — Ambulatory Visit: Payer: PPO | Admitting: Internal Medicine

## 2018-12-06 ENCOUNTER — Encounter: Payer: Self-pay | Admitting: Internal Medicine

## 2018-12-06 ENCOUNTER — Other Ambulatory Visit: Payer: Self-pay

## 2018-12-06 VITALS — BP 155/60 | HR 60 | Ht 62.0 in | Wt 260.8 lb

## 2018-12-06 DIAGNOSIS — I10 Essential (primary) hypertension: Secondary | ICD-10-CM | POA: Diagnosis not present

## 2018-12-06 DIAGNOSIS — I4891 Unspecified atrial fibrillation: Secondary | ICD-10-CM | POA: Diagnosis not present

## 2018-12-06 NOTE — Patient Instructions (Signed)
Medication Instructions:  Your physician recommends that you continue on your current medications as directed. Please refer to the Current Medication list given to you today.  *If you need a refill on your cardiac medications before your next appointment, please call your pharmacy*    Testing/Procedures: Your provider has ordered a ZIO monitor. You will wear this for 14 days.   1. Avoid showering during the first 24 hours of wearing the monitor. 2. Avoid excessive sweating to help maximize wear time. 3. Do not submerge the device, no hot tubs, and no swimming pools. 4. Keep any lotions or oils away from the patch. 5. After 24 hours you may shower with the patch on. Take brief showers with your back facing the shower head.  6. Do not remove patch once it has been placed because that will interrupt data and decrease adhesive wear time. 7. Push the button when you have any symptoms and write down what you were feeling. 8. Once you have completed wearing your monitor, remove and place into box which has postage paid and place in your outgoing mailbox.  9. If for some reason you have misplaced your box then call our office and we can provide another box and/or mail it off for you.   Follow-Up: At [facility], you and your health needs are our priority.  As part of our continuing mission to provide you with exceptional heart care, we have created designated Provider Care Teams.  These Care Teams include your primary Cardiologist (physician) and Advanced Practice Providers (APPs -  Physician Assistants and Nurse Practitioners) who all work together to provide you with the care you need, when you need it.  Your next appointment:  As scheduled with Dr. Debara Pickett  Dr. Debara Pickett has referred you to a cardiac electrophysiologist (Dr. Allegra Lai)  [address] - [address]rd Floor

## 2018-12-06 NOTE — Progress Notes (Signed)
OFFICE NOTE  Chief Complaint:  Establish cardiologist  Primary Care Physician: [name], MD  HPI:  Helen West is a 63 y.o. female with a past medial history significant for atrial fibrillation since 2016.  She is interesting in transferring her care to [facility] due to issues with communication regarding the office staff.  As for her A. fib, this was incidentally discovered during work-up for hip replacement surgery.  She also has history of anemia, arthritis, uterine cancer, dyslipidemia, obesity and headaches.  She has been treated predominantly on flecainide since that time, more recently with metoprolol and diltiazem.  She reports that she continues to have episodes of breakthrough atrial fibrillation which may occur a few times a month.  When she does breakthrough her rates are quite fast.  She was advised to take 2 extra tablets of flecainide by her cardiologist Dr. Ola Spurr, but that does not always improve her symptoms.  Recently she said she was advised to go up to 150 mg twice daily, however she had already done that with side effects and decrease the dose to 100 mg twice daily.  It was then advised to increase her diltiazem to 180 mg daily however she is currently only taking 120 mg daily due to fatigue and low heart rate.  Generally she says her heart rate runs in the upper 40s to low 50s.  She denies any chest pain or worsening shortness of breath.  We did an Epworth Sleepiness Scale score today which was 2, and although she seems to have risk factors included morbid obesity for sleep apnea, she says her husband notes that she does not snore, she feels generally rested during the day and does not have any other concerning Symptoms.  12/06/2018  Helen West is seen today for an acute add-on.  She was scheduled for follow-up in December however has been having tachycardia and hypertension.  She says her heart rate the other day was up in the 120s to 130s.  This was sustained.   She took extra tablets of flecainide as well as her beta-blocker with eventual improvement in her symptoms.  An EKG today however shows she is in sinus rhythm.  I am highly suspicious though she is having breakthrough atrial fibrillation.  PMHx:  Past Medical History:  Diagnosis Date  . A-fib ([place])   . Anemia   . Arthritis   . Cancer ([place])   . Complication of anesthesia    Loss of vocal/voice x 2 months  . Dysrhythmia   . Headache   . High cholesterol   . Obesity   . Post-menopausal bleeding     Past Surgical History:  Procedure Laterality Date  . ROBOTIC ASSISTED TOTAL HYSTERECTOMY WITH BILATERAL SALPINGO OOPHERECTOMY Bilateral 11/02/2017   Procedure: XI ROBOTIC ASSISTED TOTAL HYSTERECTOMY WITH BILATERAL SALPINGO OOPHORECTOMY;  Surgeon: [name], MD;  Location: [facility];  Service: Gynecology;  Laterality: Bilateral;  . SENTINEL NODE BIOPSY N/A 11/02/2017   Procedure: SENTINEL NODE BIOPSY;  Surgeon: [name], MD;  Location: [facility];  Service: Gynecology;  Laterality: N/A;  . TONSILLECTOMY    . TOTAL HIP ARTHROPLASTY    . TUBAL LIGATION      FAMHx:  Family History  Problem Relation Age of Onset  . Aneurysm Mother   . Emphysema Father   . Cancer Paternal Uncle     SOCHx:   reports that she has never smoked. She has never used smokeless tobacco. She reports current alcohol use. She reports that  she does not use drugs.  ALLERGIES:  No Known Allergies  ROS: Pertinent items noted in HPI and remainder of comprehensive ROS otherwise negative.  HOME MEDS: Current Outpatient Medications on File Prior to Visit  Medication Sig Dispense Refill  . atorvastatin (LIPITOR) 10 MG tablet Take 10 mg by mouth daily.    . flecainide (TAMBOCOR) 100 MG tablet Take 100 mg by mouth 2 (two) times daily.     . metoprolol tartrate (LOPRESSOR) 25 MG tablet Take 12.5 mg by mouth 2 (two) times daily.     . Misc Natural Products (TART CHERRY ADVANCED) CAPS Take 1 capsule by mouth.    .  rivaroxaban (XARELTO) 20 MG TABS tablet Take 1 tablet (20 mg total) by mouth daily with supper.     No current facility-administered medications on file prior to visit.     LABS/IMAGING: No results found for this or any previous visit (from the past 48 hour(s)). No results found.  LIPID PANEL: No results found for: CHOL, TRIG, HDL, CHOLHDL, VLDL, LDLCALC, LDLDIRECT   WEIGHTS: Wt Readings from Last 3 Encounters:  12/06/18 260 lb 12.8 oz (118.3 kg)  11/03/18 264 lb 12.8 oz (120.1 kg)  10/19/18 262 lb 9.6 oz (119.1 kg)    VITALS: BP (!) 155/60   Pulse 60   Ht 5\' 2"  (1.575 m)   Wt 260 lb 12.8 oz (118.3 kg)   SpO2 97%   BMI 47.70 kg/m   EXAM: General appearance: alert, no distress and morbidly obese Neck: no carotid bruit, no JVD and thyroid not enlarged, symmetric, no tenderness/mass/nodules Lungs: clear to auscultation bilaterally Heart: regular rate and rhythm Abdomen: soft, non-tender; bowel sounds normal; no masses,  no organomegaly Extremities: extremities normal, atraumatic, no cyanosis or edema Pulses: 2+ and symmetric Skin: Skin color, texture, turgor normal. No rashes or lesions Neurologic: Grossly normal Psych: Pleasant  EKG: Normal sinus rhythm at 60-personally reviewed  ASSESSMENT: 1. Paroxysmal atrial fibrillation -recent possible RVR, on flecainide 2. CHADVASC score 2-on Xarelto 3. Uncontrolled hypertension   PLAN: 1.   Helen West is been tachypalpitations highly concerning for breakthrough atrial fibrillation despite the flecainide.  I like to place a 2-week monitor to see if we can pick up 1 more episode since it is happening frequently.  We will also refer her to cardiac EP to consider escalating her antiarrhythmics, possibly to dofetilide.  Not sure she is an ideal candidate for ablation, especially given her weight, however this can be a consideration.  I suspect the blood pressure is related to her atrial fibrillation but may also need to be  addressed.  We will plan to keep her follow-up appointment in December.  Pixie Casino, MD, [facility], [place] Director of the Advanced Lipid Disorders &  Cardiovascular Risk Reduction Clinic Diplomate of the American Board of Clinical Lipidology Attending Cardiologist  Direct Dial: [phone]  Fax: [phone]  Website:  [email or web address] 12/06/2018, 4:53 PM

## 2018-12-07 ENCOUNTER — Encounter: Payer: Self-pay | Admitting: Internal Medicine

## 2018-12-12 DIAGNOSIS — Z1231 Encounter for screening mammogram for malignant neoplasm of breast: Secondary | ICD-10-CM | POA: Diagnosis not present

## 2018-12-21 ENCOUNTER — Ambulatory Visit (INDEPENDENT_AMBULATORY_CARE_PROVIDER_SITE_OTHER): Payer: PPO

## 2018-12-21 DIAGNOSIS — I4891 Unspecified atrial fibrillation: Secondary | ICD-10-CM | POA: Diagnosis not present

## 2018-12-22 ENCOUNTER — Other Ambulatory Visit: Payer: Self-pay

## 2018-12-27 ENCOUNTER — Institutional Professional Consult (permissible substitution): Payer: PPO | Admitting: Cardiology

## 2018-12-27 DIAGNOSIS — Z20828 Contact with and (suspected) exposure to other viral communicable diseases: Secondary | ICD-10-CM | POA: Diagnosis not present

## 2019-01-06 ENCOUNTER — Telehealth: Payer: Self-pay | Admitting: *Deleted

## 2019-01-06 NOTE — Telephone Encounter (Signed)
CALLED PATIENT TO INFORM OF FU APPT. WITH  DR. Denman George ON 02-21-19 - ARRIVAL TIME- 2 PM, SPOKE WITH PATIENT AND SHE IS AWARE OF THIS APPT.

## 2019-01-14 DIAGNOSIS — I4891 Unspecified atrial fibrillation: Secondary | ICD-10-CM | POA: Diagnosis not present

## 2019-01-18 ENCOUNTER — Telehealth: Payer: Self-pay | Admitting: Cardiology

## 2019-01-18 ENCOUNTER — Telehealth (INDEPENDENT_AMBULATORY_CARE_PROVIDER_SITE_OTHER): Payer: PPO | Admitting: Internal Medicine

## 2019-01-18 ENCOUNTER — Encounter: Payer: Self-pay | Admitting: Internal Medicine

## 2019-01-18 DIAGNOSIS — I4891 Unspecified atrial fibrillation: Secondary | ICD-10-CM | POA: Diagnosis not present

## 2019-01-18 DIAGNOSIS — I472 Ventricular tachycardia: Secondary | ICD-10-CM

## 2019-01-18 DIAGNOSIS — I10 Essential (primary) hypertension: Secondary | ICD-10-CM

## 2019-01-18 DIAGNOSIS — I4729 Other ventricular tachycardia: Secondary | ICD-10-CM

## 2019-01-18 NOTE — Progress Notes (Signed)
Virtual Visit via Telephone Note   This visit type was conducted due to national recommendations for restrictions regarding the COVID-19 Pandemic (e.g. social distancing) in an effort to limit this patient's exposure and mitigate transmission in our community.  Due to her co-morbid illnesses, this patient is at least at moderate risk for complications without adequate follow up.  This format is felt to be most appropriate for this patient at this time.  The patient did not have access to video technology/had technical difficulties with video requiring transitioning to audio format only (telephone).  All issues noted in this document were discussed and addressed.  No physical exam could be performed with this format.  Please refer to the patient's chart for her  consent to telehealth for [facility]   Evaluation Performed:  Telephone follow-up  Date:  01/18/2019   ID:  Helen West, DOB [birth year], MRN [id number]  Patient Location:  [place] [address]  Provider location:   [address], [place], [place] [address]  PCP:  [name], MD  Cardiologist:  No primary care provider on file. Electrophysiologist:  None   Chief Complaint:  Recurrent palpitations  History of Present Illness:    Helen West is a 63 y.o. female who presents via audio/video conferencing for a telehealth visit today.  Helen West is seen today for an acute add-on.  She was scheduled for follow-up in December however has been having tachycardia and hypertension.  She says her heart rate the other day was up in the 120s to 130s.  This was sustained.  She took extra tablets of flecainide as well as her beta-blocker with eventual improvement in her symptoms.  An EKG today however shows she is in sinus rhythm.  I am highly suspicious though she is having breakthrough atrial fibrillation.   01/18/2019  Helen West is seen today for virtual follow-up.  I read her monitor today  which showed breakthrough A. fib with RVR as well as some wide-complex tachycardia which could be A. fib with aberrancy versus nonsustained VT.  I am favoring the latter.  She is on flecainide.  She reports symptomatic palpitations and at times cannot get her heart rate to come down.  Based on this I recommended referral to cardiac EP.  This is scheduled for tomorrow with Dr. Curt Bears.  She is anticoagulated on Xarelto.  The patient does not have symptoms concerning for COVID-19 infection (fever, chills, cough, or new SHORTNESS OF BREATH).    Prior CV studies:   The following studies were reviewed today:  Chart reviewed Monitor reviewed  PMHx:  Past Medical History:  Diagnosis Date  . A-fib ([place])   . Anemia   . Arthritis   . Cancer ([place])   . Complication of anesthesia    Loss of vocal/voice x 2 months  . Dysrhythmia   . Headache   . High cholesterol   . Obesity   . Post-menopausal bleeding     Past Surgical History:  Procedure Laterality Date  . ROBOTIC ASSISTED TOTAL HYSTERECTOMY WITH BILATERAL SALPINGO OOPHERECTOMY Bilateral 11/02/2017   Procedure: XI ROBOTIC ASSISTED TOTAL HYSTERECTOMY WITH BILATERAL SALPINGO OOPHORECTOMY;  Surgeon: [name], MD;  Location: [facility];  Service: Gynecology;  Laterality: Bilateral;  . SENTINEL NODE BIOPSY N/A 11/02/2017   Procedure: SENTINEL NODE BIOPSY;  Surgeon: [name], MD;  Location: [facility];  Service: Gynecology;  Laterality: N/A;  . TONSILLECTOMY    . TOTAL HIP ARTHROPLASTY    .  TUBAL LIGATION      FAMHx:  Family History  Problem Relation Age of Onset  . Aneurysm Mother   . Emphysema Father   . Cancer Paternal Uncle     SOCHx:   reports that she has never smoked. She has never used smokeless tobacco. She reports current alcohol use. She reports that she does not use drugs.  ALLERGIES:  No Known Allergies  MEDS:  No outpatient medications have been marked as taking for the 01/18/19 encounter (Telemedicine) with Pixie Casino, MD.     ROS: Pertinent items noted in HPI and remainder of comprehensive ROS otherwise negative.  Labs/Other Tests and Data Reviewed:    Recent Labs: No results found for requested labs within last 8760 hours.   Recent Lipid Panel No results found for: CHOL, TRIG, HDL, CHOLHDL, LDLCALC, LDLDIRECT  Wt Readings from Last 3 Encounters:  12/06/18 260 lb 12.8 oz (118.3 kg)  11/03/18 264 lb 12.8 oz (120.1 kg)  10/19/18 262 lb 9.6 oz (119.1 kg)     Exam:    Vital Signs:  There were no vitals taken for this visit.   Exam not performed due to telephone visit  ASSESSMENT & PLAN:    1. Paroxysmal atrial fibrillation -recent possible RVR, on flecainide 2. NSVT 3. CHADVASC score 2-on Xarelto 4. Uncontrolled hypertension  Helen West has recurrent A. fib with RVR despite flecainide and possible NSVT versus A. fib with aberrancy.  She is anticoagulated on Xarelto and remains symptomatic with her palpitations.  Based on this she has been referred to cardiac EP and has an appointment tomorrow with Dr. Curt Bears to discuss possible ablation.  COVID-19 Education: The signs and symptoms of COVID-19 were discussed with the patient and how to seek care for testing (follow up with PCP or arrange E-visit).  The importance of social distancing was discussed today.  Patient Risk:   After full review of this patients clinical status, I feel that they are at least moderate risk at this time.  Time:   Today, I have spent 15 minutes with the patient with telehealth technology discussing monitor results    Medication Adjustments/Labs and Tests Ordered: Current medicines are reviewed at length with the patient today.  Concerns regarding medicines are outlined above.   Tests Ordered: No orders of the defined types were placed in this encounter.   Medication Changes: No orders of the defined types were placed in this encounter.   Disposition:  in 6 month(s)  Pixie Casino, MD,  [facility], [place] Director of the Advanced Lipid Disorders &  Cardiovascular Risk Reduction Clinic Diplomate of the American Board of Clinical Lipidology Attending Cardiologist  Direct Dial: [phone]  Fax: [phone]  Website:  [email or web address]  Pixie Casino, MD  01/18/2019 10:59 AM

## 2019-01-18 NOTE — Telephone Encounter (Signed)
Left detailed message informing her of current policy during Covid.  Recommended having husband on facetime/speaker phone so that he can be part of the visit without being present in clinic room. Asked pt to call back if she would like to discuss this further or has more to add to reasoning of needing accompaniment to appt

## 2019-01-18 NOTE — Telephone Encounter (Signed)
New Message  Patient is calling in to get approval for her husband to accompany her to her appointment wit Dr. Curt Bears on 01/19/19 at 11:00 am. Patient states that she has a hard time remembering things and needs her husband there with her to assist. Please call and confirm.

## 2019-01-19 ENCOUNTER — Other Ambulatory Visit: Payer: Self-pay

## 2019-01-19 ENCOUNTER — Encounter: Payer: Self-pay | Admitting: Cardiology

## 2019-01-19 ENCOUNTER — Telehealth: Payer: Self-pay | Admitting: Cardiology

## 2019-01-19 ENCOUNTER — Ambulatory Visit (INDEPENDENT_AMBULATORY_CARE_PROVIDER_SITE_OTHER): Payer: PPO | Admitting: Cardiology

## 2019-01-19 VITALS — BP 138/88 | HR 58 | Ht 62.0 in | Wt 268.6 lb

## 2019-01-19 DIAGNOSIS — I480 Paroxysmal atrial fibrillation: Secondary | ICD-10-CM

## 2019-01-19 MED ORDER — DRONEDARONE HCL 400 MG PO TABS: 400.0000 mg | ORAL_TABLET | Freq: Two times a day (BID) | ORAL | 0 refills | Status: DC

## 2019-01-19 MED ORDER — METOPROLOL TARTRATE 25 MG PO TABS: 25.0000 mg | ORAL_TABLET | Freq: Two times a day (BID) | ORAL | 3 refills | Status: DC

## 2019-01-19 NOTE — Progress Notes (Signed)
Electrophysiology Office Note   Date:  01/19/2019   ID:  Helen West, DOB 07-26-1955, MRN [id number]  PCP:  [name], MD  Cardiologist:  Debara Pickett Primary Electrophysiologist:  Kairi Tufo Meredith Leeds, MD    Chief Complaint: palpitations   History of Present Illness: Helen West is a 63 y.o. female who is being seen today for the evaluation of atrial fibrillation at the request of Hilty, Nadean Corwin, MD. Presenting today for electrophysiology evaluation.  She has a history of atrial fibrillation, hyperlipidemia, obesity.  She unfortunately continues to have episodes of atrial fibrillation despite flecainide.  Her main complaint are symptomatic palpitations.  She states that there is no exacerbating factor to her atrial fibrillation.  Over the past few months, her A. fib is gotten much worse, with quite a bit more symptoms.  She is quite fatigued after she goes back into normal rhythm.  Today, she denies symptoms of palpitations, chest pain, shortness of breath, orthopnea, PND, lower extremity edema, claudication, dizziness, presyncope, syncope, bleeding, or neurologic sequela. The patient is tolerating medications without difficulties.    Past Medical History:  Diagnosis Date  . A-fib ([place])   . Anemia   . Arthritis   . Cancer ([place])   . Complication of anesthesia    Loss of vocal/voice x 2 months  . Dysrhythmia   . Headache   . High cholesterol   . Obesity   . Post-menopausal bleeding    Past Surgical History:  Procedure Laterality Date  . ROBOTIC ASSISTED TOTAL HYSTERECTOMY WITH BILATERAL SALPINGO OOPHERECTOMY Bilateral 11/02/2017   Procedure: XI ROBOTIC ASSISTED TOTAL HYSTERECTOMY WITH BILATERAL SALPINGO OOPHORECTOMY;  Surgeon: [name], MD;  Location: [facility];  Service: Gynecology;  Laterality: Bilateral;  . SENTINEL NODE BIOPSY N/A 11/02/2017   Procedure: SENTINEL NODE BIOPSY;  Surgeon: [name], MD;  Location: [facility];  Service: Gynecology;  Laterality: N/A;  .  TONSILLECTOMY    . TOTAL HIP ARTHROPLASTY    . TUBAL LIGATION       Current Outpatient Medications  Medication Sig Dispense Refill  . atorvastatin (LIPITOR) 10 MG tablet Take 10 mg by mouth daily.    . flecainide (TAMBOCOR) 100 MG tablet Take 100 mg by mouth 2 (two) times daily.     . metoprolol tartrate (LOPRESSOR) 25 MG tablet Take 12.5 mg by mouth 2 (two) times daily.     . Misc Natural Products (TART CHERRY ADVANCED) CAPS Take 1 capsule by mouth.    . rivaroxaban (XARELTO) 20 MG TABS tablet Take 1 tablet (20 mg total) by mouth daily with supper.     No current facility-administered medications for this visit.    Allergies:   Patient has no known allergies.   Social History:  The patient  reports that she has never smoked. She has never used smokeless tobacco. She reports current alcohol use. She reports that she does not use drugs.   Family History:  The patient's family history includes Aneurysm in her mother; Cancer in her paternal uncle; Emphysema in her father.    ROS:  Please see the history of present illness.   Otherwise, review of systems is positive for none.   All other systems are reviewed and negative.    PHYSICAL EXAM: VS:  BP 138/88   Pulse (!) 58   Ht 5\' 2"  (1.575 m)   Wt 268 lb 9.6 oz (121.8 kg)   SpO2 98%   BMI 49.13 kg/m  , BMI Body mass index is  49.13 kg/m. GEN: Well nourished, well developed, in no acute distress  HEENT: normal  Neck: no JVD, carotid bruits, or masses Cardiac: RRR; no murmurs, rubs, or gallops,no edema  Respiratory:  clear to auscultation bilaterally, normal work of breathing GI: soft, nontender, nondistended, + BS MS: no deformity or atrophy  Skin: warm and dry Neuro:  Strength and sensation are intact Psych: euthymic mood, full affect  EKG:  EKG is ordered today. Personal review of the ekg ordered shows sinus rhythm, rate 60  Recent Labs: No results found for requested labs within last 8760 hours.    Lipid Panel  No  results found for: CHOL, TRIG, HDL, CHOLHDL, VLDL, LDLCALC, LDLDIRECT   Wt Readings from Last 3 Encounters:  01/19/19 268 lb 9.6 oz (121.8 kg)  12/06/18 260 lb 12.8 oz (118.3 kg)  11/03/18 264 lb 12.8 oz (120.1 kg)      Other studies Reviewed: Additional studies/ records that were reviewed today include: Monitor 01/17/19  Review of the above records today demonstrates:  Monitor shows PAF/flutter with possible aberrancy vs. NSVT (up to 12 beats).   ASSESSMENT AND PLAN:  1.  Paroxysmal atrial fibrillation: Currently on flecainide and Xarelto.  CHA2DS2-VASc of 2.  She is continued to have episodes of atrial fibrillation with an 8% burden on her cardiac monitor.  She did have what appears to be aberrancy and not ventricular tachycardia.  We William Schake plan to switch her from flecainide to Multaq and increase metoprolol to 25 mg twice a day.  I Hortence Charter see her back in 3 months.  2.  Hypertension: Increase metoprolol.  Blood pressure mildly elevated today.    Current medicines are reviewed at length with the patient today.   The patient does not have concerns regarding her medicines.  The following changes were made today:  none  Labs/ tests ordered today include:  No orders of the defined types were placed in this encounter.  Case discussed with referring cardiologist  Disposition:   FU with Lorali Khamis 3 months  Signed, Maxfield Gildersleeve Meredith Leeds, MD  01/19/2019 11:15 AM     [facility] [address] [place] [address] [phone] (office) [phone] (fax)

## 2019-01-19 NOTE — Patient Instructions (Addendum)
Medication Instructions:  Your physician has recommended you make the following change in your medication:  1. STOP Flecainide -- DO NOT STOP THIS UNTIL NURSE TELLS YOU TO 2. START Multaq 400 mg twice a day -- DO NOT START THIS MEDICATION UNTIL NURSE TELLS YOU TO 3. INCREASE Lopressor (Metoprolol) to 25 mg TWICE daily  * If you need a refill on your cardiac medications before your next appointment, please call your pharmacy.   Labwork: None ordered  Testing/Procedures: None ordered  Follow-Up: At [facility], you and your health needs are our priority.  As part of our continuing mission to provide you with exceptional heart care, we have created designated Provider Care Teams.  These Care Teams include your primary Cardiologist (physician) and Advanced Practice Providers (APPs -  Physician Assistants and Nurse Practitioners) who all work together to provide you with the care you need, when you need it.  You will need a follow up appointment in 3 months.  Please call our office 2 months in advance to schedule this appointment.  You may see Dr Curt Bears or one of the following Advanced Practice Providers on your designated Care Team:    Chanetta Marshall, NP  Tommye Standard, PA-C  Oda Kilts, [place]   Thank you for choosing [facility]!!   Trinidad Curet, RN [phone]  Any Other Special Instructions Will Be Listed Below (If Applicable).  Dronedarone tablets What is this medicine? DRONEDARONE (droe NE da rone) is an antiarrhythmic drug. It helps make your heart beat regularly. This medicine may be used for other purposes; ask your health care provider or pharmacist if you have questions. COMMON BRAND NAME(S): Multaq What should I tell my health care provider before I take this medicine? They need to know if you have any of these conditions:  heart failure  history of irregular heartbeat  liver disease  liver or lung problems with the past use of amiodarone  low levels of  magnesium in the blood  low levels of potassium in the blood  other heart disease  an unusual or allergic reaction to dronedarone, other medicines, foods, dyes, or preservatives  pregnant or trying to get pregnant  breast-feeding How should I use this medicine? Take this medicine by mouth with a glass of water. Follow the directions on the prescription label. Take one tablet with the morning meal and one tablet with the evening meal. Do not take your medicine more often than directed. Do not stop taking except on the advice of your doctor or health care professional. A special MedGuide will be given to you by the pharmacist with each prescription and refill. Be sure to read this information carefully each time. Talk to your pediatrician regarding the use of this medicine in children. Special care may be needed. Overdosage: If you think you have taken too much of this medicine contact a poison control center or emergency room at once. NOTE: This medicine is only for you. Do not share this medicine with others. What if I miss a dose? If you miss a dose, take it as soon as you can. If it is almost time for your next dose, take only that dose. Do not take double or extra doses. What may interact with this medicine? Do not take this medicine with any of the following medications:  arsenic trioxide  certain antibiotics like clarithromycin, erythromycin, pentamidine, telithromycin, troleandomycin  certain medicines for depression like tricyclic antidepressants  certain medicines for fungal infections like fluconazole, itraconazole, ketoconazole, posaconazole,  voriconazole  certain medicines for irregular heart beat like amiodarone, disopyramide, flecainide, ibutilide, quinidine, propafenone, sotalol  certain medicines for malaria like chloroquine, halofantrine  cisapride  cyclosporine  droperidol  haloperidol  methadone  other medicines that prolong the QT interval (cause an  abnormal heart rhythm)  pimozide  nefazodone  phenothiazines like chlorpromazine, mesoridazine, prochlorperazine, thioridazine  ritonavir  ziprasidone This medicine may also interact with the following medications:  certain medicines for blood pressure, heart disease, or irregular heart beat like diltiazem, metoprolol, propranolol, verapamil  certain medicines for cholesterol like atorvastatin, lovastatin, simvastatin  certain medicines for seizures like carbamazepine, phenobarbital, phenytoin  digoxin  dofetilide  grapefruit juice  rifampin  sirolimus  St. John's Wort  tacrolimus This list may not describe all possible interactions. Give your health care provider a list of all the medicines, herbs, non-prescription drugs, or dietary supplements you use. Also tell them if you smoke, drink alcohol, or use illegal drugs. Some items may interact with your medicine. What should I watch for while using this medicine? Your condition will be monitored closely when you first begin therapy. Often, this drug is first started in a hospital or other monitored health care setting. Once you are on maintenance therapy, visit your doctor or health care professional for regular checks on your progress. Because your condition and use of this medicine carry some risk, it is a good idea to carry an identification card, necklace or bracelet with details of your condition, medications, and doctor or health care professional. Dennis Bast may get drowsy or dizzy. Do not drive, use machinery, or do anything that needs mental alertness until you know how this medicine affects you. Do not stand or sit up quickly, especially if you are an older patient. This reduces the risk of dizzy or fainting spells. What side effects may I notice from receiving this medicine? Side effects that you should report to your doctor or health care professional as soon as possible:  allergic reactions like skin rash, itching or  hives, swelling of the face, lips, or tongue  breathing problems  cough  dark urine  fast, irregular heartbeat  general ill feeling or flu-like symptoms  light-colored stools  loss of appetite, nausea  right upper belly pain  slow heartbeat  stomach pain  swelling of the legs or ankles  unusually weak or tired  weight gain  yellowing of the eyes or skin Side effects that usually do not require medical attention (report to your doctor or health care professional if they continue or are bothersome):  nausea  vomiting  stomach pain This list may not describe all possible side effects. Call your doctor for medical advice about side effects. You may report side effects to FDA at 1-800-FDA-1088. Where should I keep my medicine? Keep out of the reach of children. Store at room temperature between 15 and 30 degrees C (59 and 86 degrees F). Throw away any unused medicine after the expiration date. NOTE: This sheet is a summary. It may not cover all possible information. If you have questions about this medicine, talk to your doctor, pharmacist, or health care provider.  2020 Elsevier/Gold Standard (2018-01-10 10:43:10)

## 2019-01-19 NOTE — Telephone Encounter (Signed)
Patient states the price of the medication is $162.06, multa400mg .

## 2019-01-23 MED ORDER — MULTAQ 400 MG PO TABS: 400.0000 mg | ORAL_TABLET | Freq: Two times a day (BID) | ORAL | 3 refills | Status: DC

## 2019-01-23 NOTE — Telephone Encounter (Signed)
lmtcb to discuss

## 2019-01-23 NOTE — Telephone Encounter (Signed)
Patient is returning phone call. She also states she is okay with the price of the medication.

## 2019-01-23 NOTE — Telephone Encounter (Signed)
Pt aware to stop Flecainide.  She reports her last dose was this morning and she understands not to take anymore going forward so that we can "wash this out of her system" in order to start Multaq. Pt will start Multaq christmas eve evening. Patient verbalized understanding and agreeable to plan.

## 2019-02-21 ENCOUNTER — Encounter: Payer: Self-pay | Admitting: Gynecologic Oncology

## 2019-02-21 ENCOUNTER — Inpatient Hospital Stay: Payer: PPO | Attending: Gynecologic Oncology | Admitting: Gynecologic Oncology

## 2019-02-21 ENCOUNTER — Other Ambulatory Visit: Payer: Self-pay

## 2019-02-21 VITALS — BP 146/54 | HR 53 | Ht 62.0 in | Wt 262.3 lb

## 2019-02-21 DIAGNOSIS — Z7901 Long term (current) use of anticoagulants: Secondary | ICD-10-CM | POA: Diagnosis not present

## 2019-02-21 DIAGNOSIS — Z923 Personal history of irradiation: Secondary | ICD-10-CM | POA: Insufficient documentation

## 2019-02-21 DIAGNOSIS — Z90722 Acquired absence of ovaries, bilateral: Secondary | ICD-10-CM | POA: Insufficient documentation

## 2019-02-21 DIAGNOSIS — I4891 Unspecified atrial fibrillation: Secondary | ICD-10-CM | POA: Insufficient documentation

## 2019-02-21 DIAGNOSIS — C541 Malignant neoplasm of endometrium: Secondary | ICD-10-CM | POA: Insufficient documentation

## 2019-02-21 DIAGNOSIS — R350 Frequency of micturition: Secondary | ICD-10-CM | POA: Diagnosis not present

## 2019-02-21 DIAGNOSIS — Z90710 Acquired absence of both cervix and uterus: Secondary | ICD-10-CM | POA: Diagnosis not present

## 2019-02-21 DIAGNOSIS — Z79899 Other long term (current) drug therapy: Secondary | ICD-10-CM | POA: Insufficient documentation

## 2019-02-21 NOTE — Progress Notes (Signed)
Follow-up Note: Gyn-Onc   Helen West 64 y.o. female  Chief Complaint  Patient presents with  . 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 S/p brachytherapy for high/intermediate risk factors completed 01/12/18.  No evidence of recurrence on examination  Plan:  I recommend she follow-up at 3 monthly intervals for symptom review, physical examination and pelvic examination. Pap smear is not recommended in routine endometrial cancer surveillance. After 2 years we will space these visits to every 6 months, and then annually if recurrence has not developed within 5 years. All questions were answered.  HPI: 64 year old white married female gravida 3 seen in consultation request of Dr. Paula Compton regarding management of complex atypical hyperplasia with papillary features.  The patient presented with postmenopausal bleeding and underwent an ultrasound showing an endometrial stripe of 21 mm.  An endometrial biopsy was obtained on October 13, 2017.  Obstetrical history gravida 3.  Patient had a postpartum tubal ligation following her second child.  She has had no other abdominal or pelvic surgery.  She has had a right total hip replacement.  She has atrial fibrillation and is on Xarelto and metoprolol.  On 11/02/17 she underwent robotic assisted total hysterectomy, BSO, SLN biopsy. Final pathology revealed a 5.5cm grade 1 endometrioid adenocarcinoma with 16 of 71mm myometrial invasion and LVSI present. Nodes,adnexa and cervix were negative.  Due to high/intermediate risk factors she was recommended to have adjuvant vaginal brachytherapy for local control.   Interval Hx:  She completed vaginal brachytherapy with with 5 fractions of 6Gy (30Gy total) between 12/16/17-01/12/18. She tolerated treatment well with no complaints or toxicity.  Since surgery she has noted some issues with urinary  frequency particularly at night.     Review of Systems:10 point review of systems is negative except as noted in interval history.   Vitals: Blood pressure (!) 146/54, pulse (!) 53, height 5\' 2"  (1.575 m), weight 262 lb 5 oz (119 kg), SpO2 98 %.  Physical Exam: General : The patient is a healthy woman in no acute distress.  HEENT: normocephalic, extraoccular movements normal; neck is supple without thyromegally  Lynphnodes: Supraclavicular and inguinal nodes not enlarged  Abdomen: Obese soft, non-tender, no ascites, no organomegally, no masses, no hernias  Pelvic:  Vaginal cuff normal smooth with no lesions, no bleeding, no masses Rectal: deferred Lower extremities: No edema or varicosities. Normal range of motion   No Known Allergies  Past Medical History:  Diagnosis Date  . A-fib ([place])   . Anemia   . Arthritis   . Cancer ([place])   . Complication of anesthesia    Loss of vocal/voice x 2 months  . Dysrhythmia   . Headache   . High cholesterol   . Obesity   . Post-menopausal bleeding     Past Surgical History:  Procedure Laterality Date  . ROBOTIC ASSISTED TOTAL HYSTERECTOMY WITH BILATERAL SALPINGO OOPHERECTOMY Bilateral 11/02/2017   Procedure: XI ROBOTIC ASSISTED TOTAL HYSTERECTOMY WITH BILATERAL SALPINGO OOPHORECTOMY;  Surgeon: [name], MD;  Location: [facility];  Service: Gynecology;  Laterality: Bilateral;  . SENTINEL NODE BIOPSY N/A 11/02/2017   Procedure: SENTINEL NODE BIOPSY;  Surgeon: [name], MD;  Location: [facility];  Service: Gynecology;  Laterality: N/A;  . TONSILLECTOMY    . TOTAL HIP ARTHROPLASTY    . TUBAL LIGATION      Current Outpatient Medications  Medication Sig Dispense Refill  .  atorvastatin (LIPITOR) 10 MG tablet Take 10 mg by mouth daily.    Marland Kitchen dronedarone (MULTAQ) 400 MG tablet Take 1 tablet (400 mg total) by mouth 2 (two) times daily with a meal. 60 tablet 3  . metoprolol tartrate (LOPRESSOR) 25 MG tablet Take 1 tablet (25 mg total) by mouth 2 (two)  times daily. 60 tablet 3  . Misc Natural Products (TART CHERRY ADVANCED) CAPS Take 1 capsule by mouth.    . rivaroxaban (XARELTO) 20 MG TABS tablet Take 1 tablet (20 mg total) by mouth daily with supper.     No current facility-administered medications for this visit.    Social History   Socioeconomic History  . Marital status: Married    Spouse name: Not on file  . Number of children: Not on file  . Years of education: Not on file  . Highest education level: Not on file  Occupational History  . Not on file  Tobacco Use  . Smoking status: Never Smoker  . Smokeless tobacco: Never Used  Substance and Sexual Activity  . Alcohol use: Yes    Comment: occasional social  . Drug use: Never  . Sexual activity: Not on file  Other Topics Concern  . Not on file  Social History Narrative  . Not on file   Social Determinants of Health   Financial Resource Strain:   . Difficulty of Paying Living Expenses: Not on file  Food Insecurity:   . Worried About Charity fundraiser in the Last Year: Not on file  . Ran Out of Food in the Last Year: Not on file  Transportation Needs:   . Lack of Transportation (Medical): Not on file  . Lack of Transportation (Non-Medical): Not on file  Physical Activity:   . Days of Exercise per Week: Not on file  . Minutes of Exercise per Session: Not on file  Stress:   . Feeling of Stress : Not on file  Social Connections:   . Frequency of Communication with Friends and Family: Not on file  . Frequency of Social Gatherings with Friends and Family: Not on file  . Attends Religious Services: Not on file  . Active Member of Clubs or Organizations: Not on file  . Attends Archivist Meetings: Not on file  . Marital Status: Not on file  Intimate Partner Violence:   . Fear of Current or Ex-Partner: Not on file  . Emotionally Abused: Not on file  . Physically Abused: Not on file  . Sexually Abused: Not on file    Family History  Problem Relation  Age of Onset  . Aneurysm Mother   . Emphysema Father   . Cancer Paternal Uncle     Thereasa Solo, MD 02/21/2019, 3:16 PM

## 2019-02-21 NOTE — Patient Instructions (Signed)
Please notify Dr Denman George at phone number [phone] if you notice vaginal bleeding, new pelvic or abdominal pains, bloating, feeling full easy, or a change in bladder or bowel function.   Please have Dr Clabe Seal office contact Dr Serita Grit office (at [phone]) in April to request an appointment with her for July, 2021.

## 2019-04-10 ENCOUNTER — Ambulatory Visit (INDEPENDENT_AMBULATORY_CARE_PROVIDER_SITE_OTHER): Payer: PPO | Admitting: Cardiology

## 2019-04-10 ENCOUNTER — Other Ambulatory Visit: Payer: Self-pay

## 2019-04-10 ENCOUNTER — Encounter: Payer: Self-pay | Admitting: Cardiology

## 2019-04-10 VITALS — BP 148/82 | HR 57 | Ht 62.0 in | Wt 262.6 lb

## 2019-04-10 DIAGNOSIS — I480 Paroxysmal atrial fibrillation: Secondary | ICD-10-CM | POA: Diagnosis not present

## 2019-04-10 NOTE — Progress Notes (Signed)
Electrophysiology Office Note   Date:  04/10/2019   ID:  Helen West, DOB [birth year], MRN [id number]  PCP:  [name], MD  Cardiologist:  Debara Pickett Primary Electrophysiologist:  Albino Bufford Meredith Leeds, MD    Chief Complaint: palpitations   History of Present Illness: Helen West is a 64 y.o. female who is being seen today for the evaluation of atrial fibrillation at the request of [name], [name], MD. Presenting today for electrophysiology evaluation.  She has a history of atrial fibrillation, hyperlipidemia, obesity.  She unfortunately continues to have episodes of atrial fibrillation despite flecainide.  Her main complaint are symptomatic palpitations.  She states that there is no exacerbating factor to her atrial fibrillation.  Over the past few months, her A. fib is gotten much worse, with quite a bit more symptoms.  She is quite fatigued after she goes back into normal rhythm.  Today, denies symptoms of palpitations, chest pain, shortness of breath, orthopnea, PND, lower extremity edema, claudication, dizziness, presyncope, syncope, bleeding, or neurologic sequela. The patient is tolerating medications without difficulties.  Overall she is doing well.  She has noted no further episodes of atrial fibrillation.  She does state that she has intermittent palpitations right when she takes her Multaq, but these are not too terribly bothersome.  She is tolerating medication without issue.   Past Medical History:  Diagnosis Date  . A-fib ([place])   . Anemia   . Arthritis   . Cancer ([place])   . Complication of anesthesia    Loss of vocal/voice x 2 months  . Dysrhythmia   . Headache   . High cholesterol   . Obesity   . Post-menopausal bleeding    Past Surgical History:  Procedure Laterality Date  . ROBOTIC ASSISTED TOTAL HYSTERECTOMY WITH BILATERAL SALPINGO OOPHERECTOMY Bilateral 11/02/2017   Procedure: XI ROBOTIC ASSISTED TOTAL HYSTERECTOMY WITH BILATERAL SALPINGO OOPHORECTOMY;  Surgeon:  [name], MD;  Location: [facility];  Service: Gynecology;  Laterality: Bilateral;  . SENTINEL NODE BIOPSY N/A 11/02/2017   Procedure: SENTINEL NODE BIOPSY;  Surgeon: [name], MD;  Location: [facility];  Service: Gynecology;  Laterality: N/A;  . TONSILLECTOMY    . TOTAL HIP ARTHROPLASTY    . TUBAL LIGATION       Current Outpatient Medications  Medication Sig Dispense Refill  . acetaminophen (TYLENOL) 650 MG CR tablet Take 1,300 mg by mouth daily.    Marland Kitchen atorvastatin (LIPITOR) 10 MG tablet Take 10 mg by mouth daily.    Marland Kitchen dronedarone (MULTAQ) 400 MG tablet Take 1 tablet (400 mg total) by mouth 2 (two) times daily with a meal. 60 tablet 3  . metoprolol tartrate (LOPRESSOR) 25 MG tablet Take 1 tablet (25 mg total) by mouth 2 (two) times daily. 60 tablet 3  . Misc Natural Products (TART CHERRY ADVANCED) CAPS Take 1 capsule by mouth.    . rivaroxaban (XARELTO) 20 MG TABS tablet Take 1 tablet (20 mg total) by mouth daily with supper.     No current facility-administered medications for this visit.    Allergies:   Patient has no known allergies.   Social History:  The patient  reports that she has never smoked. She has never used smokeless tobacco. She reports current alcohol use. She reports that she does not use drugs.   Family History:  The patient's family history includes Aneurysm in her mother; Cancer in her paternal uncle; Emphysema in her father.    ROS:  Please see the history of  present illness.   Otherwise, review of systems is positive for none.   All other systems are reviewed and negative.   PHYSICAL EXAM: VS:  BP (!) 148/82   Pulse (!) 57   Ht 5\' 2"  (1.575 m)   Wt 262 lb 9.6 oz (119.1 kg)   SpO2 97%   BMI 48.03 kg/m  , BMI Body mass index is 48.03 kg/m. GEN: Well nourished, well developed, in no acute distress  HEENT: normal  Neck: no JVD, carotid bruits, or masses Cardiac: RRR; no murmurs, rubs, or gallops,no edema  Respiratory:  clear to auscultation bilaterally,  normal work of breathing GI: soft, nontender, nondistended, + BS MS: no deformity or atrophy  Skin: warm and dry Neuro:  Strength and sensation are intact Psych: euthymic mood, full affect  EKG:  EKG is ordered today. Personal review of the ekg ordered shows sinus rhythm, rate 56   Recent Labs: No results found for requested labs within last 8760 hours.    Lipid Panel  No results found for: CHOL, TRIG, HDL, CHOLHDL, VLDL, LDLCALC, LDLDIRECT   Wt Readings from Last 3 Encounters:  04/10/19 262 lb 9.6 oz (119.1 kg)  02/21/19 262 lb 5 oz (119 kg)  01/19/19 268 lb 9.6 oz (121.8 kg)      Other studies Reviewed: Additional studies/ records that were reviewed today include: Monitor 01/17/19  Review of the above records today demonstrates:  Monitor shows PAF/flutter with possible aberrancy vs. NSVT (up to 12 beats).   ASSESSMENT AND PLAN:  1.  Paroxysmal atrial fibrillation: On Multaq and Xarelto.  Fortunately she has remained in sinus rhythm since starting Multaq stopping flecainide.  No changes at this time.  This patients CHA2DS2-VASc Score and unadjusted Ischemic Stroke Rate (% per year) is equal to 2.2 % stroke rate/year from a score of 2  Above score calculated as 1 point each if present [CHF, HTN, DM, Vascular=MI/PAD/Aortic Plaque, Age if 65-74, or Female] Above score calculated as 2 points each if present [Age > 75, or Stroke/TIA/TE]   2.  Hypertension: Currently elevated today.  She is on metoprolol.  She does state that her blood pressure is less than 130/80 at home.  She states that it is always high in the doctor's office.  No changes.    Current medicines are reviewed at length with the patient today.   The patient does not have concerns regarding her medicines.  The following changes were made today: None  Labs/ tests ordered today include:  Orders Placed This Encounter  Procedures  . EKG 12-Lead    Disposition:   FU with Song Garris 6  months  Signed, Jarmarcus Wambold Meredith Leeds, MD  04/10/2019 9:39 AM     [facility] [address] [place] [address] [phone] (office) [phone] (fax)

## 2019-05-01 ENCOUNTER — Telehealth: Payer: Self-pay | Admitting: Cardiology

## 2019-05-01 NOTE — Telephone Encounter (Signed)
BP normal today - would continue to monitor routinely, not just with symptoms and keep a log - can follow-up with [place] to determine if we need to adjust meds.  Dr Lemmie Evens

## 2019-05-01 NOTE — Telephone Encounter (Signed)
Pt c/o BP issue: STAT if pt c/o blurred vision, one-sided weakness or slurred speech  1. What are your last 5 BP readings?  04/28/19: 186/108 HR 85 and 153/101 HR 112 04/29/19: 135/94 HR 108 05/01/19: 121/88 HR 77  2. Are you having any other symptoms (ex. Dizziness, headache, blurred vision, passed out)?  Headache, denies all other symptoms  3. What is your BP issue? BP and HR continue to rise. States she does not realize her BP is high until she gets a headache.

## 2019-05-01 NOTE — Telephone Encounter (Signed)
Returned call to patient and reviewed Dr. Lysbeth Penner advice. She reports she is checking her BP and HR soon after waking up and later at night. I advised her that we would like her to monitor BP about 1-2 hours after she takes her medication so that we can determine effectiveness of her medications. She states HR has been high and I discussed normal variations in HR and to make certain she is staying well hydrated and to check BP and HR after sitting for 5 minutes at rest. I advised her to record a log for one week with these directions and to call back to let us know what kind of readings she is getting. She verbalized understanding and agreement with plan. She states Sherri, RN advised she may be able to leave samples of Xarelto or Multaq for her at the [place] office. I advised that I will forward the message. Patient thanked me for the call.

## 2019-05-01 NOTE — Telephone Encounter (Signed)
Routing to EP, Dr. Curt Bears, and primary cardiologist, Dr. Debara Pickett.

## 2019-05-02 NOTE — Telephone Encounter (Signed)
Pt aware I would bring samples with me to [facility] on Monday, 4/5. She appreciates the help and thanks me.

## 2019-05-04 ENCOUNTER — Encounter: Payer: Self-pay | Admitting: Radiation Oncology

## 2019-05-04 ENCOUNTER — Other Ambulatory Visit: Payer: Self-pay

## 2019-05-04 ENCOUNTER — Ambulatory Visit
Admission: RE | Admit: 2019-05-04 | Discharge: 2019-05-04 | Disposition: A | Discharge status: Home / Self Care | Payer: PPO | Source: Ambulatory Visit | Attending: Radiation Oncology | Admitting: Radiation Oncology

## 2019-05-04 VITALS — BP 138/91 | HR 68 | Temp 98.2°F | Resp 20 | Ht 62.0 in | Wt 264.0 lb

## 2019-05-04 DIAGNOSIS — Z8542 Personal history of malignant neoplasm of other parts of uterus: Secondary | ICD-10-CM | POA: Insufficient documentation

## 2019-05-04 DIAGNOSIS — Z79899 Other long term (current) drug therapy: Secondary | ICD-10-CM | POA: Diagnosis not present

## 2019-05-04 DIAGNOSIS — Z923 Personal history of irradiation: Secondary | ICD-10-CM | POA: Insufficient documentation

## 2019-05-04 DIAGNOSIS — Z7901 Long term (current) use of anticoagulants: Secondary | ICD-10-CM | POA: Insufficient documentation

## 2019-05-04 DIAGNOSIS — C541 Malignant neoplasm of endometrium: Secondary | ICD-10-CM

## 2019-05-04 DIAGNOSIS — Z08 Encounter for follow-up examination after completed treatment for malignant neoplasm: Secondary | ICD-10-CM | POA: Diagnosis not present

## 2019-05-04 NOTE — Progress Notes (Signed)
  Radiation Oncology         [phone] ________________________________  Name: Helen West MRN: [id number]  Date: 05/04/2019  DOB: 1955/04/17  Follow-Up Visit Note  CC: [name], MD  [name], MD    ICD-10-CM   1. Endometrial cancer ([place])  C54.1     Diagnosis:   Stage IB (pT1b, pN0) Invasive Endometrioid Adenocarcinoma, Grade 1   Interval Since Last Radiation:  1 year, 4 months  11/14, 11/21, 11/27, 12/4, 01/12/18:  Vaginal cuff, 6 Gy in 5 fractions for a total dose of 30 Gy  Narrative:  The patient returns today for routine follow-up. She last saw Dr. Denman George in January, with no evidence of recurrence on exam at that time.  On review of systems, she Is without complaints today. She denies pain, dysuria or hematuria, vaginal discharge or bleeding, rectal bleeding, diarrhea or constipation, abdominal bloating, and nausea or vomiting.          ALLERGIES:  has No Known Allergies.  Meds: Current Outpatient Medications  Medication Sig Dispense Refill  . acetaminophen (TYLENOL) 650 MG CR tablet Take 1,300 mg by mouth daily.    Marland Kitchen atorvastatin (LIPITOR) 10 MG tablet Take 10 mg by mouth daily.    Marland Kitchen dronedarone (MULTAQ) 400 MG tablet Take 1 tablet (400 mg total) by mouth 2 (two) times daily with a meal. 60 tablet 3  . metoprolol tartrate (LOPRESSOR) 25 MG tablet Take 1 tablet (25 mg total) by mouth 2 (two) times daily. 60 tablet 3  . Misc Natural Products (TART CHERRY ADVANCED) CAPS Take 1 capsule by mouth.    . rivaroxaban (XARELTO) 20 MG TABS tablet Take 1 tablet (20 mg total) by mouth daily with supper.     No current facility-administered medications for this encounter.    Physical Findings: The patient is in no acute distress. Patient is alert and oriented.  height is 5\' 2"  (1.575 m) and weight is 264 lb (119.7 kg). Her temperature is 98.2 F (36.8 C). Her blood pressure is 138/91 (abnormal) and her pulse is 68. Her respiration is 20 and oxygen saturation is 100%. .  No  significant changes. Lungs are clear to auscultation bilaterally. Heart has regular rate and rhythm. No palpable cervical, supraclavicular, or axillary adenopathy. Abdomen soft, non-tender, normal bowel sounds. On pelvic examination the external genitalia are unremarkable.  A speculum exam was performed.  No adhesions noted.  No mucosal lesions noted in the vaginal vault.  The vaginal cuff is intact.  Bimanual examination no pelvic masses appreciated.  Lab Findings: Lab Results  Component Value Date   WBC 8.6 11/03/2017   HGB 12.5 11/03/2017   HCT 39.0 11/03/2017   MCV 96.5 11/03/2017   PLT 285 11/03/2017    Radiographic Findings: No results found.  Impression:  Stage IB (pT1b, pN0) Invasive Endometrioid Adenocarcinoma, Grade 1.    No evidence of recurrence on clinical exam today.  Plan:  Follow-up in radiation oncology in 6 months. Follow-up with Dr. Denman George in 3 months.  ____________________________________   Blair Promise, PhD, MD  This document serves as a record of services personally performed by Gery Pray, MD. It was created on his behalf by Wilburn Mylar, a trained medical scribe. The creation of this record is based on the scribe's personal observations and the provider's statements to them. This document has been checked and approved by the attending provider.

## 2019-05-04 NOTE — Patient Instructions (Signed)
Coronavirus (COVID-19) Are you at risk?  Are you at risk for the Coronavirus (COVID-19)?  To be considered HIGH RISK for Coronavirus (COVID-19), you have to meet the following criteria:  . Traveled to [place], [place], [place], [place] or [place]; or in the [place] to [place], [place], [place], or [place]; and have fever, cough, and shortness of breath within the last 2 weeks of travel OR . Been in close contact with a person diagnosed with COVID-19 within the last 2 weeks and have fever, cough, and shortness of breath . IF YOU DO NOT MEET THESE CRITERIA, YOU ARE CONSIDERED LOW RISK FOR COVID-19.  What to do if you are HIGH RISK for COVID-19?  . If you are having a medical emergency, call 911. . Seek medical care right away. Before you go to a doctor's office, urgent care or emergency department, call ahead and tell them about your recent travel, contact with someone diagnosed with COVID-19, and your symptoms. You should receive instructions from your physician's office regarding next steps of care.  . When you arrive at healthcare provider, tell the healthcare staff immediately you have returned from visiting [place], [place], [place], [place] or [place]; or traveled in the [place] to [place], [place], [place], or [place]; in the last two weeks or you have been in close contact with a person diagnosed with COVID-19 in the last 2 weeks.   . Tell the health care staff about your symptoms: fever, cough and shortness of breath. . After you have been seen by a medical provider, you will be either: o Tested for (COVID-19) and discharged home on quarantine except to seek medical care if symptoms worsen, and asked to  - Stay home and avoid contact with others until you get your results (4-5 days)  - Avoid travel on public transportation if possible (such as bus, train, or airplane) or o Sent to the Emergency Department by EMS for evaluation, COVID-19 testing, and possible  admission depending on your condition and test results.  What to do if you are LOW RISK for COVID-19?  Reduce your risk of any infection by using the same precautions used for avoiding the common cold or flu:  . Wash your hands often with soap and warm water for at least 20 seconds.  If soap and water are not readily available, use an alcohol-based hand sanitizer with at least 60% alcohol.  . If coughing or sneezing, cover your mouth and nose by coughing or sneezing into the elbow areas of your shirt or coat, into a tissue or into your sleeve (not your hands). . Avoid shaking hands with others and consider head nods or verbal greetings only. . Avoid touching your eyes, nose, or mouth with unwashed hands.  . Avoid close contact with people who are sick. . Avoid places or events with large numbers of people in one location, like concerts or sporting events. . Carefully consider travel plans you have or are making. . If you are planning any travel outside or inside the [place], visit the CDC's Travelers' Health webpage for the latest health notices. . If you have some symptoms but not all symptoms, continue to monitor at home and seek medical attention if your symptoms worsen. . If you are having a medical emergency, call 911.   ADDITIONAL HEALTHCARE OPTIONS FOR PATIENTS  [facility] / e-Visit: [email or web address]         [facility]: [phone]  [facility]: [phone]                   [facility]: [phone]   

## 2019-05-04 NOTE — Progress Notes (Signed)
Pt presents today for f/u with Dr. Sondra Come. Pt denies c/o pain. Pt denies dysuria/hematuria. Pt denies vaginal bleeding/discharge. Pt denies rectal bleeding, diarrhea/constipation. Pt denies abdominal bloating, N/V.  BP (!) 138/91 (BP Location: Left Wrist, Patient Position: Sitting, Cuff Size: Small)   Pulse 68   Temp 98.2 F (36.8 C)   Resp 20   Ht 5\' 2"  (1.575 m)   Wt 264 lb (119.7 kg)   SpO2 100%   BMI 48.29 kg/m  Wt Readings from Last 3 Encounters:  05/04/19 264 lb (119.7 kg)  04/10/19 262 lb 9.6 oz (119.1 kg)  02/21/19 262 lb 5 oz (119 kg)   Loma Sousa, RN BSN

## 2019-05-08 NOTE — Telephone Encounter (Signed)
Left detailed message informing pt that medications are in [place] office and available to pick up at her convenience.

## 2019-05-29 ENCOUNTER — Other Ambulatory Visit: Payer: Self-pay | Admitting: Cardiology

## 2019-06-22 ENCOUNTER — Telehealth: Payer: Self-pay | Admitting: *Deleted

## 2019-06-22 NOTE — Telephone Encounter (Signed)
XXXXX

## 2019-06-22 NOTE — Telephone Encounter (Signed)
CALLED PATIENT TO INFORM OF FU APPT. WITH DR. Denman George ON 08-16-19 - ARRIVAL TIME- 12:45 PM, LVM FOR A RETURN CALL

## 2019-08-03 ENCOUNTER — Other Ambulatory Visit: Payer: Self-pay | Admitting: Cardiology

## 2019-08-04 ENCOUNTER — Other Ambulatory Visit: Payer: Self-pay | Admitting: Cardiology

## 2019-08-04 MED ORDER — MULTAQ 400 MG PO TABS: 400.0000 mg | ORAL_TABLET | Freq: Two times a day (BID) | ORAL | 8 refills | Status: DC

## 2019-08-04 MED ORDER — MULTAQ 400 MG PO TABS: 400.0000 mg | ORAL_TABLET | Freq: Two times a day (BID) | ORAL | 3 refills | Status: DC

## 2019-08-04 NOTE — Telephone Encounter (Signed)
°*  STAT* If patient is at the pharmacy, call can be transferred to refill team.   1. Which medications need to be refilled? (please list name of each medication and dose if known) dronedarone (MULTAQ) 400 MG tablet  2. Which pharmacy/location (including street and city if local pharmacy) is medication to be sent to? [place], [place]  3. Do they need a 30 day or 90 day supply? [place]

## 2019-08-04 NOTE — Telephone Encounter (Signed)
Refill sent in per request.  

## 2019-08-16 ENCOUNTER — Other Ambulatory Visit: Payer: Self-pay

## 2019-08-16 ENCOUNTER — Encounter: Payer: Self-pay | Admitting: Gynecologic Oncology

## 2019-08-16 ENCOUNTER — Inpatient Hospital Stay: Payer: PPO | Attending: Gynecologic Oncology | Admitting: Gynecologic Oncology

## 2019-08-16 VITALS — BP 142/77 | HR 69 | Temp 98.7°F | Resp 17 | Ht 62.0 in | Wt 268.6 lb

## 2019-08-16 DIAGNOSIS — Z90710 Acquired absence of both cervix and uterus: Secondary | ICD-10-CM | POA: Diagnosis not present

## 2019-08-16 DIAGNOSIS — C541 Malignant neoplasm of endometrium: Secondary | ICD-10-CM | POA: Diagnosis not present

## 2019-08-16 DIAGNOSIS — M1990 Unspecified osteoarthritis, unspecified site: Secondary | ICD-10-CM | POA: Insufficient documentation

## 2019-08-16 DIAGNOSIS — E7800 Pure hypercholesterolemia, unspecified: Secondary | ICD-10-CM | POA: Insufficient documentation

## 2019-08-16 DIAGNOSIS — Z79899 Other long term (current) drug therapy: Secondary | ICD-10-CM | POA: Diagnosis not present

## 2019-08-16 DIAGNOSIS — E669 Obesity, unspecified: Secondary | ICD-10-CM | POA: Insufficient documentation

## 2019-08-16 DIAGNOSIS — R350 Frequency of micturition: Secondary | ICD-10-CM | POA: Diagnosis not present

## 2019-08-16 DIAGNOSIS — I4891 Unspecified atrial fibrillation: Secondary | ICD-10-CM | POA: Diagnosis not present

## 2019-08-16 DIAGNOSIS — Z923 Personal history of irradiation: Secondary | ICD-10-CM | POA: Diagnosis not present

## 2019-08-16 DIAGNOSIS — Z7901 Long term (current) use of anticoagulants: Secondary | ICD-10-CM | POA: Insufficient documentation

## 2019-08-16 DIAGNOSIS — Z90722 Acquired absence of ovaries, bilateral: Secondary | ICD-10-CM | POA: Diagnosis not present

## 2019-08-16 NOTE — Progress Notes (Signed)
Follow-up Note: Gyn-Onc ° ° °Helen West 64 y.o. female ° °Chief Complaint  °Patient presents with  °• Endometrial cancer ([facility])  ° ° °Assessment : stage IB grade 1 endometrioid endometrial cancer (MMR normal, MSI stable), s/p staging surgery on 11/02/17. S/p brachytherapy for high/intermediate risk factors completed 01/12/18. ° °No evidence of recurrence on examination ° °Plan: ° °I recommend she follow-up at 3 monthly intervals for symptom review, physical examination and pelvic examination. Pap smear is not recommended in routine endometrial cancer surveillance. After December, 2021 we will space these visits to every 6 months, and then annually if recurrence has not developed within 5 years. °All questions were answered. ° °HPI: 62-year-old white married female gravida 3 seen in consultation request of Dr. Kathy Richardson regarding management of complex atypical hyperplasia with papillary features.  The patient presented with postmenopausal bleeding and underwent an ultrasound showing an endometrial stripe of 21 mm.  An endometrial biopsy was obtained on October 13, 2017. ° °Obstetrical history gravida 3.  Patient had a postpartum tubal ligation following her second child.  She has had no other abdominal or pelvic surgery.  She has atrial fibrillation and is on Xarelto and metoprolol. ° °On 11/02/17 she underwent robotic assisted total hysterectomy, BSO, SLN biopsy. °Final pathology revealed a 5.5cm grade 1 endometrioid adenocarcinoma with 16 of 19mm myometrial invasion and LVSI present. °Nodes,adnexa and cervix were negative. °MMR normal/MSI stable. ° °Due to high/intermediate risk factors she was recommended to have adjuvant vaginal brachytherapy for local control.  °She completed vaginal brachytherapy with with 5 fractions of 6Gy (30Gy total) between 12/16/17-01/12/18. She tolerated treatment well with no complaints or toxicity. ° °Interval Hx:  °Since surgery she has noted some issues with urinary  frequency particularly at night.    °She is sexually active with no concerns.  ° °Review of Systems:10 point review of systems is negative except as noted in interval history.  ° °Vitals: Blood pressure (!) 142/77, pulse 69, temperature 98.7 °F (37.1 °C), temperature source Temporal, resp. rate 17, height 5' 2" (1.575 m), weight 268 lb 9.6 oz (121.8 kg), SpO2 99 %. ° °Physical Exam: °General : The patient is a healthy woman in no acute distress.  °HEENT: normocephalic, extraoccular movements normal; neck is supple without thyromegally  °Lynphnodes: Supraclavicular and inguinal nodes not enlarged  °Abdomen: Obese soft, non-tender, no ascites, no organomegally, no masses, no hernias  °Pelvic:  °Vaginal cuff normal smooth with no lesions, no bleeding, no masses °Rectal: deferred °Lower extremities: No edema or varicosities. Normal range of motion  ° °No Known Allergies ° °Past Medical History:  °Diagnosis Date  °• A-fib ([facility])   °• Anemia   °• Arthritis   °• Cancer ([facility])   °• Complication of anesthesia   ° Loss of vocal/voice x 2 months  °• Dysrhythmia   °• Headache   °• High cholesterol   °• Obesity   °• Post-menopausal bleeding   ° ° °Past Surgical History:  °Procedure Laterality Date  °• ROBOTIC ASSISTED TOTAL HYSTERECTOMY WITH BILATERAL SALPINGO OOPHERECTOMY Bilateral 11/02/2017  ° Procedure: XI ROBOTIC ASSISTED TOTAL HYSTERECTOMY WITH BILATERAL SALPINGO OOPHORECTOMY;  Surgeon: Rossi, Emma, MD;  Location: [facility];  Service: Gynecology;  Laterality: Bilateral;  °• SENTINEL NODE BIOPSY N/A 11/02/2017  ° Procedure: SENTINEL NODE BIOPSY;  Surgeon: Rossi, Emma, MD;  Location: [facility];  Service: Gynecology;  Laterality: N/A;  °• TONSILLECTOMY    °• TOTAL HIP ARTHROPLASTY    °• TUBAL LIGATION    ° ° °  Current Outpatient Medications  °Medication Sig Dispense Refill  °• acetaminophen (TYLENOL) 650 MG CR tablet Take 1,300 mg by mouth daily.    °• atorvastatin (LIPITOR) 10 MG tablet Take 10 mg by mouth daily.    °• diltiazem  (CARDIZEM CD) 180 MG 24 hr capsule Take 180 mg by mouth daily.    °• dronedarone (MULTAQ) 400 MG tablet Take 1 tablet (400 mg total) by mouth 2 (two) times daily with a meal. 180 tablet 3  °• metoprolol tartrate (LOPRESSOR) 25 MG tablet TAKE ONE TABLET TWICE DAILY 60 tablet 5  °• Misc Natural Products (TART CHERRY ADVANCED) CAPS Take 1 capsule by mouth.    °• rivaroxaban (XARELTO) 20 MG TABS tablet Take 1 tablet (20 mg total) by mouth daily with supper.    ° °No current facility-administered medications for this visit.  ° ° °Social History  ° °Socioeconomic History  °• Marital status: Married  °  Spouse name: Not on file  °• Number of children: Not on file  °• Years of education: Not on file  °• Highest education level: Not on file  °Occupational History  °• Not on file  °Tobacco Use  °• Smoking status: Never Smoker  °• Smokeless tobacco: Never Used  °Vaping Use  °• Vaping Use: Never used  °Substance and Sexual Activity  °• Alcohol use: Yes  °  Comment: occasional social  °• Drug use: Never  °• Sexual activity: Not on file  °Other Topics Concern  °• Not on file  °Social History Narrative  °• Not on file  ° °Social Determinants of Health  ° °Financial Resource Strain:   °• Difficulty of Paying Living Expenses:   °Food Insecurity:   °• Worried About Running Out of Food in the Last Year:   °• Ran Out of Food in the Last Year:   °Transportation Needs:   °• Lack of Transportation (Medical):   °• Lack of Transportation (Non-Medical):   °Physical Activity:   °• Days of Exercise per Week:   °• Minutes of Exercise per Session:   °Stress:   °• Feeling of Stress :   °Social Connections:   °• Frequency of Communication with Friends and Family:   °• Frequency of Social Gatherings with Friends and Family:   °• Attends Religious Services:   °• Active Member of Clubs or Organizations:   °• Attends Club or Organization Meetings:   °• Marital Status:   °Intimate Partner Violence:   °• Fear of Current or Ex-Partner:   °• Emotionally  Abused:   °• Physically Abused:   °• Sexually Abused:   ° ° °Family History  °Problem Relation Age of Onset  °• Aneurysm Mother   °• Emphysema Father   °• Cancer Paternal Uncle   ° ° °Emma C Rossi, MD °08/16/2019, 1:56 PM ° ° ° ° ° ° °

## 2019-08-16 NOTE — Patient Instructions (Signed)
Please notify Dr Denman George at phone number [phone] if you notice vaginal bleeding, new pelvic or abdominal pains, bloating, feeling full easy, or a change in bladder or bowel function.   Please see Dr Sondra Come in 3 months. Please have his office contact Dr Serita Grit office (at [phone]) after that visit to request an appointment with her for January, 2022.

## 2019-08-29 ENCOUNTER — Other Ambulatory Visit: Payer: Self-pay

## 2019-09-06 ENCOUNTER — Telehealth: Payer: Self-pay | Admitting: Family Medicine

## 2019-09-06 NOTE — Progress Notes (Signed)
  Chronic Care Management   Note  09/06/2019 Name: SHARYL PANCHAL MRN: [id number] DOB: 29-Aug-1955  LECRETIA BUCZEK is a 64 y.o. year old female who is a primary care patient of [name], Kirsten, MD. I reached out to [name] by phone today in response to a referral sent by Ms. Jerrye Beavers Bowell's PCP, [name], Kirsten, MD.   Ms. Gallicchio was given information about Chronic Care Management services today including:  1. CCM service includes personalized support from designated clinical staff supervised by her physician, including individualized plan of care and coordination with other care providers 2. 24/7 contact phone numbers for assistance for urgent and routine care needs. 3. Service will only be billed when office clinical staff spend 20 minutes or more in a month to coordinate care. 4. Only one practitioner may furnish and bill the service in a calendar month. 5. The patient may stop CCM services at any time (effective at the end of the month) by phone call to the office staff.   Patient agreed to services and verbal consent obtained.   Follow up plan:   Earney [place] Upstream Scheduler

## 2019-09-12 ENCOUNTER — Ambulatory Visit: Payer: PPO

## 2019-09-12 NOTE — Chronic Care Management (AMB) (Deleted)
Chronic Care Management Pharmacy  Name: RADIAH LUBINSKI  MRN: [id number] DOB: 04-05-1955  Chief Complaint/ HPI  Helen West,  64 y.o. , female presents for their Initial CCM visit with the clinical pharmacist {CHL HP Upstream Pharm visit CNOB:[id number]}.  PCP : [name], MD  Their chronic conditions include: atrial fibrillation, arthritis, high cholesterol, anemia, hypertension.  Office Visits:***  Consult Visit:*** 08/16/2019 - Gynecology Oncology - recommend she follow-up at 3 monthly intervals for symptom review, physical examination and pelvic examination 05/04/2019 - Oncology follow-up 04/10/2019 - Cardio - no changes to medication. Normal sinus rhythm.  Medications: Outpatient Encounter Medications as of 09/14/2019  Medication Sig  . acetaminophen (TYLENOL) 650 MG CR tablet Take 1,300 mg by mouth daily.  Marland Kitchen atorvastatin (LIPITOR) 10 MG tablet Take 10 mg by mouth daily.  Marland Kitchen diltiazem (CARDIZEM CD) 180 MG 24 hr capsule Take 180 mg by mouth daily.  Marland Kitchen dronedarone (MULTAQ) 400 MG tablet Take 1 tablet (400 mg total) by mouth 2 (two) times daily with a meal.  . metoprolol tartrate (LOPRESSOR) 25 MG tablet TAKE ONE TABLET TWICE DAILY  . Misc Natural Products (TART CHERRY ADVANCED) CAPS Take 1 capsule by mouth.  . rivaroxaban (XARELTO) 20 MG TABS tablet Take 1 tablet (20 mg total) by mouth daily with supper.   No facility-administered encounter medications on file as of 09/14/2019.     Current Diagnosis/Assessment:  Goals Addressed   None     AFIB   Patient is currently rhythm controlled. HR *** BPM  CHA2DS2-VASc Score = 2  The patient's score is based upon: CHF History: 0 HTN History: 0 Age : 1 Diabetes History: 0 Stroke History: 0 Vascular Disease History: 0 Gender: 1  { Patient has failed these meds in past: flecanide Patient is currently {CHL Controlled/[phone]} on the following medications:   dronedarone 400 mg bid with a  meal  Metoprolol tartrate 25 mg bid  Xarelto 20 mg daily with supper  Diltiazem 180 mg daily  We discussed:  {CHL HP Upstream Pharmacy discussion:[id number]}  Plan  Continue {CHL HP Upstream Pharmacy [phone]}   Hypertension   BP today is:  {CHL HP UPSTREAM Pharmacist BP ranges:[phone]}  Office blood pressures are  BP Readings from Last 3 Encounters:  08/16/19 (!) 142/77  05/04/19 (!) 138/91  04/10/19 (!) 148/82    Patient has failed these meds in the past: *** Patient is currently controlled/uncontrolled*** on the following medications: ***  Metoprolol 25 mg bid Patient checks BP at home {CHL HP BP Monitoring Frequency:[phone]}  Patient home BP readings are ranging: ***  We discussed {CHL HP Upstream Pharmacy discussion:[id number]}  Plan  Continue {CHL HP Upstream Pharmacy [phone]}      Hyperlipidemia   LDL goal < ***  Lipid Panel  No results found for: CHOL, TRIG, HDL, LDLCALC, LDLDIRECT  Hepatic Function Latest Ref Rng & Units 11/01/2017  Total Protein 6.5 - 8.1 g/dL 7.1  Albumin 3.5 - 5.0 g/dL 4.0  AST 15 - 41 U/L 17  ALT 0 - 44 U/L 14  Alk Phosphatase 38 - 126 U/L 75  Total Bilirubin 0.3 - 1.2 mg/dL 0.7     The ASCVD Risk score Mikey Bussing DC Jr., et al., 2013) failed to calculate for the following reasons:   Cannot find a previous HDL lab   Cannot find a previous total cholesterol lab   Patient has failed these meds in past: *** Patient is currently {CHL Controlled/[phone]} on the following medications:  . Atorvastatin  10 mg daily  We discussed:  {CHL HP Upstream Pharmacy discussion:[phone]}  Plan  Continue {CHL HP Upstream Pharmacy Plans:[phone]}   Arthritis   Patient has failed these meds in past: *** Patient is currently {CHL Controlled/Uncontrolled:[phone]} on the following medications:  . Tylenol 1300 mg CR daily . Tart Cherry ***  We discussed:  ***  Plan  Continue {CHL HP Upstream  Pharmacy Plans:[phone]}   Osteopenia / Osteoporosis   Last DEXA Scan: ***   T-Score femoral neck: ***  T-Score total hip: ***  T-Score lumbar spine: ***  T-Score forearm radius: ***  10-year probability of major osteoporotic fracture: ***  10-year probability of hip fracture: ***  No results found for: VD25OH   Patient {is;is not an osteoporosis candidate:23886}  Patient has failed these meds in past: alendronate Patient is currently {CHL Controlled/Uncontrolled:[phone]} on the following medications:  . ***  We discussed:  {Osteoporosis Counseling:23892}  Plan  Continue {CHL HP Upstream Pharmacy Plans:[phone]}  Health Maintenance   Patient is currently {CHL Controlled/Uncontrolled:[phone]} on the following medications:  . ***  We discussed:  ***  Plan  Continue {CHL HP Upstream Pharmacy ERDEY:[id number]}  Vaccines   Reviewed and discussed patient's vaccination history.     There is no immunization history on file for this patient.  Plan  Recommended patient receive *** vaccine in *** office.   Medication Management   Pt uses [facility] for all medications Uses pill box? {Yes or If no, why not?:20788} Pt endorses ***% compliance  We discussed: ***  Plan  {US Pharmacy JSHF:02637}    Follow up: *** month phone visit  ***

## 2019-09-13 ENCOUNTER — Telehealth: Payer: Self-pay

## 2019-09-13 NOTE — Progress Notes (Signed)
Reached out to patient ahead of pharmacy call for tomorrow. Left message for her to return call.  Helen West, [place] Pharmacist Assistant  [phone]

## 2019-09-14 ENCOUNTER — Ambulatory Visit: Payer: PPO

## 2019-09-20 ENCOUNTER — Telehealth: Payer: Self-pay

## 2019-09-20 NOTE — Progress Notes (Signed)
Reached out to patient ahead of pharmacy call tomorrow. Left a message for her to return call.   Martinique Uselman, [place] Pharmacist Assistant  [phone]

## 2019-09-21 ENCOUNTER — Ambulatory Visit: Payer: PPO

## 2019-09-21 NOTE — Chronic Care Management (AMB) (Unsigned)
Chronic Care Management Pharmacy  Name: Helen West  MRN: [id number] DOB: 03/27/1955  Chief Complaint/ HPI  Helen West,  64 y.o. , female presents for their Initial CCM visit with the clinical pharmacist {CHL HP Upstream Pharm visit GXQJ:[id number]}.  PCP : [name], MD  Their chronic conditions include: atrial fibrillation, arthritis, high cholesterol, anemia, hypertension.  Office Visits:***  Consult Visit:*** 08/16/2019 - Gynecology Oncology - recommend she follow-up at 3 monthly intervals for symptom review, physical examination and pelvic examination 05/04/2019 - Oncology follow-up 04/10/2019 - Cardio - no changes to medication. Normal sinus rhythm.  Medications: Outpatient Encounter Medications as of 09/21/2019  Medication Sig  . acetaminophen (TYLENOL) 650 MG CR tablet Take 1,300 mg by mouth daily.  Marland Kitchen atorvastatin (LIPITOR) 10 MG tablet Take 10 mg by mouth daily.  Marland Kitchen diltiazem (CARDIZEM CD) 180 MG 24 hr capsule Take 180 mg by mouth daily.  Marland Kitchen dronedarone (MULTAQ) 400 MG tablet Take 1 tablet (400 mg total) by mouth 2 (two) times daily with a meal.  . metoprolol tartrate (LOPRESSOR) 25 MG tablet TAKE ONE TABLET TWICE DAILY  . Misc Natural Products (TART CHERRY ADVANCED) CAPS Take 1 capsule by mouth.  . rivaroxaban (XARELTO) 20 MG TABS tablet Take 1 tablet (20 mg total) by mouth daily with supper.   No facility-administered encounter medications on file as of 09/21/2019.     Current Diagnosis/Assessment:  Goals Addressed   None     AFIB   Patient is currently rhythm controlled. HR *** BPM  CHA2DS2-VASc Score = 2  The patient's score is based upon: CHF History: 0 HTN History: 0 Age : 1 Diabetes History: 0 Stroke History: 0 Vascular Disease History: 0 Gender: 1  { Patient has failed these meds in past: flecanide Patient is currently {CHL Controlled/Uncontrolled:[phone]} on the following medications:   dronedarone 400 mg bid with a  meal  Metoprolol tartrate 25 mg bid  Xarelto 20 mg daily with supper  Diltiazem 180 mg daily  We discussed:  {CHL HP Upstream Pharmacy discussion:[phone]}  Plan  Continue {CHL HP Upstream Pharmacy Plans:[phone]}   Hypertension   BP today is:  {CHL HP UPSTREAM Pharmacist BP ranges:[phone]}  Office blood pressures are  BP Readings from Last 3 Encounters:  08/16/19 (!) 142/77  05/04/19 (!) 138/91  04/10/19 (!) 148/82    Patient has failed these meds in the past: *** Patient is currently controlled/uncontrolled*** on the following medications: ***  Metoprolol 25 mg bid Patient checks BP at home {CHL HP BP Monitoring Frequency:[phone]}  Patient home BP readings are ranging: ***  We discussed {CHL HP Upstream Pharmacy discussion:[phone]}  Plan  Continue {CHL HP Upstream Pharmacy Plans:[phone]}      Hyperlipidemia   LDL goal < ***  Lipid Panel  No results found for: CHOL, TRIG, HDL, LDLCALC, LDLDIRECT  Hepatic Function Latest Ref Rng & Units 11/01/2017  Total Protein 6.5 - 8.1 g/dL 7.1  Albumin 3.5 - 5.0 g/dL 4.0  AST 15 - 41 U/L 17  ALT 0 - 44 U/L 14  Alk Phosphatase 38 - 126 U/L 75  Total Bilirubin 0.3 - 1.2 mg/dL 0.7     The ASCVD Risk score Mikey Bussing DC Jr., et al., 2013) failed to calculate for the following reasons:   Cannot find a previous HDL lab   Cannot find a previous total cholesterol lab   Patient has failed these meds in past: *** Patient is currently {CHL Controlled/Uncontrolled:[phone]} on the following medications:  . Atorvastatin  10 mg daily  We discussed:  {CHL HP Upstream Pharmacy [phone]}  Plan  Continue {CHL HP Upstream Pharmacy Plans:[phone]}   Arthritis   Patient has failed these meds in past: *** Patient is currently {CHL Controlled/[phone]} on the following medications:  . Tylenol 1300 mg CR daily . Tart Cherry ***  We discussed:  ***  Plan  Continue {CHL HP Upstream  Pharmacy Plans:[phone]}   Osteopenia / Osteoporosis   Last DEXA Scan: ***   T-Score femoral neck: ***  T-Score total hip: ***  T-Score lumbar spine: ***  T-Score forearm radius: ***  10-year probability of major osteoporotic fracture: ***  10-year probability of hip fracture: ***  No results found for: VD25OH   Patient {is;is not an osteoporosis candidate:23886}  Patient has failed these meds in past: alendronate Patient is currently {CHL Controlled/[phone]} on the following medications:  . ***  We discussed:  {Osteoporosis Counseling:23892}  Plan  Continue {CHL HP Upstream Pharmacy Plans:[phone]}  Health Maintenance   Patient is currently {CHL Controlled/[phone]} on the following medications:  . ***  We discussed:  ***  Plan  Continue {CHL HP Upstream Pharmacy XBLTJ:[id number]}  Vaccines   Reviewed and discussed patient's vaccination history.     There is no immunization history on file for this patient.  Plan  Recommended patient receive *** vaccine in *** office.   Medication Management   Pt uses [facility] for all medications Uses pill box? {Yes or If no, why not?:20788} Pt endorses ***% compliance  We discussed: ***  Plan  {US Pharmacy TMAU:63335}    Follow up: *** month phone visit  ***

## 2019-10-02 ENCOUNTER — Other Ambulatory Visit: Payer: Self-pay | Admitting: Cardiology

## 2019-10-02 MED ORDER — RIVAROXABAN 20 MG PO TABS: 20.0000 mg | ORAL_TABLET | Freq: Every day | ORAL | 3 refills | Status: DC

## 2019-10-02 NOTE — Telephone Encounter (Signed)
*  STAT* If patient is at the pharmacy, call can be transferred to refill team.   1. Which medications need to be refilled? (please list name of each medication and dose if known)  rivaroxaban (XARELTO) 20 MG TABS tablet  2. Which pharmacy/location (including street and city if local pharmacy) is medication to be sent to? [place], [place]  3. Do they need a 30 day or 90 day supply? 30 with refills  Pt is out of medication  Pt contacted her PCP but they would not fill it. Pt would like Dr. Curt Bears to fill this rx from now on

## 2019-10-02 NOTE — Telephone Encounter (Signed)
Refill sent in per request.  

## 2019-10-10 ENCOUNTER — Ambulatory Visit: Payer: PPO

## 2019-10-10 ENCOUNTER — Other Ambulatory Visit: Payer: Self-pay

## 2019-10-10 DIAGNOSIS — Z23 Encounter for immunization: Secondary | ICD-10-CM

## 2019-10-10 NOTE — Progress Notes (Signed)
   Covid-19 Vaccination Clinic  Name:  Helen West    MRN: [id number] DOB: 03-09-1955  10/10/2019  [name] was observed post Covid-19 immunization for 15 minutes without incident. She was provided with Vaccine Information Sheet and instruction to access the V-Safe system.   [name] was instructed to call 911 with any severe reactions post vaccine: Marland Kitchen Difficulty breathing  . Swelling of face and throat  . A fast heartbeat  . A bad rash all over body  . Dizziness and weakness

## 2019-10-31 ENCOUNTER — Telehealth: Payer: Self-pay | Admitting: Cardiology

## 2019-10-31 NOTE — Telephone Encounter (Signed)
Patient calling the office for samples of medication:   1.  What medication and dosage are you requesting samples for? rivaroxaban (XARELTO) 20 MG TABS tablet dronedarone (MULTAQ) 400 MG tablet 2.  Are you currently out of this medication? No   Okay to leave detailed VM.

## 2019-10-31 NOTE — Telephone Encounter (Signed)
Called patient informed her that we do not have any samples of either requested information.

## 2019-10-31 NOTE — Telephone Encounter (Signed)
This is a [facility] pt °

## 2019-11-02 ENCOUNTER — Telehealth: Payer: Self-pay | Admitting: *Deleted

## 2019-11-02 NOTE — Telephone Encounter (Signed)
CALLED PATIENT TO ALTER  FU APPT. ON 11-06-19 DUE TO DR. KINARD BEING IN THE OR, RESCHEDULED FOR 11-16-19 @ 8:30 AM , LVM FOR A RETURN CALL

## 2019-11-06 ENCOUNTER — Ambulatory Visit: Payer: PPO | Admitting: Radiation Oncology

## 2019-11-06 ENCOUNTER — Telehealth: Payer: Self-pay | Admitting: *Deleted

## 2019-11-06 NOTE — Telephone Encounter (Signed)
RETURNED PATIENT'S PHONE CALL, SPOKE WITH PATIENT. ?

## 2019-11-16 ENCOUNTER — Encounter: Payer: Self-pay | Admitting: Radiation Oncology

## 2019-11-16 ENCOUNTER — Ambulatory Visit
Admission: RE | Admit: 2019-11-16 | Discharge: 2019-11-16 | Disposition: A | Discharge status: Home / Self Care | Payer: PPO | Source: Ambulatory Visit | Attending: Radiation Oncology | Admitting: Radiation Oncology

## 2019-11-16 ENCOUNTER — Other Ambulatory Visit: Payer: Self-pay

## 2019-11-16 VITALS — BP 157/74 | HR 62 | Temp 98.1°F | Resp 18 | Ht 62.0 in | Wt 268.0 lb

## 2019-11-16 DIAGNOSIS — Z08 Encounter for follow-up examination after completed treatment for malignant neoplasm: Secondary | ICD-10-CM | POA: Diagnosis not present

## 2019-11-16 DIAGNOSIS — C541 Malignant neoplasm of endometrium: Secondary | ICD-10-CM

## 2019-11-16 DIAGNOSIS — Z8542 Personal history of malignant neoplasm of other parts of uterus: Secondary | ICD-10-CM | POA: Diagnosis not present

## 2019-11-16 NOTE — Progress Notes (Signed)
Patient here for a f/u visit with Dr. Sondra Come. Last radiation 11/29.  Patient denies pain, bleeding, bowel or bladder problems.  BP (!) 157/74 (BP Location: Left Arm, Patient Position: Sitting)   Pulse 62   Temp 98.1 F (36.7 C) (Oral)   Resp 18   Ht 5\' 2"  (1.575 m)   Wt 268 lb (121.6 kg)   SpO2 100%   BMI 49.02 kg/m   Wt Readings from Last 3 Encounters:  11/16/19 268 lb (121.6 kg)  08/16/19 268 lb 9.6 oz (121.8 kg)  05/04/19 264 lb (119.7 kg)

## 2019-11-16 NOTE — Progress Notes (Signed)
°  Radiation Oncology         [phone] ________________________________  Name: KAYIA BILLINGER MRN: [id number]  Date: 11/16/2019  DOB: 03/06/1955  Follow-Up Visit Note  CC: [name], MD  [name], MD    ICD-10-CM   1. Endometrial cancer ([facility])  C54.1     Diagnosis: Stage IB (pT1b, pN0) Invasive Endometrioid Adenocarcinoma, Grade 1   Interval Since Last Radiation: One year, ten months, and three days  11/14, 11/21, 11/27, 12/4, 01/12/18:  Vaginal cuff, 6 Gy in 5 fractions for a total dose of 30 Gy  Narrative:  The patient returns today for routine follow-up. She was last seen by Dr. Denman George on 08/16/2019, during which time there was no evidence of recurrence on exam.  On review of systems, she reports no complaints. She denies pelvic pain, vaginal bleeding, and changes in bowel/bladder patterns.  She is not using her vaginal dilator.  ALLERGIES:  has No Known Allergies.  Meds: Current Outpatient Medications  Medication Sig Dispense Refill   acetaminophen (TYLENOL) 650 MG CR tablet Take 1,300 mg by mouth daily.     atorvastatin (LIPITOR) 10 MG tablet Take 10 mg by mouth daily.     diltiazem (CARDIZEM CD) 180 MG 24 hr capsule Take 180 mg by mouth daily.     dronedarone (MULTAQ) 400 MG tablet Take 1 tablet (400 mg total) by mouth 2 (two) times daily with a meal. 180 tablet 3   metoprolol tartrate (LOPRESSOR) 25 MG tablet TAKE ONE TABLET TWICE DAILY 60 tablet 5   rivaroxaban (XARELTO) 20 MG TABS tablet Take 1 tablet (20 mg total) by mouth daily with supper. 30 tablet 3   No current facility-administered medications for this encounter.    Physical Findings: The patient is in no acute distress. Patient is alert and oriented.  height is 5\' 2"  (1.575 m) and weight is 268 lb (121.6 kg). Her oral temperature is 98.1 F (36.7 C). Her blood pressure is 157/74 (abnormal) and her pulse is 62. Her respiration is 18 and oxygen saturation is 100%. .   Lungs are clear to  auscultation bilaterally. Heart has regular rate and rhythm. No palpable cervical, supraclavicular, or axillary adenopathy. Abdomen soft, non-tender, normal bowel sounds. On pelvic examination the external genitalia are unremarkable. A speculum exam was performed. No adhesions noted. No mucosal lesions noted in the vaginal vault. The vaginal cuff is intact. No pelvic masses appreciated on bimanual pelvic examination.  Lab Findings: Lab Results  Component Value Date   WBC 8.6 11/03/2017   HGB 12.5 11/03/2017   HCT 39.0 11/03/2017   MCV 96.5 11/03/2017   PLT 285 11/03/2017    Radiographic Findings: No results found.  Impression:  Stage IB (pT1b, pN0) Invasive Endometrioid Adenocarcinoma, Grade 1.    No evidence of recurrence on clinical exam today.  Plan:  The patient will follow-up with Dr. Denman George in three months and with radiation oncology in nine months.  Total time spent in this encounter was 22 minutes which included reviewing the patient's most recent follow-up with Dr. Denman George, physical examination, and documentation.  ____________________________________   Blair Promise, PhD, MD  This document serves as a record of services personally performed by Gery Pray, MD. It was created on his behalf by Clerance Lav, a trained medical scribe. The creation of this record is based on the scribe's personal observations and the provider's statements to them. This document has been checked and approved by the attending provider.

## 2019-12-25 ENCOUNTER — Encounter: Payer: Self-pay | Admitting: Family Medicine

## 2019-12-25 ENCOUNTER — Other Ambulatory Visit: Payer: Self-pay

## 2019-12-25 ENCOUNTER — Ambulatory Visit (INDEPENDENT_AMBULATORY_CARE_PROVIDER_SITE_OTHER): Payer: PPO | Admitting: Family Medicine

## 2019-12-25 VITALS — BP 126/88 | HR 89 | Temp 96.8°F | Ht 62.0 in | Wt 267.0 lb

## 2019-12-25 DIAGNOSIS — M8580 Other specified disorders of bone density and structure, unspecified site: Secondary | ICD-10-CM

## 2019-12-25 DIAGNOSIS — E782 Mixed hyperlipidemia: Secondary | ICD-10-CM

## 2019-12-25 DIAGNOSIS — Z0000 Encounter for general adult medical examination without abnormal findings: Secondary | ICD-10-CM | POA: Diagnosis not present

## 2019-12-25 DIAGNOSIS — Z1231 Encounter for screening mammogram for malignant neoplasm of breast: Secondary | ICD-10-CM | POA: Diagnosis not present

## 2019-12-25 DIAGNOSIS — Z13820 Encounter for screening for osteoporosis: Secondary | ICD-10-CM

## 2019-12-25 NOTE — Patient Instructions (Addendum)
Keep food diary.  Preventive Care 66 Years and Older, Female Preventive care refers to lifestyle choices and visits with your health care provider that can promote health and wellness. This includes:  A yearly physical exam. This is also called an annual well check.  Regular dental and eye exams.  Immunizations.  Screening for certain conditions.  Healthy lifestyle choices, such as diet and exercise. What can I expect for my preventive care visit? Physical exam Your health care provider will check:  Height and weight. These may be used to calculate body mass index (BMI), which is a measurement that tells if you are at a healthy weight.  Heart rate and blood pressure.  Your skin for abnormal spots. Counseling Your health care provider may ask you questions about:  Alcohol, tobacco, and drug use.  Emotional well-being.  Home and relationship well-being.  Sexual activity.  Eating habits.  History of falls.  Memory and ability to understand (cognition).  Work and work Statistician.  Pregnancy and menstrual history. What immunizations do I need?  Influenza (flu) vaccine  This is recommended every year. Tetanus, diphtheria, and pertussis (Tdap) vaccine  You may need a Td booster every 10 years. Varicella (chickenpox) vaccine  You may need this vaccine if you have not already been vaccinated. Zoster (shingles) vaccine  You may need this after age 66. Pneumococcal conjugate (PCV13) vaccine  One dose is recommended after age 66. Pneumococcal polysaccharide (PPSV23) vaccine  One dose is recommended after age 15. Measles, mumps, and rubella (MMR) vaccine  You may need at least one dose of MMR if you were born in 1957 or later. You may also need a second dose. Meningococcal conjugate (MenACWY) vaccine  You may need this if you have certain conditions. Hepatitis A vaccine  You may need this if you have certain conditions or if you travel or work in places  where you may be exposed to hepatitis A. Hepatitis B vaccine  You may need this if you have certain conditions or if you travel or work in places where you may be exposed to hepatitis B. Haemophilus influenzae type b (Hib) vaccine  You may need this if you have certain conditions. You may receive vaccines as individual doses or as more than one vaccine together in one shot (combination vaccines). Talk with your health care provider about the risks and benefits of combination vaccines. What tests do I need? Blood tests  Lipid and cholesterol levels. These may be checked every 5 years, or more frequently depending on your overall health.  Hepatitis C test.  Hepatitis B test. Screening  Lung cancer screening. You may have this screening every year starting at age 20 if you have a 30-pack-year history of smoking and currently smoke or have quit within the past 15 years.  Colorectal cancer screening. All adults should have this screening starting at age 66 and continuing until age 58. Your health care provider may recommend screening at age 11 if you are at increased risk. You will have tests every 1-10 years, depending on your results and the type of screening test.  Diabetes screening. This is done by checking your blood sugar (glucose) after you have not eaten for a while (fasting). You may have this done every 1-3 years.  Mammogram. This may be done every 1-2 years. Talk with your health care provider about how often you should have regular mammograms.  BRCA-related cancer screening. This may be done if you have a family history of breast, ovarian,  tubal, or peritoneal cancers. Other tests  Sexually transmitted disease (STD) testing.  Bone density scan. This is done to screen for osteoporosis. You may have this done starting at age 66. Follow these instructions at home: Eating and drinking  Eat a diet that includes fresh fruits and vegetables, whole grains, lean protein, and low-fat  dairy products. Limit your intake of foods with high amounts of sugar, saturated fats, and salt.  Take vitamin and mineral supplements as recommended by your health care provider.  Do not drink alcohol if your health care provider tells you not to drink.  If you drink alcohol: ? Limit how much you have to 0-1 drink a day. ? Be aware of how much alcohol is in your drink. In the [place] one drink equals one 12 oz bottle of beer (355 mL), one 5 oz glass of wine (148 mL), or one 1 oz glass of hard liquor (44 mL). Lifestyle  Take daily care of your teeth and gums.  Stay active. Exercise for at least 30 minutes on 5 or more days each week.  Do not use any products that contain nicotine or tobacco, such as cigarettes, e-cigarettes, and chewing tobacco. If you need help quitting, ask your health care provider.  If you are sexually active, practice safe sex. Use a condom or other form of protection in order to prevent STIs (sexually transmitted infections).  Talk with your health care provider about taking a low-dose aspirin or statin. What's next?  Go to your health care provider once a year for a well check visit.  Ask your health care provider how often you should have your eyes and teeth checked.  Stay up to date on all vaccines. This information is not intended to replace advice given to you by your health care provider. Make sure you discuss any questions you have with your health care provider. Document Revised: 01/13/2018 Document Reviewed: 01/13/2018 Elsevier Patient Education  2020 [place].  Preventing Osteoporosis, Adult Osteoporosis is a condition that causes the bones to lose density. This means that the bones become thinner, and the normal spaces in bone tissue become larger. Low bone density can make the bones weak and cause them to break more easily. Osteoporosis cannot always be prevented, but you can take steps to lower your risk of developing this condition. How can  this condition affect me? If you develop osteoporosis, you will be more likely to break bones in your wrist, spine, or hip. Even a minor accident or injury can be enough to break weak bones. The bones will also be slower to heal. Osteoporosis can cause other problems as well, such as a stooped posture or trouble with movement. Osteoporosis can occur with aging. As you get older, you may lose bone tissue more quickly, or it may be replaced more slowly. Osteoporosis is more likely to develop if you have poor nutrition or do not get enough calcium or vitamin D. Other lifestyle factors can also play a role. By eating a well-balanced diet and making lifestyle changes, you can help keep your bones strong and healthy, lowering your chances of developing osteoporosis. What can increase my risk? The following factors may make you more likely to develop osteoporosis:  Having a family history of the condition.  Having poor nutrition or not getting enough calcium or vitamin D.  Using certain medicines, such as steroid medicines or antiseizure medicines.  Being any of the following: ? 71 years of age or older. ? Female. ?  A woman who has gone through menopause (is postmenopausal). ? White (Caucasian) or of Asian descent.  Smoking or having a history of smoking.  Not being physically active (being sedentary).  Having a small body frame. What actions can I take to prevent this?  Get enough calcium   Make sure you get enough calcium every day. Calcium is the most important mineral for bone health. Most people can get enough calcium from their diet, but supplements may be recommended for people who are at risk for osteoporosis. Follow these guidelines: ? If you are age 44 or younger, aim to get 1,000 mg of calcium every day. ? If you are older than age 29, aim to get 1,200 mg of calcium every day.  Good sources of calcium include: ? Dairy products, such as low-fat or nonfat milk, cheese, and  yogurt. ? Dark green leafy vegetables, such as bok choy and broccoli. ? Foods that have had calcium added to them (calcium-fortified foods), such as orange juice, cereal, bread, soy beverages, and tofu products. ? Nuts, such as almonds.  Check nutrition labels to see how much calcium is in a food or drink. Get enough vitamin D  Try to get enough vitamin D every day. Vitamin D is the most essential vitamin for bone health. It helps the body absorb calcium. Follow these guidelines for how much vitamin D to get from food: ? If you are age 62 or younger, aim to get at least 600 international units (IU) every day. Your health care provider may suggest more. ? If you are older than age 63, aim to get at least 800 international units every day. Your health care provider may suggest more.  Good sources of vitamin D in your diet include: ? Egg yolks. ? Oily fish, such as salmon, sardines, and tuna. ? Milk and cereal fortified with vitamin D.  Your body also makes vitamin D when you are out in the sun. Exposing the bare skin on your face, arms, legs, or back to the sun for no more than 30 minutes a day, 2 times a week is more than enough. Beyond that, make sure you use sunblock to protect your skin from sunburn, which increases your risk for skin cancer. Exercise  Stay active and get exercise every day.  Ask your health care provider what types of exercise are best for you. Weight-bearing and strength-building activities are important for building and maintaining healthy bones. Some examples of these types of activities include: ? Walking and hiking. ? Jogging and running. ? Dancing. ? Gym exercises. ? Lifting weights. ? Tennis and racquetball. ? Climbing stairs. ? Aerobics. Make other lifestyle changes  Do not use any products that contain nicotine or tobacco, such as cigarettes, e-cigarettes, and chewing tobacco. If you need help quitting, ask your health care provider.  Lose weight if you  are overweight.  If you drink alcohol: ? Limit how much you use to:  0-1 drink a day for nonpregnant women.  0-2 drinks a day for men. ? Be aware of how much alcohol is in your drink. In the [place] one drink equals one 12 oz bottle of beer (355 mL), one 5 oz glass of wine (148 mL), or one 1 oz glass of hard liquor (44 mL). Where to find support If you need help making changes to prevent osteoporosis, talk with your health care provider. You can ask for a referral to a diet and nutrition specialist (dietitian) and a physical therapist. Where  to find more information Learn more about osteoporosis from:  NIH Osteoporosis and Related [place]: www.bones.SouthExposed.es  [place] Office on [employer]: [place].tn  [place]: [employer].ee Summary  Osteoporosis is a condition that causes weak bones that are more likely to break.  Eat a healthy diet, making sure you get enough calcium and vitamin D, and stay active by getting regular exercise to help prevent osteoporosis.  Other ways to reduce your risk of osteoporosis include maintaining a healthy weight and avoiding alcohol and products that contain nicotine or tobacco. This information is not intended to replace advice given to you by your health care provider. Make sure you discuss any questions you have with your health care provider. Document Revised: 08/19/2018 Document Reviewed: 08/19/2018 Elsevier Patient Education  [place] Many factors influence your heart (coronary) health, including eating and exercise habits. Coronary risk increases with abnormal blood fat (lipid) levels. Heart-healthy meal planning includes limiting unhealthy fats, increasing healthy fats, and making other diet and lifestyle changes.  What are tips for following this plan? Cooking Cook foods using methods other than frying. Baking, boiling, grilling, and broiling are  all good options. Other ways to reduce fat include:  Removing the skin from poultry.  Removing all visible fats from meats.  Steaming vegetables in water or broth. Meal planning   At meals, imagine dividing your plate into fourths: ? Fill one-half of your plate with vegetables and green salads. ? Fill one-fourth of your plate with whole grains. ? Fill one-fourth of your plate with lean protein foods.  Eat 4-5 servings of vegetables per day. One serving equals 1 cup raw or cooked vegetable, or 2 cups raw leafy greens.  Eat 4-5 servings of fruit per day. One serving equals 1 medium whole fruit,  cup dried fruit,  cup fresh, frozen, or canned fruit, or  cup 100% fruit juice.  Eat more foods that contain soluble fiber. Examples include apples, broccoli, carrots, beans, peas, and barley. Aim to get 25-30 g of fiber per day.  Increase your consumption of legumes, nuts, and seeds to 4-5 servings per week. One serving of dried beans or legumes equals  cup cooked, 1 serving of nuts is  cup, and 1 serving of seeds equals 1 tablespoon. Fats  Choose healthy fats more often. Choose monounsaturated and polyunsaturated fats, such as olive and canola oils, flaxseeds, walnuts, almonds, and seeds.  Eat more omega-3 fats. Choose salmon, mackerel, sardines, tuna, flaxseed oil, and ground flaxseeds. Aim to eat fish at least 2 times each week.  Check food labels carefully to identify foods with trans fats or high amounts of saturated fat.  Limit saturated fats. These are found in animal products, such as meats, butter, and cream. Plant sources of saturated fats include palm oil, palm kernel oil, and coconut oil.  Avoid foods with partially hydrogenated oils in them. These contain trans fats. Examples are stick margarine, some tub margarines, cookies, crackers, and other baked goods.  Avoid fried foods. General information  Eat more home-cooked food and less restaurant, buffet, and fast  food.  Limit or avoid alcohol.  Limit foods that are high in starch and sugar.  Lose weight if you are overweight. Losing just 5-10% of your body weight can help your overall health and prevent diseases such as diabetes and heart disease.  Monitor your salt (sodium) intake, especially if you have high blood pressure. Talk with your health care provider about  your sodium intake.  Try to incorporate more vegetarian meals weekly. What foods can I eat? Fruits All fresh, canned (in natural juice), or frozen fruits. Vegetables Fresh or frozen vegetables (raw, steamed, roasted, or grilled). Green salads. Grains Most grains. Choose whole wheat and whole grains most of the time. Rice and pasta, including brown rice and pastas made with whole wheat. Meats and other proteins Lean, well-trimmed beef, veal, pork, and lamb. Chicken and [place] without skin. All fish and shellfish. Wild duck, rabbit, pheasant, and venison. Egg whites or low-cholesterol egg substitutes. Dried beans, peas, lentils, and tofu. Seeds and most nuts. Dairy Low-fat or nonfat cheeses, including ricotta and mozzarella. Skim or 1% milk (liquid, powdered, or evaporated). Buttermilk made with low-fat milk. Nonfat or low-fat yogurt. Fats and oils Non-hydrogenated (trans-free) margarines. Vegetable oils, including soybean, sesame, sunflower, olive, peanut, safflower, corn, canola, and cottonseed. Salad dressings or mayonnaise made with a vegetable oil. Beverages Water (mineral or sparkling). Coffee and tea. Diet carbonated beverages. Sweets and desserts Sherbet, gelatin, and fruit ice. Small amounts of dark chocolate. Limit all sweets and desserts. Seasonings and condiments All seasonings and condiments. The items listed above may not be a complete list of foods and beverages you can eat. Contact a dietitian for more options. What foods are not recommended? Fruits Canned fruit in heavy syrup. Fruit in cream or butter sauce. Fried  fruit. Limit coconut. Vegetables Vegetables cooked in cheese, cream, or butter sauce. Fried vegetables. Grains Breads made with saturated or trans fats, oils, or whole milk. Croissants. Sweet rolls. Donuts. High-fat crackers, such as cheese crackers. Meats and other proteins Fatty meats, such as hot dogs, ribs, sausage, bacon, rib-eye roast or steak. High-fat deli meats, such as salami and bologna. Caviar. Domestic duck and goose. Organ meats, such as liver. Dairy Cream, sour cream, cream cheese, and creamed cottage cheese. Whole milk cheeses. Whole or 2% milk (liquid, evaporated, or condensed). Whole buttermilk. Cream sauce or high-fat cheese sauce. Whole-milk yogurt. Fats and oils Meat fat, or shortening. Cocoa butter, hydrogenated oils, palm oil, coconut oil, palm kernel oil. Solid fats and shortenings, including bacon fat, salt pork, lard, and butter. Nondairy cream substitutes. Salad dressings with cheese or sour cream. Beverages Regular sodas and any drinks with added sugar. Sweets and desserts Frosting. Pudding. Cookies. Cakes. Pies. Milk chocolate or white chocolate. Buttered syrups. Full-fat ice cream or ice cream drinks. The items listed above may not be a complete list of foods and beverages to avoid. Contact a dietitian for more information. Summary  Heart-healthy meal planning includes limiting unhealthy fats, increasing healthy fats, and making other diet and lifestyle changes.  Lose weight if you are overweight. Losing just 5-10% of your body weight can help your overall health and prevent diseases such as diabetes and heart disease.  Focus on eating a balance of foods, including fruits and vegetables, low-fat or nonfat dairy, lean protein, nuts and legumes, whole grains, and heart-healthy oils and fats. This information is not intended to replace advice given to you by your health care provider. Make sure you discuss any questions you have with your health care  provider. Document Revised: 02/26/2017 Document Reviewed: 02/26/2017 Elsevier Patient Education  2020 [employer].

## 2019-12-25 NOTE — Progress Notes (Signed)
Subjective:  Patient ID: Helen West, female    DOB: [birth year]  Age: 64 y.o. MRN: [id number]  Chief Complaint  Patient presents with  . Annual Exam   HPI Encounter for general adult medical examination without abnormal findings  Physical ("At Risk" items are starred): Patient's last physical exam was 1 year ago .  Smoking: Life-long non-smoker ;  Pt eats a lot of vegetables and fruits. Chicken, fish. Avoids fried foods and sweets.  Physical Activity: Stationary bike 3 times a week - 15-20 minutes.  Alcohol/Drug Use: Is a drinker ; No illicit drug use ;  Patient is not afflicted from Stress Incontinence and Urge Incontinence  Safety: reviewed ; Patient wears a seat belt, has smoke detectors, has carbon monoxide detectors, practices appropriate gun safety, and does not wear sunscreen with extended sun exposure. Dental Care: biannual cleanings, brushes and flosses daily. Ophthalmology/Optometry: Goes every 4 years Hearing loss: none Vision impairments: Every 3 years  History of Uterine cancer - 2019. Underwent THA BSO, radiation. No chemo. Follows every 6 months with oncology and gynecology every 6 months.   DEXA- 10/01/2017. Osteopenia. Colonoscopy-09/04/2016 Mammogram 12/14/2018  Fall Risk  12/25/2019 12/25/2019 08/29/2019 12/22/2018 12/16/2017  Falls in the past year? - 0 0 0 0  Comment - - [facility] Telephone Survey: data to providers prior to load [employer] Telephone Survey: data to providers prior to load -  Number falls in past yr: - 0 - - 0  Injury with Fall? - 0 - - 0  Risk for fall due to : - Impaired vision - - -  Follow up Falls evaluation completed;Falls prevention discussed Falls evaluation completed - - -     Depression screen [facility] 2/9 12/25/2019 12/16/2017 11/15/2017  Decreased Interest 0 0 0  Down, Depressed, Hopeless 0 0 0  PHQ - 2 Score 0 0 0       Functional Status Survey: Is the patient deaf or have difficulty hearing?: No Does the patient have difficulty  seeing, even when wearing glasses/contacts?: No Does the patient have difficulty concentrating, remembering, or making decisions?: No Does the patient have difficulty walking or climbing stairs?: No Does the patient have difficulty dressing or bathing?: No Does the patient have difficulty doing errands alone such as visiting a doctor's office or shopping?: No   Social Hx   Social History   Socioeconomic History  . Marital status: Married    Spouse name: Not on file  . Number of children: Not on file  . Years of education: Not on file  . Highest education level: Not on file  Occupational History  . Not on file  Tobacco Use  . Smoking status: Never Smoker  . Smokeless tobacco: Never Used  Vaping Use  . Vaping Use: Never used  Substance and Sexual Activity  . Alcohol use: Yes    Comment: occasional social  . Drug use: Never  . Sexual activity: Not on file  Other Topics Concern  . Not on file  Social History Narrative  . Not on file   Social Determinants of Health   Financial Resource Strain:   . Difficulty of Paying Living Expenses: Not on file  Food Insecurity:   . Worried About Charity fundraiser in the Last Year: Not on file  . Ran Out of Food in the Last Year: Not on file  Transportation Needs:   . Lack of Transportation (Medical): Not on file  . Lack of Transportation (Non-Medical): Not on file  Physical Activity:   . Days of Exercise per Week: Not on file  . Minutes of Exercise per Session: Not on file  Stress:   . Feeling of Stress : Not on file  Social Connections:   . Frequency of Communication with Friends and Family: Not on file  . Frequency of Social Gatherings with Friends and Family: Not on file  . Attends Religious Services: Not on file  . Active Member of Clubs or Organizations: Not on file  . Attends Archivist Meetings: Not on file  . Marital Status: Not on file   Past Medical History:  Diagnosis Date  . A-fib ([place])   . Anemia   .  Arthritis   . Cancer ([place])   . Complication of anesthesia    Loss of vocal/voice x 2 months  . Dysrhythmia   . Headache   . High cholesterol   . Obesity   . Post-menopausal bleeding    Family History  Problem Relation Age of Onset  . Aneurysm Mother   . Emphysema Father   . Cancer Paternal Uncle     Review of Systems  Constitutional: Negative for chills, fatigue and fever.  HENT: Negative for congestion, ear pain, rhinorrhea and sore throat.   Respiratory: Negative for cough and shortness of breath.   Cardiovascular: Negative for chest pain.  Gastrointestinal: Negative for abdominal pain, constipation, diarrhea, nausea and vomiting.  Genitourinary: Negative for dysuria and urgency.  Musculoskeletal: Negative for back pain and myalgias.  Neurological: Negative for dizziness, weakness, light-headedness and headaches.  Psychiatric/Behavioral: Negative for dysphoric mood. The patient is not nervous/anxious.      Objective:  BP 126/88   Pulse 89   Temp (!) 96.8 F (36 C)   Ht 5\' 2"  (1.575 m)   Wt 267 lb (121.1 kg)   SpO2 99%   BMI 48.83 kg/m   BP/Weight 12/25/2019 11/16/2019 9/98/3382  Systolic BP 505 397 673  Diastolic BP 88 74 77  Wt. (Lbs) 267 268 268.6  BMI 48.83 49.02 49.13    Physical Exam Vitals reviewed.  Constitutional:      Appearance: Normal appearance. She is obese.  Cardiovascular:     Rate and Rhythm: Normal rate and regular rhythm.     Pulses: Normal pulses.     Heart sounds: Normal heart sounds.  Pulmonary:     Effort: Pulmonary effort is normal. No respiratory distress.     Breath sounds: Normal breath sounds.  Neurological:     Mental Status: She is alert and oriented to person, place, and time.  Psychiatric:        Mood and Affect: Mood normal.        Behavior: Behavior normal.     Lab Results  Component Value Date   WBC 8.6 11/03/2017   HGB 12.5 11/03/2017   HCT 39.0 11/03/2017   PLT 285 11/03/2017   GLUCOSE 126 (H) 11/03/2017    ALT 14 11/01/2017   AST 17 11/01/2017   NA 142 11/03/2017   K 5.2 (H) 11/03/2017   CL 105 11/03/2017   CREATININE 0.77 11/03/2017   BUN 17 11/03/2017   CO2 29 11/03/2017      Assessment & Plan:  1. Encounter for Medicare annual wellness exam   Work on eating healthy and exercise.   Discuss health care POA with husband. Education given.   2. Encounter for screening mammogram for malignant neoplasm of breast - MM DIGITAL SCREENING BILATERAL  3. Mixed hyperlipidemia Return for  fastin visit in December.  Low fat diet.   4. Osteopenia, unspecified location Recommend calcium with vitamin D.  - DG Bone Density   This is a list of the screening recommended for you and due dates:  Health Maintenance  Topic Date Due  .  Hepatitis C: One time screening is recommended by Center for Disease Control  (CDC) for  adults born from [birth year] through 1967.   Never done  . Flu Shot  05/02/2020*  . Tetanus Vaccine  12/24/2020*  . Pneumonia vaccines (1 of 2 - PCV13) 12/24/2020*  . Mammogram  12/13/2020  . Colon Cancer Screening  09/05/2026  . DEXA scan (bone density measurement)  Completed  . COVID-19 Vaccine  Completed  *Topic was postponed. The date shown is not the original due date.     AN INDIVIDUALIZED CARE PLAN: was established or reinforced today.   SELF MANAGEMENT: The patient and I together assessed ways to personally work towards obtaining the recommended goals  Support needs The patient and/or family needs were assessed and services were offered and not necessary at this time.    Follow-up: Return in about 3 weeks (around 01/15/2020) for fasting visit.  [name], MD [facility] [phone]

## 2019-12-26 ENCOUNTER — Other Ambulatory Visit: Payer: Self-pay | Admitting: Cardiology

## 2020-01-21 NOTE — Progress Notes (Signed)
No show

## 2020-01-22 ENCOUNTER — Ambulatory Visit (INDEPENDENT_AMBULATORY_CARE_PROVIDER_SITE_OTHER): Payer: PPO | Admitting: Family Medicine

## 2020-01-22 DIAGNOSIS — E782 Mixed hyperlipidemia: Secondary | ICD-10-CM

## 2020-01-25 ENCOUNTER — Telehealth: Payer: Self-pay | Admitting: *Deleted

## 2020-01-25 NOTE — Telephone Encounter (Signed)
CALLED PATIENT TO INFORM OF FU WITH DR. Denman George ON 02-15-20 - ARRIVAL TIME- 2 PM, SPOKE WITH PATIENT AND SHE IS AWARE OF THIS APPT.

## 2020-01-31 ENCOUNTER — Other Ambulatory Visit: Payer: Self-pay | Admitting: Cardiology

## 2020-01-31 NOTE — Telephone Encounter (Addendum)
Xarelto 20mg  refill request received. Pt is 64 years old, weight-121.1kg, Crea-0.80 on 09/14/2018 via [facility] at [name], last seen by Dr. 11/14/2018 on 04/10/2019, Diagnosis-Afib, CrCl-132.38ml/min;   Pt needs labs. Called PCP and left a message to call back regarding this and had to leave a message.

## 2020-02-01 ENCOUNTER — Telehealth: Payer: Self-pay

## 2020-02-01 NOTE — Telephone Encounter (Signed)
Pt asked that her pharmacy send a request for Diltiazem to Dr. Elberta Fortis. Sherri spoke to pt.  Dr. Elberta Fortis never prescribed rx for this pt. Pt needs to refer this matter to her primary care physician, Dr. Sedalia Muta

## 2020-02-01 NOTE — Telephone Encounter (Signed)
Called pt since haven't heard back from Dr. Sedalia Muta office PCP to ask if she was out of Xarelto and she states she took her last one last night. She is aware I can send in a refill but will need labs as this med is based on certain labs. Advised the importance of calling her PCP and getting her appt as well. If she cannot go to that appt soon then we will wait until she sees Dr. Elberta Fortis and get labs (CBC, BMET or CMET at that time). Pt is aware I will send in a small supply until appts/labs.   After speaking with the pt, received a call from Katie with Dr. Sedalia Muta office and she was able to let me know that the pt did not come to last appt, therefore, she needs to call and reschedule. Already advised pt to call to get an appt and the I would call regarding labs. Per Florentina Addison, they normally order certain labs and the ones needed were included so I will not send an order at this time. Will watch the chart to see when she gets her appt with them so I can ensure labs are done.

## 2020-02-13 ENCOUNTER — Other Ambulatory Visit: Payer: Self-pay

## 2020-02-13 ENCOUNTER — Encounter: Payer: Self-pay | Admitting: Family Medicine

## 2020-02-13 ENCOUNTER — Ambulatory Visit (INDEPENDENT_AMBULATORY_CARE_PROVIDER_SITE_OTHER): Payer: PPO | Admitting: Family Medicine

## 2020-02-13 VITALS — BP 168/98 | HR 100 | Temp 97.2°F | Ht 62.0 in | Wt 257.0 lb

## 2020-02-13 DIAGNOSIS — I10 Essential (primary) hypertension: Secondary | ICD-10-CM | POA: Diagnosis not present

## 2020-02-13 DIAGNOSIS — E782 Mixed hyperlipidemia: Secondary | ICD-10-CM

## 2020-02-13 DIAGNOSIS — Z6841 Body Mass Index (BMI) 40.0 and over, adult: Secondary | ICD-10-CM | POA: Diagnosis not present

## 2020-02-13 DIAGNOSIS — I4811 Longstanding persistent atrial fibrillation: Secondary | ICD-10-CM

## 2020-02-13 DIAGNOSIS — Z23 Encounter for immunization: Secondary | ICD-10-CM | POA: Diagnosis not present

## 2020-02-13 MED ORDER — LOSARTAN POTASSIUM 50 MG PO TABS: 50.0000 mg | ORAL_TABLET | Freq: Every day | ORAL | 0 refills | Status: DC

## 2020-02-13 MED ORDER — MULTAQ 400 MG PO TABS: 400.0000 mg | ORAL_TABLET | Freq: Two times a day (BID) | ORAL | 0 refills | Status: DC

## 2020-02-13 NOTE — Progress Notes (Signed)
Subjective:  Patient ID: Helen West, female    DOB: 1955/11/30  Age: 65 y.o. MRN: [id number]  Chief Complaint  Patient presents with  . Atrial Fibrillation    HPI  Atrial fibrillation/flutter: on xarelto, multaq, and metoprolol. Patient is seeing Dr. Curt Bears.  Morbid obesity: Fruit/spinach smoothie in am, avoiding carbs. Eating protein (low fat) Eats from 5:30 am to 2:30 pm.  Riding bike 3 times per week. Has lost 11 lbs since Christmas! Hypertension: 160/80s at home. Asymptomatic. Hyperlipidemia: used to be on medicine, but stopped it.   Current Outpatient Medications on File Prior to Visit  Medication Sig Dispense Refill  . acetaminophen (TYLENOL) 650 MG CR tablet Take 1,300 mg by mouth daily.    . metoprolol tartrate (LOPRESSOR) 25 MG tablet TAKE ONE TABLET TWICE DAILY 60 tablet 3  . XARELTO 20 MG TABS tablet TAKE ONE (1) TABLET ONCE DAILY WITH SUPPER 30 tablet 0   No current facility-administered medications on file prior to visit.   Past Medical History:  Diagnosis Date  . A-fib ([place])   . Anemia   . Arthritis   . Cancer ([place])   . Complication of anesthesia    Loss of vocal/voice x 2 months  . Dysrhythmia   . Headache   . High cholesterol   . Obesity   . Post-menopausal bleeding    Past Surgical History:  Procedure Laterality Date  . ROBOTIC ASSISTED TOTAL HYSTERECTOMY WITH BILATERAL SALPINGO OOPHERECTOMY Bilateral 11/02/2017   Procedure: XI ROBOTIC ASSISTED TOTAL HYSTERECTOMY WITH BILATERAL SALPINGO OOPHORECTOMY;  Surgeon: [name], MD;  Location: [facility];  Service: Gynecology;  Laterality: Bilateral;  . SENTINEL NODE BIOPSY N/A 11/02/2017   Procedure: SENTINEL NODE BIOPSY;  Surgeon: [name], MD;  Location: [facility];  Service: Gynecology;  Laterality: N/A;  . TONSILLECTOMY    . TOTAL HIP ARTHROPLASTY    . TUBAL LIGATION      Family History  Problem Relation Age of Onset  . Aneurysm Mother   . Emphysema Father   . Cancer Paternal Uncle    Social History    Socioeconomic History  . Marital status: Married    Spouse name: Not on file  . Number of children: Not on file  . Years of education: Not on file  . Highest education level: Not on file  Occupational History  . Not on file  Tobacco Use  . Smoking status: Never Smoker  . Smokeless tobacco: Never Used  Vaping Use  . Vaping Use: Never used  Substance and Sexual Activity  . Alcohol use: Yes    Comment: occasional social  . Drug use: Never  . Sexual activity: Not on file  Other Topics Concern  . Not on file  Social History Narrative  . Not on file   Social Determinants of Health   Financial Resource Strain: Not on file  Food Insecurity: Not on file  Transportation Needs: Not on file  Physical Activity: Not on file  Stress: Not on file  Social Connections: Not on file    Review of Systems  Constitutional: Negative for chills, fatigue and fever.  HENT: Negative for congestion, rhinorrhea and sore throat.   Respiratory: Negative for cough and shortness of breath.   Cardiovascular: Negative for chest pain.  Gastrointestinal: Negative for abdominal pain, constipation, diarrhea, nausea and vomiting.  Genitourinary: Negative for dysuria and urgency.  Musculoskeletal: Positive for arthralgias and back pain. Negative for myalgias.  Neurological: Negative for dizziness, weakness, light-headedness and headaches.  Psychiatric/Behavioral: Negative for dysphoric mood. The patient is not nervous/anxious.      Objective:  BP (!) 168/98   Pulse 100   Temp (!) 97.2 F (36.2 C)   Ht 5\' 2"  (1.575 m)   Wt 257 lb (116.6 kg)   BMI 47.01 kg/m   BP/Weight 02/13/2020 12/25/2019 52/84/1324  Systolic BP 401 027 253  Diastolic BP 98 88 74  Wt. (Lbs) 257 267 268  BMI 47.01 48.83 49.02    Physical Exam Vitals reviewed.  Constitutional:      Appearance: Normal appearance. She is obese.  Neck:     Vascular: No carotid bruit.  Cardiovascular:     Rate and Rhythm: Normal rate and  regular rhythm.     Pulses: Normal pulses.     Heart sounds: Normal heart sounds.  Pulmonary:     Effort: Pulmonary effort is normal. No respiratory distress.     Breath sounds: Normal breath sounds.  Abdominal:     General: Abdomen is flat. Bowel sounds are normal.     Palpations: Abdomen is soft.     Tenderness: There is no abdominal tenderness.  Neurological:     Mental Status: She is alert and oriented to person, place, and time.  Psychiatric:        Mood and Affect: Mood normal.        Behavior: Behavior normal.     Diabetic Foot Exam - Simple   No data filed      Lab Results  Component Value Date   WBC 8.6 11/03/2017   HGB 12.5 11/03/2017   HCT 39.0 11/03/2017   PLT 285 11/03/2017   GLUCOSE 126 (H) 11/03/2017   ALT 14 11/01/2017   AST 17 11/01/2017   NA 142 11/03/2017   K 5.2 (H) 11/03/2017   CL 105 11/03/2017   CREATININE 0.77 11/03/2017   BUN 17 11/03/2017   CO2 29 11/03/2017      Assessment & Plan:   1. Mixed hyperlipidemia Low fat diet. Check lipid panel  2. Longstanding persistent atrial fibrillation (HCC) The current medical regimen is effective;  continue present plan and medications. Management per specialist. Dr. Curt Bears.  3. Primary hypertension Start on losartan 50 qd. Check bp daily.  Call with daily readings in 3 weeks. - CBC with Differential/Platelet - Comprehensive metabolic panel - Lipid panel - TSH - losartan (COZAAR) 50 MG tablet; Take 1 tablet (50 mg total) by mouth daily.  Dispense: 90 tablet; Refill: 0  4. Need for 23-polyvalent pneumococcal polysaccharide vaccine - Pneumococcal polysaccharide vaccine 23-valent greater than or equal to 2yo subcutaneous/IM  5. Morbid obesity ([place]) Recommend continue to work on eating healthy diet and exercise.  6. BMI 45.0-49.9, adult ([place])  Recommend continue to work on eating healthy diet and exercise.   Meds ordered this encounter  Medications  . dronedarone (MULTAQ) 400 MG tablet     Sig: Take 1 tablet (400 mg total) by mouth 2 (two) times daily with a meal.    Dispense:  1 tablet    Refill:  0    Pt stopping Flecainide today  . losartan (COZAAR) 50 MG tablet    Sig: Take 1 tablet (50 mg total) by mouth daily.    Dispense:  90 tablet    Refill:  0    Orders Placed This Encounter  Procedures  . Pneumococcal polysaccharide vaccine 23-valent greater than or equal to 2yo subcutaneous/IM  . CBC with Differential/Platelet  . Comprehensive metabolic panel  .  Lipid panel  . TSH     Follow-up: Return in about 3 months (around 05/13/2020) for fasting.  An After Visit Summary was printed and given to the patient.  [name], MD [facility] [phone]

## 2020-02-13 NOTE — Patient Instructions (Addendum)
Start on losartan 50 mg once daily.  Check bp daily.  Call with readings in 3 weeks.  Keep up [place]!   Hypertension, Adult Hypertension is another name for high blood pressure. High blood pressure forces your heart to work harder to pump blood. This can cause problems over time. There are two numbers in a blood pressure reading. There is a top number (systolic) over a bottom number (diastolic). It is best to have a blood pressure that is below 120/80. Healthy choices can help lower your blood pressure, or you may need medicine to help lower it. What are the causes? The cause of this condition is not known. Some conditions may be related to high blood pressure. What increases the risk?  Smoking.  Having type 2 diabetes mellitus, high cholesterol, or both.  Not getting enough exercise or physical activity.  Being overweight.  Having too much fat, sugar, calories, or salt (sodium) in your diet.  Drinking too much alcohol.  Having long-term (chronic) kidney disease.  Having a family history of high blood pressure.  Age. Risk increases with age.  Race. You may be at higher risk if you are African American.  Gender. Men are at higher risk than women before age 63. After age 84, women are at higher risk than men.  Having obstructive sleep apnea.  Stress. What are the signs or symptoms?  High blood pressure may not cause symptoms. Very high blood pressure (hypertensive crisis) may cause: ? Headache. ? Feelings of worry or nervousness (anxiety). ? Shortness of breath. ? Nosebleed. ? A feeling of being sick to your stomach (nausea). ? Throwing up (vomiting). ? Changes in how you see. ? Very bad chest pain. ? Seizures. How is this treated?  This condition is treated by making healthy lifestyle changes, such as: ? Eating healthy foods. ? Exercising more. ? Drinking less alcohol.  Your health care provider may prescribe medicine if lifestyle changes are not enough to  get your blood pressure under control, and if: ? Your top number is above 130. ? Your bottom number is above 80.  Your personal target blood pressure may vary. Follow these instructions at home: Eating and drinking  If told, follow the DASH eating plan. To follow this plan: ? Fill one half of your plate at each meal with fruits and vegetables. ? Fill one fourth of your plate at each meal with whole grains. Whole grains include whole-wheat pasta, brown rice, and whole-grain bread. ? Eat or drink low-fat dairy products, such as skim milk or low-fat yogurt. ? Fill one fourth of your plate at each meal with low-fat (lean) proteins. Low-fat proteins include fish, chicken without skin, eggs, beans, and tofu. ? Avoid fatty meat, cured and processed meat, or chicken with skin. ? Avoid pre-made or processed food.  Eat less than 1,500 mg of salt each day.  Do not drink alcohol if: ? Your doctor tells you not to drink. ? You are pregnant, may be pregnant, or are planning to become pregnant.  If you drink alcohol: ? Limit how much you use to:  0-1 drink a day for women.  0-2 drinks a day for men. ? Be aware of how much alcohol is in your drink. In the [place] one drink equals one 12 oz bottle of beer (355 mL), one 5 oz glass of wine (148 mL), or one 1 oz glass of hard liquor (44 mL).   Lifestyle  Work with your doctor to stay at a  healthy weight or to lose weight. Ask your doctor what the best weight is for you.  Get at least 30 minutes of exercise most days of the week. This may include walking, swimming, or biking.  Get at least 30 minutes of exercise that strengthens your muscles (resistance exercise) at least 3 days a week. This may include lifting weights or doing Pilates.  Do not use any products that contain nicotine or tobacco, such as cigarettes, e-cigarettes, and chewing tobacco. If you need help quitting, ask your doctor.  Check your blood pressure at home as told by your  doctor.  Keep all follow-up visits as told by your doctor. This is important.   Medicines  Take over-the-counter and prescription medicines only as told by your doctor. Follow directions carefully.  Do not skip doses of blood pressure medicine. The medicine does not work as well if you skip doses. Skipping doses also puts you at risk for problems.  Ask your doctor about side effects or reactions to medicines that you should watch for. Contact a doctor if you:  Think you are having a reaction to the medicine you are taking.  Have headaches that keep coming back (recurring).  Feel dizzy.  Have swelling in your ankles.  Have trouble with your vision. Get help right away if you:  Get a very bad headache.  Start to feel mixed up (confused).  Feel weak or numb.  Feel faint.  Have very bad pain in your: ? Chest. ? Belly (abdomen).  Throw up more than once.  Have trouble breathing. Summary  Hypertension is another name for high blood pressure.  High blood pressure forces your heart to work harder to pump blood.  For most people, a normal blood pressure is less than 120/80.  Making healthy choices can help lower blood pressure. If your blood pressure does not get lower with healthy choices, you may need to take medicine. This information is not intended to replace advice given to you by your health care provider. Make sure you discuss any questions you have with your health care provider. Document Revised: 09/29/2017 Document Reviewed: 09/29/2017 Elsevier Patient Education  2021 [employer].

## 2020-02-14 ENCOUNTER — Other Ambulatory Visit: Payer: Self-pay

## 2020-02-14 ENCOUNTER — Telehealth: Payer: Self-pay

## 2020-02-14 DIAGNOSIS — E875 Hyperkalemia: Secondary | ICD-10-CM

## 2020-02-14 LAB — LIPID PANEL
Chol/HDL Ratio: 3.7 ratio (ref 0.0–4.4)
Cholesterol, Total: 225 mg/dL — ABNORMAL HIGH (ref 100–199)
HDL: 61 mg/dL (ref >39)
LDL Chol Calc (NIH): 143 mg/dL — ABNORMAL HIGH (ref 0–99)
Triglycerides: 120 mg/dL (ref 0–149)
VLDL Cholesterol Cal: 21 mg/dL (ref 5–40)

## 2020-02-14 LAB — COMPREHENSIVE METABOLIC PANEL
ALT: 12 IU/L (ref 0–32)
AST: 14 IU/L (ref 0–40)
Albumin/Globulin Ratio: 1.9 (ref 1.2–2.2)
Albumin: 4.4 g/dL (ref 3.8–4.8)
Alkaline Phosphatase: 89 IU/L (ref 44–121)
BUN/Creatinine Ratio: 16 (ref 12–28)
BUN: 13 mg/dL (ref 8–27)
Bilirubin Total: 0.5 mg/dL (ref 0.0–1.2)
CO2: 29 mmol/L (ref 20–29)
Calcium: 9.8 mg/dL (ref 8.7–10.3)
Chloride: 101 mmol/L (ref 96–106)
Creatinine, Ser: 0.83 mg/dL (ref 0.57–1.00)
GFR calc Af Amer: 85 mL/min/{1.73_m2} (ref >59)
GFR calc non Af Amer: 74 mL/min/{1.73_m2} (ref >59)
Globulin, Total: 2.3 g/dL (ref 1.5–4.5)
Glucose: 91 mg/dL (ref 65–99)
Potassium: 5.9 mmol/L — ABNORMAL HIGH (ref 3.5–5.2)
Sodium: 143 mmol/L (ref 134–144)
Total Protein: 6.7 g/dL (ref 6.0–8.5)

## 2020-02-14 LAB — CBC WITH DIFFERENTIAL/PLATELET
Basophils Absolute: 0 10*3/uL (ref 0.0–0.2)
Basos: 1 %
EOS (ABSOLUTE): 0.3 10*3/uL (ref 0.0–0.4)
Eos: 4 %
Hematocrit: 46.7 % — ABNORMAL HIGH (ref 34.0–46.6)
Hemoglobin: 15.2 g/dL (ref 11.1–15.9)
Immature Grans (Abs): 0 10*3/uL (ref 0.0–0.1)
Immature Granulocytes: 0 %
Lymphocytes Absolute: 2 10*3/uL (ref 0.7–3.1)
Lymphs: 33 %
MCH: 31.5 pg (ref 26.6–33.0)
MCHC: 32.5 g/dL (ref 31.5–35.7)
MCV: 97 fL (ref 79–97)
Monocytes Absolute: 0.4 10*3/uL (ref 0.1–0.9)
Monocytes: 7 %
Neutrophils Absolute: 3.3 10*3/uL (ref 1.4–7.0)
Neutrophils: 55 %
Platelets: 287 10*3/uL (ref 150–450)
RBC: 4.82 x10E6/uL (ref 3.77–5.28)
RDW: 12.3 % (ref 11.7–15.4)
WBC: 6.1 10*3/uL (ref 3.4–10.8)

## 2020-02-14 LAB — CARDIOVASCULAR RISK ASSESSMENT

## 2020-02-14 LAB — TSH: TSH: 2.44 u[IU]/mL (ref 0.450–4.500)

## 2020-02-14 NOTE — Telephone Encounter (Signed)
Helen West called to report that her bp was 160/117 last pm with pulse 77.  Her bp today is 147/109, pulse 87.  Dr. Tobie Poet advised that she continue the medication as prescribed yesterday and keep a log of her bp's for the next 2 weeks.  She was instructed to call us back if her bp does not continue to decline as expected.

## 2020-02-15 ENCOUNTER — Telehealth: Payer: Self-pay

## 2020-02-15 ENCOUNTER — Other Ambulatory Visit: Payer: Self-pay

## 2020-02-15 ENCOUNTER — Inpatient Hospital Stay: Payer: PPO | Attending: Gynecologic Oncology | Admitting: Gynecologic Oncology

## 2020-02-15 ENCOUNTER — Other Ambulatory Visit: Payer: PPO

## 2020-02-15 ENCOUNTER — Encounter: Payer: Self-pay | Admitting: Gynecologic Oncology

## 2020-02-15 VITALS — BP 157/100 | HR 65 | Temp 96.8°F | Resp 20 | Ht 62.0 in | Wt 265.0 lb

## 2020-02-15 DIAGNOSIS — Z90710 Acquired absence of both cervix and uterus: Secondary | ICD-10-CM | POA: Diagnosis not present

## 2020-02-15 DIAGNOSIS — E669 Obesity, unspecified: Secondary | ICD-10-CM | POA: Insufficient documentation

## 2020-02-15 DIAGNOSIS — Z8542 Personal history of malignant neoplasm of other parts of uterus: Secondary | ICD-10-CM | POA: Diagnosis not present

## 2020-02-15 DIAGNOSIS — Z923 Personal history of irradiation: Secondary | ICD-10-CM | POA: Insufficient documentation

## 2020-02-15 DIAGNOSIS — I10 Essential (primary) hypertension: Secondary | ICD-10-CM | POA: Insufficient documentation

## 2020-02-15 DIAGNOSIS — R350 Frequency of micturition: Secondary | ICD-10-CM | POA: Diagnosis not present

## 2020-02-15 DIAGNOSIS — Z7901 Long term (current) use of anticoagulants: Secondary | ICD-10-CM | POA: Diagnosis not present

## 2020-02-15 DIAGNOSIS — E875 Hyperkalemia: Secondary | ICD-10-CM

## 2020-02-15 DIAGNOSIS — Z08 Encounter for follow-up examination after completed treatment for malignant neoplasm: Secondary | ICD-10-CM | POA: Insufficient documentation

## 2020-02-15 DIAGNOSIS — C541 Malignant neoplasm of endometrium: Secondary | ICD-10-CM

## 2020-02-15 DIAGNOSIS — I4891 Unspecified atrial fibrillation: Secondary | ICD-10-CM | POA: Insufficient documentation

## 2020-02-15 DIAGNOSIS — E7800 Pure hypercholesterolemia, unspecified: Secondary | ICD-10-CM | POA: Insufficient documentation

## 2020-02-15 DIAGNOSIS — Z79899 Other long term (current) drug therapy: Secondary | ICD-10-CM | POA: Diagnosis not present

## 2020-02-15 DIAGNOSIS — Z90722 Acquired absence of ovaries, bilateral: Secondary | ICD-10-CM | POA: Insufficient documentation

## 2020-02-15 NOTE — Patient Instructions (Signed)
Please notify Dr Denman George at phone number [phone] if you notice vaginal bleeding, new pelvic or abdominal pains, bloating, feeling full easy, or a change in bladder or bowel function.    Please contact Dr Serita Grit office (at [phone]) in or after April, 2022 to request an appointment with her for July, 2022.

## 2020-02-15 NOTE — Telephone Encounter (Signed)
Left a message regarding appt on 02/19/20.

## 2020-02-15 NOTE — Progress Notes (Signed)
Follow-up Note: Gyn-Onc   Helen West 64 y.o. female  Chief Complaint  Patient presents with  . 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 S/p brachytherapy for high/intermediate risk factors completed 01/12/18.  No evidence of recurrence on examination  Plan: I recommend she follow-up in 6 months All questions were answered.  HPI: 65 year old white married female gravida 3 seen in consultation request of Dr. Paula Compton regarding management of complex atypical hyperplasia with papillary features.  The patient presented with postmenopausal bleeding and underwent an ultrasound showing an endometrial stripe of 21 mm.  An endometrial biopsy was obtained on October 13, 2017.  Obstetrical history gravida 3.  Patient had a postpartum tubal ligation following her second child.  She has had no other abdominal or pelvic surgery.  She has atrial fibrillation and is on Xarelto and metoprolol.  On 11/02/17 she underwent robotic assisted total hysterectomy, BSO, SLN biopsy. Final pathology revealed a 5.5cm grade 1 endometrioid adenocarcinoma with 16 of 65m myometrial invasion and LVSI present. Nodes,adnexa and cervix were negative. MMR normal/MSI stable.  Due to high/intermediate risk factors she was recommended to have adjuvant vaginal brachytherapy for local control.  She completed vaginal brachytherapy with with 5 fractions of 6Gy (30Gy total) between 12/16/17-01/12/18. She tolerated treatment well with no complaints or toxicity.  Interval Hx:  She has persistent hypertension requiring evaluation and treatment by her PCP. She has persistent hot flashes but is not a candidate for estrogen replacement due to her obesity and HTN. She was offered venlafaxine but declined this for management of vasomotor symptoms. She is sexually active with no concerns.   Review of Systems:10  point review of systems is negative except as noted in interval history.   Vitals: Blood pressure (!) 157/100, pulse 65, temperature (!) 96.8 F (36 C), temperature source Tympanic, resp. rate 20, height 5' 2"  (15.726m), weight 265 lb (120.2 kg), SpO2 100 %.  Physical Exam: General : The patient is a healthy woman in no acute distress.  HEENT: normocephalic, extraoccular movements normal; neck is supple without thyromegally  Lynphnodes: Supraclavicular and inguinal nodes not enlarged  Abdomen: Obese soft, non-tender, no ascites, no organomegally, no masses, no hernias  Pelvic:  Vaginal cuff normal smooth with no lesions, no bleeding, no masses Rectal: deferred Lower extremities: No edema or varicosities. Normal range of motion   No Known Allergies  Past Medical History:  Diagnosis Date  . [place]fib ([place]   . Anemia   . Arthritis   . Cancer ([place]   . Complication of anesthesia    Loss of vocal/voice x 2 months  . Dysrhythmia   . Headache   . High cholesterol   . Obesity   . Post-menopausal bleeding     Past Surgical History:  Procedure Laterality Date  . ROBOTIC ASSISTED TOTAL HYSTERECTOMY WITH BILATERAL SALPINGO OOPHERECTOMY Bilateral 11/02/2017   Procedure: XI ROBOTIC ASSISTED TOTAL HYSTERECTOMY WITH BILATERAL SALPINGO OOPHORECTOMY;  Surgeon: REveritt Amber MD;  [facility];  Service: Gynecology;  Laterality: Bilateral;  . SENTINEL NODE BIOPSY N/A 11/02/2017   Procedure: SENTINEL NODE BIOPSY;  Surgeon: REveritt Amber MD;  [facility];  Service: Gynecology;  Laterality: N/A;  . TONSILLECTOMY    . TOTAL HIP ARTHROPLASTY    . TUBAL LIGATION      Current Outpatient Medications  Medication Sig Dispense Refill  . acetaminophen (TYLENOL) 650 MG CR tablet Take  1,300 mg by mouth daily.    Marland Kitchen dronedarone (MULTAQ) 400 MG tablet Take 1 tablet (400 mg total) by mouth 2 (two) times daily with a meal. 1 tablet 0  . losartan (COZAAR) 50 MG tablet Take 1 tablet (50 mg total) by mouth  daily. 90 tablet 0  . metoprolol tartrate (LOPRESSOR) 25 MG tablet TAKE ONE TABLET TWICE DAILY 60 tablet 3  . XARELTO 20 MG TABS tablet TAKE ONE (1) TABLET ONCE DAILY WITH SUPPER 30 tablet 0   No current facility-administered medications for this visit.    Social History   Socioeconomic History  . Marital status: Married    Spouse name: Not on file  . Number of children: Not on file  . Years of education: Not on file  . Highest education level: Not on file  Occupational History  . Not on file  Tobacco Use  . Smoking status: Never Smoker  . Smokeless tobacco: Never Used  Vaping Use  . Vaping Use: Never used  Substance and Sexual Activity  . Alcohol use: Yes    Comment: occasional social  . Drug use: Never  . Sexual activity: Not on file  Other Topics Concern  . Not on file  Social History Narrative  . Not on file   Social Determinants of Health   Financial Resource Strain: Not on file  Food Insecurity: Not on file  Transportation Needs: Not on file  Physical Activity: Not on file  Stress: Not on file  Social Connections: Not on file  Intimate Partner Violence: Not on file    Family History  Problem Relation Age of Onset  . Aneurysm Mother   . Emphysema Father   . Cancer Paternal Uncle     Thereasa Solo, MD 02/15/2020, 2:36 PM          Follow-up Note: Gyn-Onc   Helen West 65 y.o. female  Chief Complaint  Patient presents with  . 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 S/p brachytherapy for high/intermediate risk factors completed 01/12/18.  No evidence of recurrence on examination  Plan:  I recommend she follow-up at 3 monthly intervals for symptom review, physical examination and pelvic examination. Pap smear is not recommended in routine endometrial cancer surveillance. After December, 2021 we will space these visits to every 6  months, and then annually if recurrence has not developed within 5 years. All questions were answered.  HPI: 65 year old white married female gravida 3 seen in consultation request of Dr. Paula Compton regarding management of complex atypical hyperplasia with papillary features.  The patient presented with postmenopausal bleeding and underwent an ultrasound showing an endometrial stripe of 21 mm.  An endometrial biopsy was obtained on October 13, 2017.  Obstetrical history gravida 3.  Patient had a postpartum tubal ligation following her second child.  She has had no other abdominal or pelvic surgery.  She has atrial fibrillation and is on Xarelto and metoprolol.  On 11/02/17 she underwent robotic assisted total hysterectomy, BSO, SLN biopsy. Final pathology revealed a 5.5cm grade 1 endometrioid adenocarcinoma with 16 of 35m myometrial invasion and LVSI present. Nodes,adnexa and cervix were negative. MMR normal/MSI stable.  Due to high/intermediate risk factors she was recommended to have adjuvant vaginal brachytherapy for local control.  She completed vaginal brachytherapy with with 5 fractions of 6Gy (30Gy total) between 12/16/17-01/12/18. She tolerated treatment well with no complaints or toxicity.  Interval Hx:  Since surgery she has noted some issues with urinary frequency particularly at night.    She is sexually active with no concerns.   Review of Systems:10 point review of systems is negative except as noted in interval history.   Vitals: Blood pressure (!) 157/100, pulse 65, temperature (!) 96.8 F (36 C), temperature source Tympanic, resp. rate 20, height 5' 2"  (1.575 m), weight 265 lb (120.2 kg), SpO2 100 %.  Physical Exam: General : The patient is a healthy woman in no acute distress.  HEENT: normocephalic, extraoccular movements normal; neck is supple without thyromegally  Lynphnodes: Supraclavicular and inguinal nodes not enlarged  Abdomen: Obese soft, non-tender, no  ascites, no organomegally, no masses, no hernias  Pelvic:  Vaginal cuff normal smooth with no lesions, no bleeding, no masses Rectal: deferred Lower extremities: No edema or varicosities. Normal range of motion   No Known Allergies  Past Medical History:  Diagnosis Date  . A-fib ([place])   . Anemia   . Arthritis   . Cancer ([place])   . Complication of anesthesia    Loss of vocal/voice x 2 months  . Dysrhythmia   . Headache   . High cholesterol   . Obesity   . Post-menopausal bleeding     Past Surgical History:  Procedure Laterality Date  . ROBOTIC ASSISTED TOTAL HYSTERECTOMY WITH BILATERAL SALPINGO OOPHERECTOMY Bilateral 11/02/2017   Procedure: XI ROBOTIC ASSISTED TOTAL HYSTERECTOMY WITH BILATERAL SALPINGO OOPHORECTOMY;  Surgeon: [name], MD;  [facility];  Service: Gynecology;  Laterality: Bilateral;  . SENTINEL NODE BIOPSY N/A 11/02/2017   Procedure: SENTINEL NODE BIOPSY;  Surgeon: [name], MD;  [facility];  Service: Gynecology;  Laterality: N/A;  . TONSILLECTOMY    . TOTAL HIP ARTHROPLASTY    . TUBAL LIGATION      Current Outpatient Medications  Medication Sig Dispense Refill  . acetaminophen (TYLENOL) 650 MG CR tablet Take 1,300 mg by mouth daily.    Marland Kitchen dronedarone (MULTAQ) 400 MG tablet Take 1 tablet (400 mg total) by mouth 2 (two) times daily with a meal. 1 tablet 0  . losartan (COZAAR) 50 MG tablet Take 1 tablet (50 mg total) by mouth daily. 90 tablet 0  . metoprolol tartrate (LOPRESSOR) 25 MG tablet TAKE ONE TABLET TWICE DAILY 60 tablet 3  . XARELTO 20 MG TABS tablet TAKE ONE (1) TABLET ONCE DAILY WITH SUPPER 30 tablet 0   No current facility-administered medications for this visit.    Social History   Socioeconomic History  . Marital status: Married    Spouse name: Not on file  . Number of children: Not on file  . Years of education: Not on file  . Highest education level: Not on file  Occupational History  . Not on file  Tobacco Use  .  Smoking status: Never Smoker  . Smokeless tobacco: Never Used  Vaping Use  . Vaping Use: Never used  Substance and Sexual Activity  . Alcohol use: Yes    Comment: occasional social  . Drug use: Never  . Sexual activity: Not on file  Other Topics Concern  . Not on file  Social History Narrative  . Not on file   Social Determinants of Health   Financial Resource Strain: Not on file  Food Insecurity: Not on file  Transportation Needs: Not on file  Physical Activity: Not on file  Stress: Not on file  Social Connections: Not on file  Intimate Partner Violence: Not on  file    Family History  Problem Relation Age of Onset  . Aneurysm Mother   . Emphysema Father   . Cancer Paternal Uncle     Thereasa Solo, MD 02/15/2020, 2:45 PM

## 2020-02-16 ENCOUNTER — Other Ambulatory Visit: Payer: Self-pay

## 2020-02-16 LAB — COMPREHENSIVE METABOLIC PANEL
ALT: 11 IU/L (ref 0–32)
AST: 12 IU/L (ref 0–40)
Albumin/Globulin Ratio: 1.6 (ref 1.2–2.2)
Albumin: 4.3 g/dL (ref 3.8–4.8)
Alkaline Phosphatase: 98 IU/L (ref 44–121)
BUN/Creatinine Ratio: 14 (ref 12–28)
BUN: 13 mg/dL (ref 8–27)
Bilirubin Total: 0.6 mg/dL (ref 0.0–1.2)
CO2: 28 mmol/L (ref 20–29)
Calcium: 9.4 mg/dL (ref 8.7–10.3)
Chloride: 101 mmol/L (ref 96–106)
Creatinine, Ser: 0.95 mg/dL (ref 0.57–1.00)
GFR calc Af Amer: 72 mL/min/{1.73_m2} (ref >59)
GFR calc non Af Amer: 63 mL/min/{1.73_m2} (ref >59)
Globulin, Total: 2.7 g/dL (ref 1.5–4.5)
Glucose: 99 mg/dL (ref 65–99)
Potassium: 5.6 mmol/L — ABNORMAL HIGH (ref 3.5–5.2)
Sodium: 142 mmol/L (ref 134–144)
Total Protein: 7 g/dL (ref 6.0–8.5)

## 2020-02-16 MED ORDER — ROSUVASTATIN CALCIUM 10 MG PO TABS: 10.0000 mg | ORAL_TABLET | Freq: Every day | ORAL | 0 refills | Status: DC

## 2020-02-19 ENCOUNTER — Telehealth: Payer: PPO | Admitting: Cardiology

## 2020-02-21 ENCOUNTER — Other Ambulatory Visit: Payer: Self-pay

## 2020-02-21 ENCOUNTER — Other Ambulatory Visit: Payer: Self-pay | Admitting: *Deleted

## 2020-02-21 ENCOUNTER — Telehealth: Payer: Self-pay

## 2020-02-21 DIAGNOSIS — I10 Essential (primary) hypertension: Secondary | ICD-10-CM

## 2020-02-21 MED ORDER — RIVAROXABAN 20 MG PO TABS: ORAL_TABLET | ORAL | 0 refills | Status: DC

## 2020-02-21 MED ORDER — LOSARTAN POTASSIUM 50 MG PO TABS: 100.0000 mg | ORAL_TABLET | Freq: Every day | ORAL | 0 refills | Status: DC

## 2020-02-21 NOTE — Telephone Encounter (Signed)
Xarelto 20mg  refill request received. Pt is 65 years old, weight-120.2kg, Crea-0.95 on 02/15/2020, last seen by Dr. Curt Bears on 04/10/2019 and pending appt 04/01/2020, Diagnosis-Afib, CrCl-110.51ml/min; Dose is appropriate based on dosing criteria. Will send in refill to requested pharmacy.

## 2020-02-21 NOTE — Telephone Encounter (Signed)
Helen West called to report that she has been taking the Losartan 50 mg daily but her bp remains high.  Her bp this morning is 189/132, pulse 88.  It has been ranging from 139/98 to 158/110.  She has been experiencing some headache.  Dr. Tobie Poet advised that she increase the losartan to 100 mg daily.

## 2020-03-11 NOTE — Progress Notes (Signed)
Subjective:  Patient ID: Helen West, female    DOB: Nov 30, 1955  Age: 65 y.o. MRN: [id number]  Chief Complaint  Patient presents with  . Hypertension    HPI HTN- Losartan 50 mg one twice a day, Metoprolol 25 mg one twice a day. Checking Bp four times a day. 127/87-158/113. Varies but the highest is around noon. Denies chest pain, sob. Has headache. Currently taking Liver Detox natural brand. Asking if this is safe to take.   Current Outpatient Medications on File Prior to Visit  Medication Sig Dispense Refill  . acetaminophen (TYLENOL) 650 MG CR tablet Take 1,300 mg by mouth daily.    Marland Kitchen dronedarone (MULTAQ) 400 MG tablet Take 1 tablet (400 mg total) by mouth 2 (two) times daily with a meal. 1 tablet 0  . losartan (COZAAR) 50 MG tablet Take 2 tablets (100 mg total) by mouth daily. 180 tablet 0  . metoprolol tartrate (LOPRESSOR) 25 MG tablet TAKE ONE TABLET TWICE DAILY 60 tablet 3  . rivaroxaban (XARELTO) 20 MG TABS tablet TAKE ONE (1) TABLET ONCE DAILY WITH SUPPER 90 tablet 0  . rosuvastatin (CRESTOR) 10 MG tablet Take 1 tablet (10 mg total) by mouth daily. 90 tablet 0   No current facility-administered medications on file prior to visit.   Past Medical History:  Diagnosis Date  . A-fib ([place])   . Anemia   . Arthritis   . Cancer ([place])   . Complication of anesthesia    Loss of vocal/voice x 2 months  . Dysrhythmia   . Headache   . High cholesterol   . Obesity   . Post-menopausal bleeding    Past Surgical History:  Procedure Laterality Date  . ROBOTIC ASSISTED TOTAL HYSTERECTOMY WITH BILATERAL SALPINGO OOPHERECTOMY Bilateral 11/02/2017   Procedure: XI ROBOTIC ASSISTED TOTAL HYSTERECTOMY WITH BILATERAL SALPINGO OOPHORECTOMY;  Surgeon: [name], MD;  [facility];  Service: Gynecology;  Laterality: Bilateral;  . SENTINEL NODE BIOPSY N/A 11/02/2017   Procedure: SENTINEL NODE BIOPSY;  Surgeon: [name], MD;  [facility];  Service: Gynecology;  Laterality: N/A;  .  TONSILLECTOMY    . TOTAL HIP ARTHROPLASTY    . TUBAL LIGATION      Family History  Problem Relation Age of Onset  . Aneurysm Mother   . Emphysema Father   . Cancer Paternal Uncle    Social History   Socioeconomic History  . Marital status: Married    Spouse name: Not on file  . Number of children: Not on file  . Years of education: Not on file  . Highest education level: Not on file  Occupational History  . Not on file  Tobacco Use  . Smoking status: Never Smoker  . Smokeless tobacco: Never Used  Vaping Use  . Vaping Use: Never used  Substance and Sexual Activity  . Alcohol use: Yes    Comment: occasional social  . Drug use: Never  . Sexual activity: Not on file  Other Topics Concern  . Not on file  Social History Narrative  . Not on file   Social Determinants of Health   Financial Resource Strain: Not on file  Food Insecurity: Not on file  Transportation Needs: Not on file  Physical Activity: Not on file  Stress: Not on file  Social Connections: Not on file    Review of Systems  Constitutional: Positive for fatigue. Negative for chills and fever.  HENT: Negative for congestion, ear pain, rhinorrhea and sore throat.  Respiratory: Negative for cough and shortness of breath.   Cardiovascular: Negative for chest pain and palpitations.  Gastrointestinal: Negative for abdominal pain, constipation, diarrhea, nausea and vomiting.  Genitourinary: Negative for dysuria and urgency.  Musculoskeletal: Positive for back pain (neck pain ) and neck pain. Negative for myalgias.  Neurological: Positive for light-headedness and headaches. Negative for dizziness and weakness.  Psychiatric/Behavioral: Positive for agitation and sleep disturbance (Insomnia. Goes to sleep okay, but then awakens in the middle of the night. ). Negative for dysphoric mood. The patient is not nervous/anxious.      Objective:  BP (!) 160/108   Pulse 72   Temp (!) 97.4 F (36.3 C)   Resp 18   Ht  5\' 2"  (1.575 m)   Wt 249 lb (112.9 kg)   BMI 45.54 kg/m   BP/Weight 03/13/2020 02/15/2020 0/62/3762  Systolic BP 831 517 616  Diastolic BP 073 710 98  Wt. (Lbs) 249 265 257  BMI 45.54 48.47 47.01    Physical Exam Vitals reviewed.  Constitutional:      Appearance: Normal appearance. She is normal weight.  Neck:     Vascular: No carotid bruit.  Cardiovascular:     Rate and Rhythm: Normal rate and regular rhythm.     Pulses: Normal pulses.     Heart sounds: Normal heart sounds.  Pulmonary:     Effort: Pulmonary effort is normal. No respiratory distress.     Breath sounds: Normal breath sounds.  Abdominal:     General: Abdomen is flat. Bowel sounds are normal.     Palpations: Abdomen is soft.     Tenderness: There is no abdominal tenderness.  Neurological:     Mental Status: She is alert and oriented to person, place, and time.  Psychiatric:        Mood and Affect: Mood normal.        Behavior: Behavior normal.     Diabetic Foot Exam - Simple   No data filed      Lab Results  Component Value Date   WBC 6.1 02/13/2020   HGB 15.2 02/13/2020   HCT 46.7 (H) 02/13/2020   PLT 287 02/13/2020   GLUCOSE 99 02/15/2020   CHOL 225 (H) 02/13/2020   TRIG 120 02/13/2020   HDL 61 02/13/2020   LDLCALC 143 (H) 02/13/2020   ALT 11 02/15/2020   AST 12 02/15/2020   NA 142 02/15/2020   K 5.6 (H) 02/15/2020   CL 101 02/15/2020   CREATININE 0.95 02/15/2020   BUN 13 02/15/2020   CO2 28 02/15/2020   TSH 2.440 02/13/2020      Assessment & Plan:   1. Primary hypertension Start on chlorthalidone 25 mg once daily  Continue losartan 100 mg once daily and metoprolol Bring Liver Detox to the office next time. Unable to recommend as I do no know what it is or what kind of interactions it might have.    Follow-up: Return in about 3 weeks (around 04/03/2020).  An After Visit Summary was printed and given to the patient.  [name], MD [facility] [phone]

## 2020-03-13 ENCOUNTER — Other Ambulatory Visit: Payer: Self-pay | Admitting: Family Medicine

## 2020-03-13 ENCOUNTER — Other Ambulatory Visit: Payer: Self-pay

## 2020-03-13 ENCOUNTER — Ambulatory Visit (INDEPENDENT_AMBULATORY_CARE_PROVIDER_SITE_OTHER): Payer: PPO | Admitting: Family Medicine

## 2020-03-13 VITALS — BP 160/108 | HR 72 | Temp 97.4°F | Resp 18 | Ht 62.0 in | Wt 249.0 lb

## 2020-03-13 DIAGNOSIS — M8589 Other specified disorders of bone density and structure, multiple sites: Secondary | ICD-10-CM | POA: Diagnosis not present

## 2020-03-13 DIAGNOSIS — Z1231 Encounter for screening mammogram for malignant neoplasm of breast: Secondary | ICD-10-CM | POA: Diagnosis not present

## 2020-03-13 DIAGNOSIS — Z780 Asymptomatic menopausal state: Secondary | ICD-10-CM

## 2020-03-13 DIAGNOSIS — Z13820 Encounter for screening for osteoporosis: Secondary | ICD-10-CM

## 2020-03-13 DIAGNOSIS — I10 Essential (primary) hypertension: Secondary | ICD-10-CM

## 2020-03-13 DIAGNOSIS — M8580 Other specified disorders of bone density and structure, unspecified site: Secondary | ICD-10-CM | POA: Diagnosis not present

## 2020-03-13 LAB — HM DEXA SCAN

## 2020-03-13 MED ORDER — CHLORTHALIDONE 25 MG PO TABS: 25.0000 mg | ORAL_TABLET | Freq: Every day | ORAL | 2 refills | Status: DC

## 2020-03-13 NOTE — Patient Instructions (Signed)
Start on chlorthalidone 25 mg once daily  Continue losartan 100 mg once daily  Bring Liver Detox to the office next time.

## 2020-03-20 ENCOUNTER — Encounter: Payer: Self-pay | Admitting: Family Medicine

## 2020-03-25 ENCOUNTER — Encounter: Payer: Self-pay | Admitting: Family Medicine

## 2020-03-31 NOTE — Progress Notes (Signed)
Electrophysiology Office Note   Date:  04/01/2020   ID:  Helen West, Helen West 1956-01-14, MRN [id number]  PCP:  [name], MD  Cardiologist:  Debara Pickett Primary Electrophysiologist:  Kenric Ginger Meredith Leeds, MD    Chief Complaint: palpitations   History of Present Illness: Helen West is a 65 y.o. female who is being seen today for the evaluation of atrial fibrillation at the request of [name], [name], MD. Presenting today for electrophysiology evaluation.  She has a history of atrial fibrillation, hyperlipidemia, and obesity. She had continued episodes of atrial fibrillation despite use of flecainide. She was a started on Multaq.  Today, denies symptoms of palpitations, chest pain, shortness of breath, orthopnea, PND, lower extremity edema, claudication, dizziness, presyncope, syncope, bleeding, or neurologic sequela. The patient is tolerating medications without difficulties.  She has been having weakness, fatigue, shortness of breath, and headaches.  She is surprised to learn that she is in atrial fibrillation today.  She has also been dealing with blood pressure issues.  Her blood pressure today is well controlled, though she is on quite a few medications.  She has felt fatigued for the last few weeks.   Past Medical History:  Diagnosis Date  . A-fib ([place])   . Anemia   . Arthritis   . Cancer ([place])   . Complication of anesthesia    Loss of vocal/voice x 2 months  . Dysrhythmia   . Headache   . High cholesterol   . Obesity   . Post-menopausal bleeding    Past Surgical History:  Procedure Laterality Date  . ROBOTIC ASSISTED TOTAL HYSTERECTOMY WITH BILATERAL SALPINGO OOPHERECTOMY Bilateral 11/02/2017   Procedure: XI ROBOTIC ASSISTED TOTAL HYSTERECTOMY WITH BILATERAL SALPINGO OOPHORECTOMY;  Surgeon: [name], MD;  [facility];  Service: Gynecology;  Laterality: Bilateral;  . SENTINEL NODE BIOPSY N/A 11/02/2017   Procedure: SENTINEL NODE BIOPSY;  Surgeon: [name], MD;   [facility];  Service: Gynecology;  Laterality: N/A;  . TONSILLECTOMY    . TOTAL HIP ARTHROPLASTY    . TUBAL LIGATION       Current Outpatient Medications  Medication Sig Dispense Refill  . acetaminophen (TYLENOL) 650 MG CR tablet Take 1,300 mg by mouth daily.    . chlorthalidone (HYGROTON) 25 MG tablet Take 1 tablet (25 mg total) by mouth daily. 30 tablet 2  . dronedarone (MULTAQ) 400 MG tablet Take 1 tablet (400 mg total) by mouth 2 (two) times daily with a meal. 1 tablet 0  . losartan (COZAAR) 50 MG tablet Take 2 tablets (100 mg total) by mouth daily. 180 tablet 0  . metoprolol tartrate (LOPRESSOR) 25 MG tablet TAKE ONE TABLET TWICE DAILY 60 tablet 3  . rivaroxaban (XARELTO) 20 MG TABS tablet TAKE ONE (1) TABLET ONCE DAILY WITH SUPPER 90 tablet 0  . rosuvastatin (CRESTOR) 10 MG tablet Take 1 tablet (10 mg total) by mouth daily. 90 tablet 0   No current facility-administered medications for this visit.    Allergies:   Patient has no known allergies.   Social History:  The patient  reports that she has never smoked. She has never used smokeless tobacco. She reports current alcohol use. She reports that she does not use drugs.   Family History:  The patient's family history includes Aneurysm in her mother; Cancer in her paternal uncle; Emphysema in her father.    ROS:  Please see the history of present illness.   Otherwise, review of systems is positive for none.  All other systems are reviewed and negative.   PHYSICAL EXAM: VS:  BP 128/68   Pulse 74   Ht 5\' 2"  (1.575 m)   Wt 264 lb (119.7 kg)   SpO2 99%   BMI 48.29 kg/m  , BMI Body mass index is 48.29 kg/m. GEN: Well nourished, well developed, in no acute distress, obese HEENT: normal  Neck: no JVD, carotid bruits, or masses Cardiac: Irregular; no murmurs, rubs, or gallops,no edema  Respiratory:  clear to auscultation bilaterally, normal work of breathing GI: soft, nontender, nondistended, + BS MS: no deformity or  atrophy  Skin: warm and dry Neuro:  Strength and sensation are intact Psych: euthymic mood, full affect  EKG:  EKG is ordered today. Personal review of the ekg ordered shows atrial fibrillation, rate 74  Recent Labs: 02/13/2020: Hemoglobin 15.2; Platelets 287; TSH 2.440 02/15/2020: ALT 11; BUN 13; Creatinine, Ser 0.95; Potassium 5.6; Sodium 142    Lipid Panel     Component Value Date/Time   CHOL 225 (H) 02/13/2020 1000   TRIG 120 02/13/2020 1000   HDL 61 02/13/2020 1000   CHOLHDL 3.7 02/13/2020 1000   LDLCALC 143 (H) 02/13/2020 1000     Wt Readings from Last 3 Encounters:  04/01/20 264 lb (119.7 kg)  03/13/20 249 lb (112.9 kg)  02/15/20 265 lb (120.2 kg)      Other studies Reviewed: Additional studies/ records that were reviewed today include: Monitor 01/17/19  Review of the above records today demonstrates:  Monitor shows PAF/flutter with possible aberrancy vs. NSVT (up to 12 beats).   ASSESSMENT AND PLAN:  1.  Persistent atrial fibrillation: Currently on Multaq and Xarelto. High risk medication monitoring. CHA2DS2-VASc of 2.  She unfortunately is in atrial fibrillation today and feeling weak and fatigued.  She would likely benefit from resumption of sinus rhythm.  We Helen West stop her Multaq today as it appears to be failing.  I spoke with her about the possibility of ablation versus Tikosyn.  She Helen West call us back with an answer.   2. Hypertension: Currently well controlled  3.  Snoring, obesity, headaches, daytime fatigue: All symptoms of obstructive sleep apnea.  We Helen West plan for sleep study.   Current medicines are reviewed at length with the patient today.   The patient does not have concerns regarding her medicines.  The following changes were made today: Stop Multaq  Labs/ tests ordered today include:  Orders Placed This Encounter  Procedures  . EKG 12-Lead    Disposition:   FU with Helen West 3 months  Signed, Helen Peary Meredith Leeds, MD  04/01/2020 9:51  AM     [facility] [address] [place] [address] [phone] (office) [phone] (fax)

## 2020-04-01 ENCOUNTER — Encounter: Payer: Self-pay | Admitting: Cardiology

## 2020-04-01 ENCOUNTER — Ambulatory Visit: Payer: PPO | Admitting: Cardiology

## 2020-04-01 ENCOUNTER — Telehealth: Payer: Self-pay | Admitting: *Deleted

## 2020-04-01 ENCOUNTER — Other Ambulatory Visit: Payer: Self-pay

## 2020-04-01 VITALS — BP 128/68 | HR 74 | Ht 62.0 in | Wt 264.0 lb

## 2020-04-01 DIAGNOSIS — R400 Somnolence: Secondary | ICD-10-CM

## 2020-04-01 DIAGNOSIS — R5383 Other fatigue: Secondary | ICD-10-CM | POA: Diagnosis not present

## 2020-04-01 DIAGNOSIS — I4819 Other persistent atrial fibrillation: Secondary | ICD-10-CM

## 2020-04-01 DIAGNOSIS — R0683 Snoring: Secondary | ICD-10-CM

## 2020-04-01 NOTE — Patient Instructions (Signed)
Medication Instructions:  Your physician has recommended you make the following change in your medication:  1. STOP Multaq  *If you need a refill on your cardiac medications before your next appointment, please call your pharmacy*   Lab Work: None ordered   Testing/Procedures: Your physician has recommended that you have a sleep study. This test records several body functions during sleep, including: brain activity, eye movement, oxygen and carbon dioxide blood levels, heart rate and rhythm, breathing rate and rhythm, the flow of air through your mouth and nose, snoring, body muscle movements, and chest and belly movement.  The office will contact you to arrange this testing   Follow-Up: At [facility], you and your health needs are our priority.  As part of our continuing mission to provide you with exceptional heart care, we have created designated Provider Care Teams.  These Care Teams include your primary Cardiologist (physician) and Advanced Practice Providers (APPs -  Physician Assistants and Nurse Practitioners) who all work together to provide you with the care you need, when you need it.  We recommend signing up for the patient portal called "MyChart".  Sign up information is provided on this After Visit Summary.  MyChart is used to connect with patients for Virtual Visits (Telemedicine).  Patients are able to view lab/test results, encounter notes, upcoming appointments, etc.  Non-urgent messages can be sent to your provider as well.   To learn more about what you can do with MyChart, go to [email or web address].    Your next appointment:   3 month(s)  The format for your next appointment:   In Person  Provider:   Allegra Lai, MD    Thank you for choosing [facility]!!   Trinidad Curet, RN [phone]   Other Instructions    Dofetilide capsules What is this medicine? DOFETILIDE (doe FET il ide) is an antiarrhythmic drug. It helps make your heart beat  regularly. This medicine also helps to slow rapid heartbeats. This medicine may be used for other purposes; ask your health care provider or pharmacist if you have questions. COMMON BRAND NAME(S): Tikosyn What should I tell my health care provider before I take this medicine? They need to know if you have any of these conditions:  heart disease  history of irregular heartbeat  history of low levels of potassium or magnesium in the blood  kidney disease  liver disease  an unusual or allergic reaction to dofetilide, other medicines, foods, dyes, or preservatives  pregnant or trying to get pregnant  breast-feeding How should I use this medicine? Take this medicine by mouth with a glass of water. Follow the directions on the prescription label. Do not take with grapefruit juice. You can take it with or without food. If it upsets your stomach, take it with food. Take your medicine at regular intervals. Do not take it more often than directed. Do not stop taking except on your doctor's advice. A special MedGuide will be given to you by the pharmacist with each prescription and refill. Be sure to read this information carefully each time. Talk to your pediatrician regarding the use of this medicine in children. Special care may be needed. Overdosage: If you think you have taken too much of this medicine contact a poison control center or emergency room at once. NOTE: This medicine is only for you. Do not share this medicine with others. What if I miss a dose? If you miss a dose, skip it. Take your next dose at the  normal time. Do not take extra or 2 doses at the same time to make up for the missed dose. What may interact with this medicine? Do not take this medicine with any of the following  medications:  cimetidine  cisapride  dolutegravir  dronedarone  erdafitinib  hydrochlorothiazide  ketoconazole  megestrol  pimozide  prochlorperazine  thioridazine  trimethoprim  verapamil This medicine may also interact with the following medications:  amiloride  cannabinoids  certain antibiotics like erythromycin or clarithromycin  certain antiviral medicines for HIV or hepatitis  certain medicines for depression, anxiety, or psychotic disorders  digoxin  diltiazem  grapefruit juice  metformin  nefazodone  other medicines that prolong the QT interval (an abnormal heart rhythm)  quinine  triamterene  zafirlukast  ziprasidone This list may not describe all possible interactions. Give your health care provider a list of all the medicines, herbs, non-prescription drugs, or dietary supplements you use. Also tell them if you smoke, drink alcohol, or use illegal drugs. Some items may interact with your medicine. What should I watch for while using this medicine? Your condition will be monitored carefully while you are receiving this medicine. What side effects may I notice from receiving this medicine? Side effects that you should report to your doctor or health care professional as soon as possible:  allergic reactions like skin rash, itching or hives, swelling of the face, lips, or tongue  breathing problems  chest pain or chest tightness  dizziness  signs and symptoms of a dangerous change in heartbeat or heart rhythm like chest pain; dizziness; fast or irregular heartbeat; palpitations; feeling faint or lightheaded, falls; breathing problems  signs and symptoms of electrolyte imbalance like severe diarrhea, unusual sweating, vomiting, loss of appetite, increased thirst  swelling of the ankles, legs, or feet  tingling, numbness in the hands or feet Side effects that usually do not require medical attention (report to your doctor or health  care professional if they continue or are bothersome):  diarrhea  general ill feeling or flu-like symptoms  headache  nausea  trouble sleeping  stomach pain This list may not describe all possible side effects. Call your doctor for medical advice about side effects. You may report side effects to FDA at 1-800-FDA-1088. Where should I keep my medicine? Keep out of the reach of children. Store at room temperature between 15 and 30 degrees C (59 and 86 degrees F). Throw away any unused medicine after the expiration date. NOTE: This sheet is a summary. It may not cover all possible information. If you have questions about this medicine, talk to your doctor, pharmacist, or health care provider.  2021 Elsevier/Gold Standard (2018-12-20 12:14:05)    Cardiac Ablation Cardiac ablation is a procedure to destroy (ablate) some heart tissue that is sending bad signals. These bad signals cause problems in heart rhythm. The heart has many areas that make these signals. If there are problems in these areas, they can make the heart beat in a way that is not normal. Destroying some tissues can help make the heart rhythm normal. Tell your doctor about:  Any allergies you have.  All medicines you are taking. These include vitamins, herbs, eye drops, creams, and over-the-counter medicines.  Any problems you or family members have had with medicines that make you fall asleep (anesthetics).  Any blood disorders you have.  Any surgeries you have had.  Any medical conditions you have, such as kidney failure.  Whether you are pregnant or may be pregnant. What  are the risks? This is a safe procedure. But problems may occur, including:  Infection.  Bruising and bleeding.  Bleeding into the chest.  Stroke or blood clots.  Damage to nearby areas of your body.  Allergies to medicines or dyes.  The need for a pacemaker if the normal system is damaged.  Failure of the procedure to treat the  problem. What happens before the procedure? Medicines Ask your doctor about:  Changing or stopping your normal medicines. This is important.  Taking aspirin and ibuprofen. Do not take these medicines unless your doctor tells you to take them.  Taking other medicines, vitamins, herbs, and supplements. General instructions  Follow instructions from your doctor about what you cannot eat or drink.  Plan to have someone take you home from the hospital or clinic.  If you will be going home right after the procedure, plan to have someone with you for 24 hours.  Ask your doctor what steps will be taken to prevent infection. What happens during the procedure?  An IV tube will be put into one of your veins.  You will be given a medicine to help you relax.  The skin on your neck or groin will be numbed.  A cut (incision) will be made in your neck or groin. A needle will be put through your cut and into a large vein.  A tube (catheter) will be put into the needle. The tube will be moved to your heart.  Dye may be put through the tube. This helps your doctor see your heart.  Small devices (electrodes) on the tube will send out signals.  A type of energy will be used to destroy some heart tissue.  The tube will be taken out.  Pressure will be held on your cut. This helps stop bleeding.  A bandage will be put over your cut. The exact procedure may vary among doctors and hospitals.   What happens after the procedure?  You will be watched until you leave the hospital or clinic. This includes checking your heart rate, breathing rate, oxygen, and blood pressure.  Your cut will be watched for bleeding. You will need to lie still for a few hours.  Do not drive for 24 hours or as long as your doctor tells you. Summary  Cardiac ablation is a procedure to destroy some heart tissue. This is done to treat heart rhythm problems.  Tell your doctor about any medical conditions you may have.  Tell him or her about all medicines you are taking to treat them.  This is a safe procedure. But problems may occur. These include infection, bruising, bleeding, and damage to nearby areas of your body.  Follow what your doctor tells you about food and drink. You may also be told to change or stop some of your medicines.  After the procedure, do not drive for 24 hours or as long as your doctor tells you. This information is not intended to replace advice given to you by your health care provider. Make sure you discuss any questions you have with your health care provider. Document Revised: 12/22/2018 Document Reviewed: 12/22/2018 Elsevier Patient Education  2021 [employer].

## 2020-04-01 NOTE — Telephone Encounter (Signed)
PA for sleep study faxed to [facility].

## 2020-04-02 NOTE — Progress Notes (Signed)
Subjective:  Patient ID: Helen West, female    DOB: 1956-01-28  Age: 65 y.o. MRN: [id number]  Chief Complaint  Patient presents with  . Hypertension     HPI Hypertension-started on chlorthalidone 25 mg once daily during last office visit and is to continue losartan 100 mg once daily and metoprolol. Bps 120s/70s  Atrial fibrillation: Pt saw Dr. Curt Bears stopped multaq since Monday. He has recommended she have the cardiac ablation. He ordered sleep study.  Current Outpatient Medications on File Prior to Visit  Medication Sig Dispense Refill  . acetaminophen (TYLENOL) 650 MG CR tablet Take 1,300 mg by mouth daily.    . rivaroxaban (XARELTO) 20 MG TABS tablet TAKE ONE (1) TABLET ONCE DAILY WITH SUPPER 90 tablet 0  . rosuvastatin (CRESTOR) 10 MG tablet Take 1 tablet (10 mg total) by mouth daily. 90 tablet 0   No current facility-administered medications on file prior to visit.   Past Medical History:  Diagnosis Date  . A-fib ([place])   . Anemia   . Arthritis   . Cancer ([place])   . Complication of anesthesia    Loss of vocal/voice x 2 months  . Dysrhythmia   . Headache   . High cholesterol   . Obesity   . Post-menopausal bleeding    Past Surgical History:  Procedure Laterality Date  . ROBOTIC ASSISTED TOTAL HYSTERECTOMY WITH BILATERAL SALPINGO OOPHERECTOMY Bilateral 11/02/2017   Procedure: XI ROBOTIC ASSISTED TOTAL HYSTERECTOMY WITH BILATERAL SALPINGO OOPHORECTOMY;  Surgeon: [name], MD;  Location: [facility];  Service: Gynecology;  Laterality: Bilateral;  . SENTINEL NODE BIOPSY N/A 11/02/2017   Procedure: SENTINEL NODE BIOPSY;  Surgeon: [name], MD;  Location: [facility];  Service: Gynecology;  Laterality: N/A;  . TONSILLECTOMY    . TOTAL HIP ARTHROPLASTY    . TUBAL LIGATION      Family History  Problem Relation Age of Onset  . Aneurysm Mother   . Emphysema Father   . Cancer Paternal Uncle    Social History   Socioeconomic History  . Marital status: Married    Spouse  name: Not on file  . Number of children: Not on file  . Years of education: Not on file  . Highest education level: Not on file  Occupational History  . Not on file  Tobacco Use  . Smoking status: Never Smoker  . Smokeless tobacco: Never Used  Vaping Use  . Vaping Use: Never used  Substance and Sexual Activity  . Alcohol use: Yes    Comment: occasional social  . Drug use: Never  . Sexual activity: Not on file  Other Topics Concern  . Not on file  Social History Narrative  . Not on file   Social Determinants of Health   Financial Resource Strain: Not on file  Food Insecurity: Not on file  Transportation Needs: Not on file  Physical Activity: Not on file  Stress: Not on file  Social Connections: Not on file    Review of Systems  Constitutional: Positive for fatigue. Negative for chills and fever.  HENT: Negative for congestion, ear pain, rhinorrhea and sore throat.   Respiratory: Positive for shortness of breath. Negative for cough.   Cardiovascular: Negative for chest pain.  Gastrointestinal: Negative for abdominal pain, constipation, diarrhea, nausea and vomiting.  Genitourinary: Negative for dysuria and urgency.  Musculoskeletal: Negative for back pain and myalgias.  Neurological: Positive for weakness and headaches. Negative for dizziness and light-headedness.  Psychiatric/Behavioral: Negative for dysphoric mood.  The patient is not nervous/anxious.      Objective:  BP 140/78   Pulse 88   Temp (!) 97.4 F (36.3 C)   Resp 18   Ht 5\' 2"  (1.575 m)   Wt 252 lb (114.3 kg)   BMI 46.09 kg/m   BP/Weight 04/03/2020 9/70/2637 09/06/8848  Systolic BP 277 412 878  Diastolic BP 78 68 676  Wt. (Lbs) 252 264 249  BMI 46.09 48.29 45.54    Physical Exam Vitals reviewed.  Constitutional:      Appearance: Normal appearance. She is normal weight.  Neck:     Vascular: No carotid bruit.  Cardiovascular:     Rate and Rhythm: Normal rate and regular rhythm.     Pulses:  Normal pulses.     Heart sounds: Normal heart sounds.  Pulmonary:     Effort: Pulmonary effort is normal. No respiratory distress.     Breath sounds: Normal breath sounds.  Abdominal:     General: Abdomen is flat. Bowel sounds are normal.     Palpations: Abdomen is soft.     Tenderness: There is no abdominal tenderness.  Neurological:     Mental Status: She is alert and oriented to person, place, and time.  Psychiatric:        Mood and Affect: Mood normal.        Behavior: Behavior normal.     Diabetic Foot Exam - Simple   No data filed      Lab Results  Component Value Date   WBC 6.1 02/13/2020   HGB 15.2 02/13/2020   HCT 46.7 (H) 02/13/2020   PLT 287 02/13/2020   GLUCOSE 99 02/15/2020   CHOL 225 (H) 02/13/2020   TRIG 120 02/13/2020   HDL 61 02/13/2020   LDLCALC 143 (H) 02/13/2020   ALT 11 02/15/2020   AST 12 02/15/2020   NA 142 02/15/2020   K 5.6 (H) 02/15/2020   CL 101 02/15/2020   CREATININE 0.95 02/15/2020   BUN 13 02/15/2020   CO2 28 02/15/2020   TSH 2.440 02/13/2020      Assessment & Plan:   1. Primary hypertension The current medical regimen is effective;  continue present plan and medications.  2. Longstanding persistent atrial fibrillation ([place])  The current medical regimen is effective;  continue present plan and medications.   Meds ordered this encounter  Medications  . chlorthalidone (HYGROTON) 25 MG tablet    Sig: Take 1 tablet (25 mg total) by mouth daily.    Dispense:  90 tablet    Refill:  1  . losartan (COZAAR) 100 MG tablet    Sig: Take 1 tablet (100 mg total) by mouth daily.    Dispense:  90 tablet    Refill:  1  . metoprolol tartrate (LOPRESSOR) 25 MG tablet    Sig: Take 1 tablet (25 mg total) by mouth 2 (two) times daily.    Dispense:  180 tablet    Refill:  1    Follow-up: Return in about 3 months (around 07/04/2020).  An After Visit Summary was printed and given to the patient.  [name], MD [facility] [phone]

## 2020-04-03 ENCOUNTER — Other Ambulatory Visit: Payer: Self-pay

## 2020-04-03 ENCOUNTER — Ambulatory Visit (INDEPENDENT_AMBULATORY_CARE_PROVIDER_SITE_OTHER): Payer: PPO | Admitting: Family Medicine

## 2020-04-03 ENCOUNTER — Telehealth: Payer: Self-pay | Admitting: Cardiology

## 2020-04-03 VITALS — BP 140/78 | HR 88 | Temp 97.4°F | Resp 18 | Ht 62.0 in | Wt 252.0 lb

## 2020-04-03 DIAGNOSIS — Z01812 Encounter for preprocedural laboratory examination: Secondary | ICD-10-CM

## 2020-04-03 DIAGNOSIS — I4819 Other persistent atrial fibrillation: Secondary | ICD-10-CM

## 2020-04-03 DIAGNOSIS — I4811 Longstanding persistent atrial fibrillation: Secondary | ICD-10-CM

## 2020-04-03 DIAGNOSIS — I10 Essential (primary) hypertension: Secondary | ICD-10-CM | POA: Diagnosis not present

## 2020-04-03 MED ORDER — CHLORTHALIDONE 25 MG PO TABS: 25.0000 mg | ORAL_TABLET | Freq: Every day | ORAL | 1 refills | Status: DC

## 2020-04-03 MED ORDER — LOSARTAN POTASSIUM 100 MG PO TABS
100.0000 mg | ORAL_TABLET | Freq: Every day | ORAL | 1 refills | Status: DC
Start: 2020-04-03 — End: 2021-01-06

## 2020-04-03 MED ORDER — METOPROLOL TARTRATE 25 MG PO TABS: 25.0000 mg | ORAL_TABLET | Freq: Two times a day (BID) | ORAL | 1 refills | Status: DC

## 2020-04-03 NOTE — Telephone Encounter (Signed)
Returned call to patient who states she was seen by Dr. Curt Bears on Monday 2/28 and would like to come back in to see Dr. Curt Bears to discuss a fib ablation. She states she would like her husband to attend the appointment with her. I advised her that Dr. Macky Lower scheduler will be back in touch with her to schedule an appointment. She verbalized understanding and agreement with plan.   In the message received by triage, it states she wants to come in to talk with Dr. Marlou Porch - this is a mistype.  She thanked me for the call.

## 2020-04-03 NOTE — Telephone Encounter (Signed)
New Message:         Pt says she wants to know if she can schedule an appt for her and her husband to come and talk with Dr Marlou Porch about getting an Ablation.

## 2020-04-05 ENCOUNTER — Telehealth: Payer: Self-pay | Admitting: *Deleted

## 2020-04-05 NOTE — Telephone Encounter (Signed)
Was able to answer all questions pt and husband had.   General anesthesia, if husband can be there and a few others. Aware I will be in touch to schedule procedure as I may still have some dates available in April but need to speak w/ a few pts prior to knowing. Pt is agreeable to plan.

## 2020-04-05 NOTE — Telephone Encounter (Signed)
Staff message sent to Gae Bon ok to schedule sleep study. [facility] auth received. Auth # [id number]. Valid dates 05/02/20 to 07/31/20.

## 2020-04-11 ENCOUNTER — Encounter: Payer: Self-pay | Admitting: Family Medicine

## 2020-04-18 ENCOUNTER — Telehealth: Payer: Self-pay | Admitting: *Deleted

## 2020-04-18 NOTE — Telephone Encounter (Signed)
-----   Message from Lauralee Evener, [place] sent at 04/05/2020 10:47 AM EST ----- Regarding: RE: sleep study ordered Ok to schedule sleep study. [facility] Auth received. Auth # [id number]. Valid dates 05/02/20 to 07/31/20. ----- Message ----- From: Stanton Kidney, RN Sent: 04/01/2020  11:01 AM EST To: [facility] Sleep Studies Subject: sleep study ordered                            sleep study ordered Snores, daytime somnolence, afib [place]

## 2020-04-18 NOTE — Telephone Encounter (Signed)
Patient is scheduled for lab study on 06/14/20. Patient understands her sleep study will be done at [facility] sleep lab. Patient understands she will receive a sleep packet in a week or so. Patient understands to call if she does not receive the sleep packet in a timely manner. Patient agrees with treatment and thanked me for call.

## 2020-04-25 ENCOUNTER — Encounter: Payer: Self-pay | Admitting: *Deleted

## 2020-04-25 NOTE — Telephone Encounter (Signed)
Pt would like to go 4/13 for ablation. Covid screening arranged for 4/11. Pt will stop by the [facility] office tomorrow for blood work and letter of instructions.  CT & ablation instructions reviewed w/ pt. Aware office will call to arrange CT and post procedure follow up. Patient verbalized understanding and agreeable to plan.

## 2020-04-26 DIAGNOSIS — Z01812 Encounter for preprocedural laboratory examination: Secondary | ICD-10-CM | POA: Diagnosis not present

## 2020-04-26 DIAGNOSIS — I4819 Other persistent atrial fibrillation: Secondary | ICD-10-CM | POA: Diagnosis not present

## 2020-04-26 LAB — BASIC METABOLIC PANEL
BUN/Creatinine Ratio: 22 (ref 12–28)
BUN: 18 mg/dL (ref 8–27)
CO2: 27 mmol/L (ref 20–29)
Calcium: 9.6 mg/dL (ref 8.7–10.3)
Chloride: 99 mmol/L (ref 96–106)
Creatinine, Ser: 0.81 mg/dL (ref 0.57–1.00)
Glucose: 99 mg/dL (ref 65–99)
Potassium: 4.7 mmol/L (ref 3.5–5.2)
Sodium: 143 mmol/L (ref 134–144)
eGFR: 80 mL/min/{1.73_m2} (ref >59)

## 2020-04-26 LAB — CBC
Hematocrit: 45.4 % (ref 34.0–46.6)
Hemoglobin: 14.9 g/dL (ref 11.1–15.9)
MCH: 31 pg (ref 26.6–33.0)
MCHC: 32.8 g/dL (ref 31.5–35.7)
MCV: 95 fL (ref 79–97)
Platelets: 260 10*3/uL (ref 150–450)
RBC: 4.8 x10E6/uL (ref 3.77–5.28)
RDW: 12.6 % (ref 11.7–15.4)
WBC: 5.1 10*3/uL (ref 3.4–10.8)

## 2020-05-06 ENCOUNTER — Telehealth (HOSPITAL_COMMUNITY): Payer: Self-pay | Admitting: Emergency Medicine

## 2020-05-06 NOTE — Telephone Encounter (Signed)
Reaching out to patient to offer assistance regarding upcoming cardiac imaging study; pt verbalizes understanding of appt date/time, parking situation and where to check in, pre-test NPO status and medications ordered, and verified current allergies; name and call back number provided for further questions should they arise Helen Bond RN Navigator Cardiac [facility] [phone] office [phone] cell   Pt instructed to take additional metoprolol tartrate  Clarise Cruz

## 2020-05-08 ENCOUNTER — Ambulatory Visit (HOSPITAL_COMMUNITY)
Admission: RE | Admit: 2020-05-08 | Discharge: 2020-05-08 | Disposition: A | Discharge status: Home / Self Care | Payer: PPO | Source: Ambulatory Visit | Attending: Cardiology | Admitting: Cardiology

## 2020-05-08 ENCOUNTER — Other Ambulatory Visit: Payer: Self-pay

## 2020-05-08 DIAGNOSIS — I4819 Other persistent atrial fibrillation: Secondary | ICD-10-CM | POA: Insufficient documentation

## 2020-05-08 IMAGING — CT CT HEART MORPH/PULM VEIN W/ CM & W/O CA SCORE
2 of 6 series · 12 of 20 positions shown, 14 images · IV contrast (Omni 300)
Comparison: None.
COMPARISON: None.

Addendum:
EXAM:
OVER-READ INTERPRETATION  CT CHEST

The following report is an over-read performed by radiologist Dr.
Judson Zachariah [REDACTED] on 05/08/2020. This
over-read does not include interpretation of cardiac or coronary
anatomy or pathology. The coronary calcium score/coronary CTA
interpretation by the cardiologist is attached.
CLINICAL DATA: Atrial fibrillation scheduled for an ablation.
Cardiac CT/CTA
TECHNIQUE: The patient was scanned on a Siemens Somatom scanner.

[Series 8: 25-50% · axial · 0.36mm/px · z∈[+1102,+1209]mm · 8 of 1380 slices shown, 10 images]
[im 154/1380  vessel]
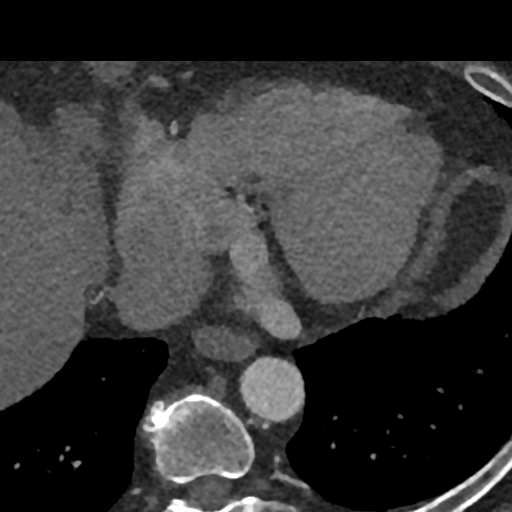
[im 154/1380  lung]
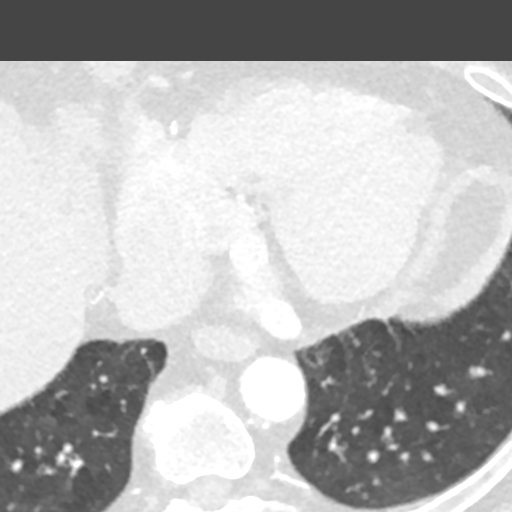
[im 307/1380  vessel]
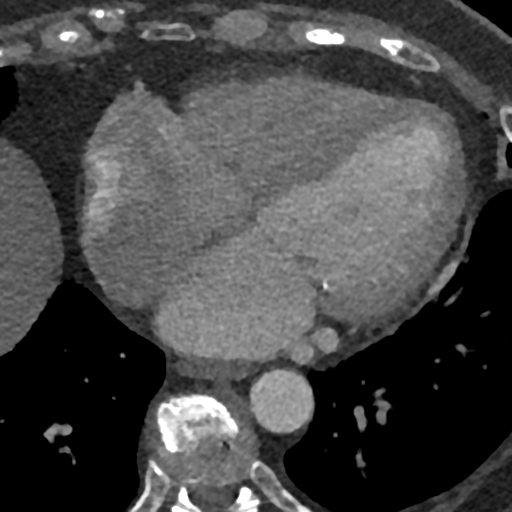
[im 460/1380  vessel]
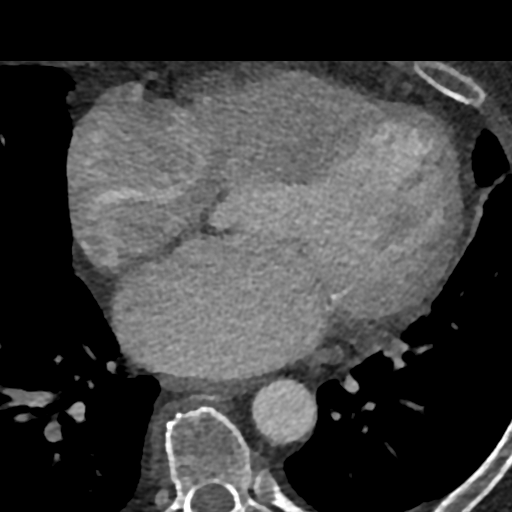
[im 613/1380  vessel]
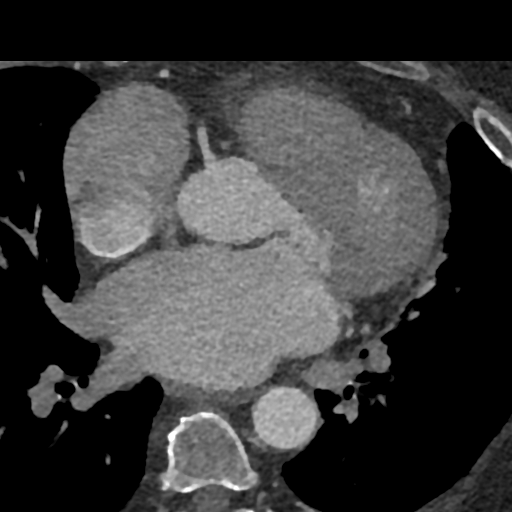
[im 767/1380  vessel]
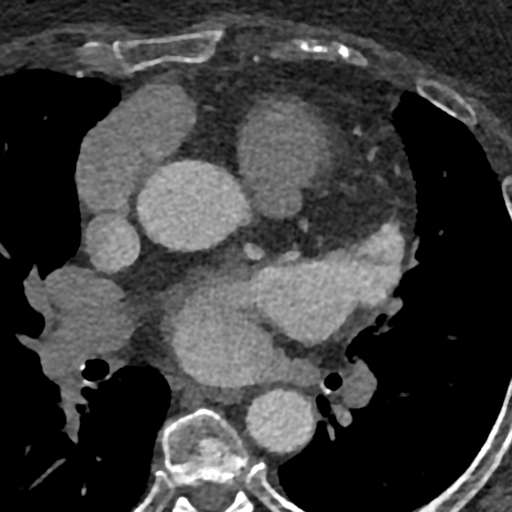
[im 767/1380  lung]
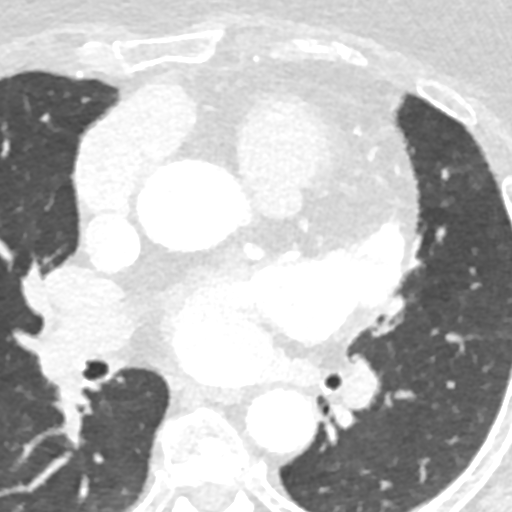
[im 920/1380  vessel]
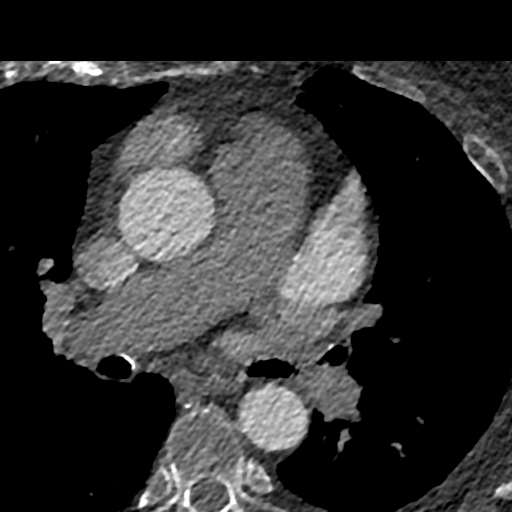
[im 1073/1380  vessel]
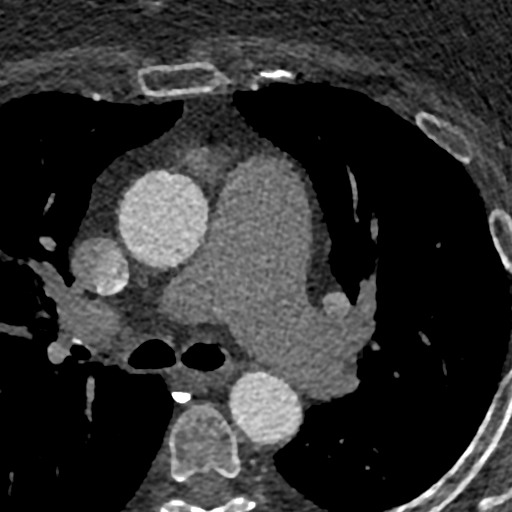
[im 1226/1380  vessel]
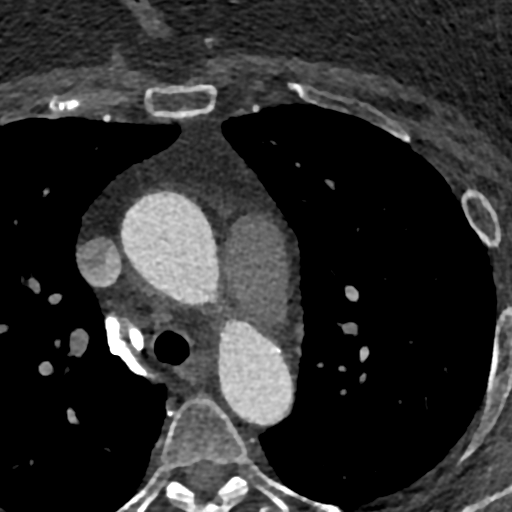

[Series 13: 25-45% · axial · 0.62mm/px · z∈[+1114,+1197]mm · 4 of 828 slices shown]
[im 166/828  vessel]
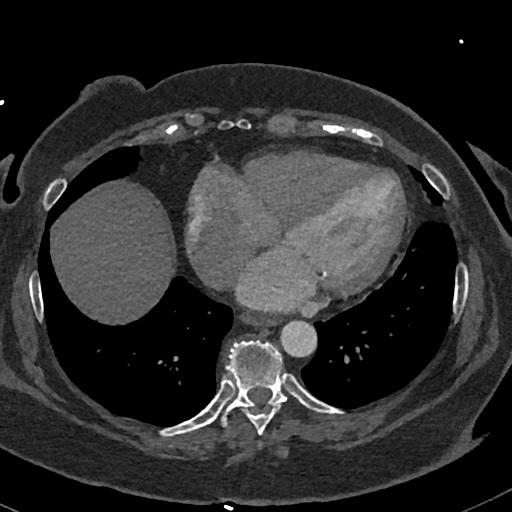
[im 331/828  vessel]
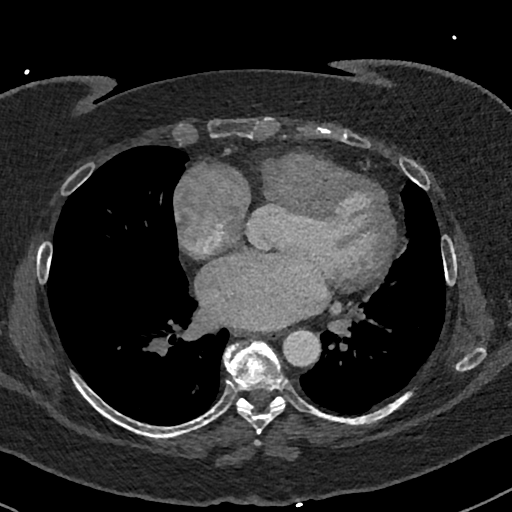
[im 497/828  vessel]
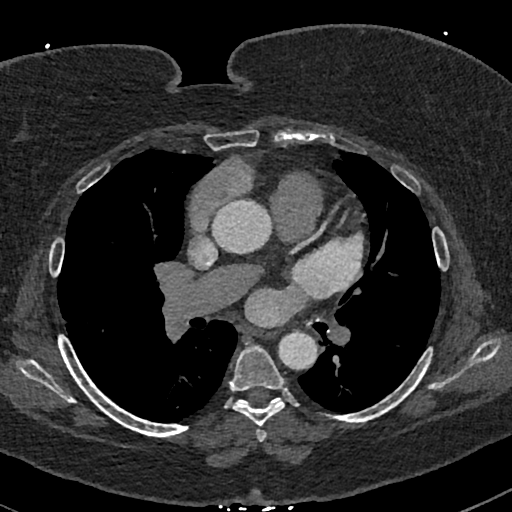
[im 662/828  vessel]
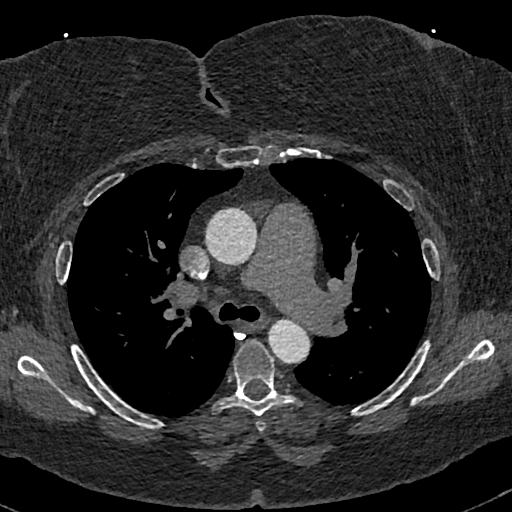

[12 of 20 positions shown; findings below may reference images not displayed]

FINDINGS: Aortic atherosclerosis. Dilatation of the pulmonic trunk (3.7 cm in
diameter). The visualized portions of the thorax there are no
suspicious appearing pulmonary nodules or masses, there is no acute
consolidative airspace disease, no pleural effusions, no
pneumothorax and no lymphadenopathy. Visualized portions of the
upper abdomen are unremarkable. There are no aggressive appearing
lytic or blastic lesions noted in the visualized portions of the
skeleton.
IMPRESSION: 1.  Aortic Atherosclerosis (A4WYS-J4X.X).
2. Dilatation of the pulmonic trunk (3.7 cm in diameter), concerning
for pulmonary arterial hypertension.
FINDINGS: A 120 kV prospective scan was triggered in the descending thoracic
aorta at 111 HU's. Gantry rotation speed was 280 msecs and
collimation was .9 mm. No beta blockade and no NTG was given. The 3D
data set was reconstructed in 5% intervals of the R-R cycle. Phases
were analyzed on a dedicated work station using MPR, MIP and VRT
modes. The patient received 80 cc of contrast.

Left atrium: Mild enlargement. No thrombus in the left atrium or
left atrial appendage.

Left ventricle: Normal in size.

Right atrium: Mild enlargement.

Right ventricle:  Normal in size.

Pericardium: Normal thickness

Pulmonary veins:

Left superior pulmonary vein: Measures 2.1 cm x 1.9 cm in diameter
with area 2.9 cm^2.

Left inferior pulmonary vein: Measures 1.8 cm x 1.0 cm in diameter
with area 1.5 cm^2.

Right superior pulmonary vein (with early branching): Measures
cm x 2.0 cm in diameter with area 4.5 cm^2.

Right inferior pulmonary vein: Measures 2.1 cm x 1.8 cm in diameter
with area 2.8 cm^2.

Pulmonary arteries: Dilated main PA measuring 34mm

Coronary Arteries: Normal coronary origin. Right dominance. The
study was performed without use of NTG and insufficient for plaque
evaluation. Coronary calcium score 0.

Esophagus: Courses posterior to body of left atrium
IMPRESSION: 1. There is normal pulmonary vein drainage into the left atrium.
Measurements as reported

2. There is no thrombus in the left atrial appendage.

3. The esophagus runs in the left atrial midline.

4. Coronary calcium score 0.

*** End of Addendum ***
EXAM:
OVER-READ INTERPRETATION  CT CHEST

The following report is an over-read performed by radiologist Dr.
Judson Zachariah [REDACTED] on 05/08/2020. This
over-read does not include interpretation of cardiac or coronary
anatomy or pathology. The coronary calcium score/coronary CTA
interpretation by the cardiologist is attached.
FINDINGS: Aortic atherosclerosis. Dilatation of the pulmonic trunk (3.7 cm in
diameter). The visualized portions of the thorax there are no
suspicious appearing pulmonary nodules or masses, there is no acute
consolidative airspace disease, no pleural effusions, no
pneumothorax and no lymphadenopathy. Visualized portions of the
upper abdomen are unremarkable. There are no aggressive appearing
lytic or blastic lesions noted in the visualized portions of the
skeleton.
IMPRESSION: 1.  Aortic Atherosclerosis (A4WYS-J4X.X).
2. Dilatation of the pulmonic trunk (3.7 cm in diameter), concerning
for pulmonary arterial hypertension.

## 2020-05-08 MED ORDER — IOHEXOL 300 MG/ML  SOLN
80.0000 mL | Freq: Once | INTRAMUSCULAR | Status: AC | PRN
  Administered 2020-05-08: 80 mL via INTRAVENOUS

## 2020-05-13 ENCOUNTER — Other Ambulatory Visit: Payer: Self-pay | Admitting: Family Medicine

## 2020-05-13 ENCOUNTER — Other Ambulatory Visit (HOSPITAL_COMMUNITY)
Admission: RE | Admit: 2020-05-13 | Discharge: 2020-05-13 | Disposition: A | Discharge status: Home / Self Care | Payer: PPO | Source: Ambulatory Visit | Attending: Cardiology | Admitting: Cardiology

## 2020-05-13 DIAGNOSIS — Z01812 Encounter for preprocedural laboratory examination: Secondary | ICD-10-CM | POA: Diagnosis not present

## 2020-05-13 DIAGNOSIS — Z20822 Contact with and (suspected) exposure to covid-19: Secondary | ICD-10-CM | POA: Insufficient documentation

## 2020-05-13 LAB — SARS CORONAVIRUS 2 (TAT 6-24 HRS): SARS Coronavirus 2: NEGATIVE

## 2020-05-14 ENCOUNTER — Ambulatory Visit: Payer: PPO | Admitting: Family Medicine

## 2020-05-14 NOTE — Pre-Procedure Instructions (Signed)
Instructed patient on the following items: Arrival time 0630 Nothing to eat or drink after midnight No meds AM of procedure Responsible person to drive you home and stay with you for 24 hrs  Have you missed any doses of anti-coagulant Xarleto- hasn't missed any doses

## 2020-05-15 ENCOUNTER — Ambulatory Visit (HOSPITAL_COMMUNITY): Payer: PPO | Admitting: Registered Nurse

## 2020-05-15 ENCOUNTER — Ambulatory Visit (HOSPITAL_COMMUNITY)
Admission: RE | Admit: 2020-05-15 | Discharge: 2020-05-15 | Disposition: A | Discharge status: Home / Self Care | Payer: PPO | Source: Ambulatory Visit | Attending: Cardiology | Admitting: Cardiology

## 2020-05-15 ENCOUNTER — Encounter (HOSPITAL_COMMUNITY)
Admission: RE | Disposition: A | Discharge status: Home / Self Care | Payer: Self-pay | Source: Ambulatory Visit | Attending: Cardiology

## 2020-05-15 ENCOUNTER — Other Ambulatory Visit: Payer: Self-pay

## 2020-05-15 DIAGNOSIS — Z90710 Acquired absence of both cervix and uterus: Secondary | ICD-10-CM | POA: Insufficient documentation

## 2020-05-15 DIAGNOSIS — I480 Paroxysmal atrial fibrillation: Secondary | ICD-10-CM | POA: Diagnosis not present

## 2020-05-15 DIAGNOSIS — Z96649 Presence of unspecified artificial hip joint: Secondary | ICD-10-CM | POA: Diagnosis not present

## 2020-05-15 DIAGNOSIS — I4819 Other persistent atrial fibrillation: Secondary | ICD-10-CM | POA: Diagnosis not present

## 2020-05-15 DIAGNOSIS — Z6841 Body Mass Index (BMI) 40.0 and over, adult: Secondary | ICD-10-CM | POA: Insufficient documentation

## 2020-05-15 DIAGNOSIS — Z90722 Acquired absence of ovaries, bilateral: Secondary | ICD-10-CM | POA: Diagnosis not present

## 2020-05-15 DIAGNOSIS — Z859 Personal history of malignant neoplasm, unspecified: Secondary | ICD-10-CM | POA: Insufficient documentation

## 2020-05-15 DIAGNOSIS — I4892 Unspecified atrial flutter: Secondary | ICD-10-CM | POA: Diagnosis not present

## 2020-05-15 DIAGNOSIS — D649 Anemia, unspecified: Secondary | ICD-10-CM | POA: Diagnosis not present

## 2020-05-15 DIAGNOSIS — E669 Obesity, unspecified: Secondary | ICD-10-CM | POA: Diagnosis not present

## 2020-05-15 DIAGNOSIS — Z809 Family history of malignant neoplasm, unspecified: Secondary | ICD-10-CM | POA: Insufficient documentation

## 2020-05-15 DIAGNOSIS — E7800 Pure hypercholesterolemia, unspecified: Secondary | ICD-10-CM | POA: Diagnosis not present

## 2020-05-15 HISTORY — PX: ATRIAL FIBRILLATION ABLATION: EP1191

## 2020-05-15 LAB — POCT ACTIVATED CLOTTING TIME
Activated Clotting Time: 398 seconds
Activated Clotting Time: 452 seconds

## 2020-05-15 SURGERY — ATRIAL FIBRILLATION ABLATION
Anesthesia: General

## 2020-05-15 MED ORDER — HEPARIN (PORCINE) IN NACL 1000-0.9 UT/500ML-% IV SOLN
INTRAVENOUS | Status: AC
  Filled 2020-05-15: qty 500

## 2020-05-15 MED ORDER — EPHEDRINE SULFATE-NACL 50-0.9 MG/10ML-% IV SOSY
PREFILLED_SYRINGE | INTRAVENOUS | Status: DC | PRN
  Administered 2020-05-15 (×2): 10 mg via INTRAVENOUS

## 2020-05-15 MED ORDER — ONDANSETRON HCL 4 MG/2ML IJ SOLN
INTRAMUSCULAR | Status: DC | PRN
  Administered 2020-05-15: 4 mg via INTRAVENOUS

## 2020-05-15 MED ORDER — SODIUM CHLORIDE 0.9 % IV SOLN: INTRAVENOUS | Status: DC

## 2020-05-15 MED ORDER — PROTAMINE SULFATE 10 MG/ML IV SOLN
INTRAVENOUS | Status: DC | PRN
  Administered 2020-05-15: 40 mg via INTRAVENOUS

## 2020-05-15 MED ORDER — DEXAMETHASONE SODIUM PHOSPHATE 10 MG/ML IJ SOLN
INTRAMUSCULAR | Status: DC | PRN
  Administered 2020-05-15: 5 mg via INTRAVENOUS

## 2020-05-15 MED ORDER — HEPARIN (PORCINE) IN NACL 1000-0.9 UT/500ML-% IV SOLN
INTRAVENOUS | Status: DC | PRN
  Administered 2020-05-15 (×5): 500 mL

## 2020-05-15 MED ORDER — PROPOFOL 10 MG/ML IV BOLUS
INTRAVENOUS | Status: DC | PRN
  Administered 2020-05-15: 170 mg via INTRAVENOUS

## 2020-05-15 MED ORDER — SODIUM CHLORIDE 0.9% FLUSH: 3.0000 mL | INTRAVENOUS | Status: DC | PRN

## 2020-05-15 MED ORDER — SODIUM CHLORIDE 0.9 % IV SOLN: 250.0000 mL | INTRAVENOUS | Status: DC | PRN

## 2020-05-15 MED ORDER — LIDOCAINE 2% (20 MG/ML) 5 ML SYRINGE
INTRAMUSCULAR | Status: DC | PRN
  Administered 2020-05-15: 60 mg via INTRAVENOUS

## 2020-05-15 MED ORDER — HEPARIN SODIUM (PORCINE) 1000 UNIT/ML IJ SOLN
INTRAMUSCULAR | Status: AC
  Filled 2020-05-15: qty 1

## 2020-05-15 MED ORDER — SUGAMMADEX SODIUM 200 MG/2ML IV SOLN
INTRAVENOUS | Status: DC | PRN
  Administered 2020-05-15: 100 mg via INTRAVENOUS
  Administered 2020-05-15: 200 mg via INTRAVENOUS

## 2020-05-15 MED ORDER — PHENYLEPHRINE HCL-NACL 10-0.9 MG/250ML-% IV SOLN
INTRAVENOUS | Status: DC | PRN
  Administered 2020-05-15: 30 ug/min via INTRAVENOUS

## 2020-05-15 MED ORDER — ONDANSETRON HCL 4 MG/2ML IJ SOLN: 4.0000 mg | Freq: Four times a day (QID) | INTRAMUSCULAR | Status: DC | PRN

## 2020-05-15 MED ORDER — PHENYLEPHRINE 40 MCG/ML (10ML) SYRINGE FOR IV PUSH (FOR BLOOD PRESSURE SUPPORT)
PREFILLED_SYRINGE | INTRAVENOUS | Status: DC | PRN
  Administered 2020-05-15 (×3): 120 ug via INTRAVENOUS

## 2020-05-15 MED ORDER — DOBUTAMINE IN D5W 4-5 MG/ML-% IV SOLN
INTRAVENOUS | Status: AC
  Filled 2020-05-15: qty 250

## 2020-05-15 MED ORDER — FENTANYL CITRATE (PF) 250 MCG/5ML IJ SOLN
INTRAMUSCULAR | Status: DC | PRN
  Administered 2020-05-15: 100 ug via INTRAVENOUS

## 2020-05-15 MED ORDER — SUCCINYLCHOLINE CHLORIDE 200 MG/10ML IV SOSY
PREFILLED_SYRINGE | INTRAVENOUS | Status: DC | PRN
  Administered 2020-05-15: 140 mg via INTRAVENOUS

## 2020-05-15 MED ORDER — DOBUTAMINE IN D5W 4-5 MG/ML-% IV SOLN
INTRAVENOUS | Status: DC | PRN
  Administered 2020-05-15: 20 ug/kg/min via INTRAVENOUS

## 2020-05-15 MED ORDER — HEPARIN SODIUM (PORCINE) 1000 UNIT/ML IJ SOLN
INTRAMUSCULAR | Status: DC | PRN
  Administered 2020-05-15: 1000 [IU] via INTRAVENOUS

## 2020-05-15 MED ORDER — ROCURONIUM BROMIDE 10 MG/ML (PF) SYRINGE
PREFILLED_SYRINGE | INTRAVENOUS | Status: DC | PRN
  Administered 2020-05-15: 30 mg via INTRAVENOUS

## 2020-05-15 MED ORDER — ACETAMINOPHEN 325 MG PO TABS
650.0000 mg | ORAL_TABLET | ORAL | Status: DC | PRN
  Filled 2020-05-15: qty 2

## 2020-05-15 MED ORDER — HEPARIN SODIUM (PORCINE) 1000 UNIT/ML IJ SOLN
INTRAMUSCULAR | Status: DC | PRN
  Administered 2020-05-15: 15000 [IU] via INTRAVENOUS

## 2020-05-15 SURGICAL SUPPLY — 21 items
BAG SNAP BAND KOVER 36X36 (MISCELLANEOUS) ×2 IMPLANT
BLANKET WARM UNDERBOD FULL ACC (MISCELLANEOUS) ×2 IMPLANT
CATH MAPPNG PENTARAY F 2-6-2MM (CATHETERS) ×1 IMPLANT
CATH S CIRCA THERM PROBE 10F (CATHETERS) ×2 IMPLANT
CATH SMTCH THERMOCOOL SF DF (CATHETERS) ×2 IMPLANT
CATH SOUNDSTAR ECO 8FR (CATHETERS) ×2 IMPLANT
CATH WEBSTER BI DIR CS D-F CRV (CATHETERS) ×2 IMPLANT
CLOSURE PERCLOSE PROSTYLE (VASCULAR PRODUCTS) ×8 IMPLANT
COVER SWIFTLINK CONNECTOR (BAG) ×2 IMPLANT
KIT VERSACROSS STEERABLE D1 (CATHETERS) ×2 IMPLANT
PACK EP LATEX FREE (CUSTOM PROCEDURE TRAY) ×2
PACK EP LF (CUSTOM PROCEDURE TRAY) ×1 IMPLANT
PAD PRO RADIOLUCENT 2001M-C (PAD) ×2 IMPLANT
PATCH CARTO3 (PAD) ×2 IMPLANT
PENTARAY F 2-6-2MM (CATHETERS) ×2
SHEATH CARTO VIZIGO SM CVD (SHEATH) ×2 IMPLANT
SHEATH PINNACLE 7F 10CM (SHEATH) ×2 IMPLANT
SHEATH PINNACLE 8F 10CM (SHEATH) ×4 IMPLANT
SHEATH PINNACLE 9F 10CM (SHEATH) ×2 IMPLANT
SHEATH PROBE COVER 6X72 (BAG) ×2 IMPLANT
TUBING SMART ABLATE COOLFLOW (TUBING) ×2 IMPLANT

## 2020-05-15 NOTE — Transfer of Care (Signed)
Immediate Anesthesia Transfer of Care Note  Patient: Helen West  Procedure(s) Performed: ATRIAL FIBRILLATION ABLATION (N/A )  Patient Location: PACU and Cath Lab  Anesthesia Type:General  Level of Consciousness: awake, alert  and oriented  Airway & Oxygen Therapy: Patient Spontanous Breathing and Patient connected to nasal cannula oxygen  Post-op Assessment: Report given to RN and Post -op Vital signs reviewed and stable  Post vital signs: Reviewed and stable  Last Vitals:  Vitals Value Taken Time  BP 124/51 05/15/20 1106  Temp    Pulse 85 05/15/20 1108  Resp 11 05/15/20 1108  SpO2 94 % 05/15/20 1108  Vitals shown include unvalidated device data.  Last Pain:  Vitals:   05/15/20 0713  PainSc: 0-No pain         Complications: No complications documented.

## 2020-05-15 NOTE — Anesthesia Procedure Notes (Signed)
Procedure Name: Intubation Date/Time: 05/15/2020 8:50 AM Performed by: Trinna Post., CRNA Pre-anesthesia Checklist: Patient identified, Emergency Drugs available, Suction available, Patient being monitored and Timeout performed Patient Re-evaluated:Patient Re-evaluated prior to induction Oxygen Delivery Method: Circle system utilized Preoxygenation: Pre-oxygenation with 100% oxygen Induction Type: IV induction and Rapid sequence Laryngoscope Size: Mac and 3 Grade View: Grade I Tube type: Oral Tube size: 7.0 mm Number of attempts: 1 Airway Equipment and Method: Stylet Placement Confirmation: ETT inserted through vocal cords under direct vision,  positive ETCO2 and breath sounds checked- equal and bilateral Secured at: 22 cm Tube secured with: Tape Dental Injury: Teeth and Oropharynx as per pre-operative assessment

## 2020-05-15 NOTE — Progress Notes (Signed)
Pt ambulated without difficulty or bleeding.   Discharged home with husband who will drive and stay with pt x 24 hrs  

## 2020-05-15 NOTE — Anesthesia Preprocedure Evaluation (Addendum)
Anesthesia Evaluation  Patient identified by MRN, date of birth, ID band Patient awake    Reviewed: Allergy & Precautions, NPO status   Airway Mallampati: II  TM Distance: >3 FB     Dental   Pulmonary    breath sounds clear to auscultation       Cardiovascular + dysrhythmias Atrial Fibrillation  Rhythm:Irregular     Neuro/Psych    GI/Hepatic negative GI ROS, Neg liver ROS,   Endo/Other  negative endocrine ROS  Renal/GU negative Renal ROS     Musculoskeletal  (+) Arthritis ,   Abdominal   Peds  Hematology  (+) anemia ,   Anesthesia Other Findings   Reproductive/Obstetrics                           Anesthesia Physical Anesthesia Plan  ASA: III  Anesthesia Plan: General   Post-op Pain Management:    Induction: Intravenous  PONV Risk Score and Plan: 3  Airway Management Planned: Oral ETT  Additional Equipment:   Intra-op Plan:   Post-operative Plan: Extubation in OR  Informed Consent: I have reviewed the patients History and Physical, chart, labs and discussed the procedure including the risks, benefits and alternatives for the proposed anesthesia with the patient or authorized representative who has indicated his/her understanding and acceptance.     Dental advisory given  Plan Discussed with: CRNA and Anesthesiologist  Anesthesia Plan Comments:         Anesthesia Quick Evaluation

## 2020-05-15 NOTE — Anesthesia Postprocedure Evaluation (Signed)
Anesthesia Post Note  Patient: Helen West  Procedure(s) Performed: ATRIAL FIBRILLATION ABLATION (N/A )     Patient location during evaluation: PACU Anesthesia Type: General Level of consciousness: awake Pain management: pain level controlled Vital Signs Assessment: post-procedure vital signs reviewed and stable Respiratory status: spontaneous breathing Cardiovascular status: stable Postop Assessment: no apparent nausea or vomiting Anesthetic complications: no   No complications documented.  Last Vitals:  Vitals:   05/15/20 1110 05/15/20 1115  BP: (!) 132/57 (!) 147/54  Pulse: 85 83  Resp: 12 11  Temp:    SpO2: 95% 100%    Last Pain:  Vitals:   05/15/20 1105  PainSc: 0-No pain                 Panhia Karl

## 2020-05-15 NOTE — Discharge Instructions (Signed)
Post procedure care instructions No driving for 4 days. No lifting over 5 lbs for 1 week. No vigorous or sexual activity for 1 week. You may return to work/your usual activities on 05/23/20. Keep procedure site clean & dry. If you notice increased pain, swelling, bleeding or pus, call/return!  You may shower after 24 hours, but no soaking in baths/hot tubs/pools for 1 week.     Cardiac Ablation, Care After  This sheet gives you information about how to care for yourself after your procedure. Your health care provider may also give you more specific instructions. If you have problems or questions, contact your health care provider. What can I expect after the procedure? After the procedure, it is common to have:  Bruising around your puncture site.  Tenderness around your puncture site.  Skipped heartbeats.  Tiredness (fatigue).  Follow these instructions at home: Puncture site care   Follow instructions from your health care provider about how to take care of your puncture site. Make sure you: ? If present, leave stitches (sutures), skin glue, or adhesive strips in place. These skin closures may need to stay in place for up to 2 weeks. If adhesive strip edges start to loosen and curl up, you may trim the loose edges. Do not remove adhesive strips completely unless your health care provider tells you to do that. ? If a large square bandage is present, this may be removed 24 hours after surgery.   Check your puncture site every day for signs of infection. Check for: ? Redness, swelling, or pain. ? Fluid or blood. If your puncture site starts to bleed, lie down on your back, apply firm pressure to the area, and contact your health care provider. ? Warmth. ? Pus or a bad smell. Driving  Do not drive for at least 4 days after your procedure or however long your health care provider recommends. (Do not resume driving if you have previously been instructed not to drive for other health  reasons.)  Do not drive or use heavy machinery while taking prescription pain medicine. Activity  Avoid activities that take a lot of effort for at least 7 days after your procedure.  Do not lift anything that is heavier than 5 lb (4.5 kg) for one week.   No sexual activity for 1 week.   Return to your normal activities as told by your health care provider. Ask your health care provider what activities are safe for you. General instructions  Take over-the-counter and prescription medicines only as told by your health care provider.  Do not use any products that contain nicotine or tobacco, such as cigarettes and e-cigarettes. If you need help quitting, ask your health care provider.  You may shower after 24 hours, but Do not take baths, swim, or use a hot tub for 1 week.   Do not drink alcohol for 24 hours after your procedure.  Keep all follow-up visits as told by your health care provider. This is important. Contact a health care provider if:  You have redness, mild swelling, or pain around your puncture site.  You have fluid or blood coming from your puncture site that stops after applying firm pressure to the area.  Your puncture site feels warm to the touch.  You have pus or a bad smell coming from your puncture site.  You have a fever.  You have chest pain or discomfort that spreads to your neck, jaw, or arm.  You are sweating a lot.  You feel nauseous.  You have a fast or irregular heartbeat.  You have shortness of breath.  You are dizzy or light-headed and feel the need to lie down.  You have pain or numbness in the arm or leg closest to your puncture site. Get help right away if:  Your puncture site suddenly swells.  Your puncture site is bleeding and the bleeding does not stop after applying firm pressure to the area. These symptoms may represent a serious problem that is an emergency. Do not wait to see if the symptoms will go away. Get medical help  right away. Call your local emergency services (911 in the [place] Do not drive yourself to the hospital. Summary  After the procedure, it is normal to have bruising and tenderness at the puncture site in your groin, neck, or forearm.  Check your puncture site every day for signs of infection.  Get help right away if your puncture site is bleeding and the bleeding does not stop after applying firm pressure to the area. This is a medical emergency. This information is not intended to replace advice given to you by your health care provider. Make sure you discuss any questions you have with your health care provider.   You have an appointment set up with the [facility].  Multiple studies have shown that being followed by a dedicated atrial fibrillation clinic in addition to the standard care you receive from your other physicians improves health. We believe that enrollment in the atrial fibrillation clinic will allow [place] to better care for you.   The phone number to the [facility] is [phone]. The clinic is staffed Monday through Friday from 8:30am to 5pm.  Parking Directions: The clinic is located in the Heart and Vascular Building connected to [facility]. 1)From [address] turn on to [address] and go to the 3rd entrance  (Heart and Vascular entrance) on the right. 2)Look to the right for Heart &Vascular Parking Garage. 3)A code for the entrance is required, for May is 3211.   4)Take the elevators to the 1st floor. Registration is in the room with the glass walls at the end of the hallway.  If you have any trouble parking or locating the clinic, please don't hesitate to call [phone].

## 2020-05-15 NOTE — H&P (Signed)
Electrophysiology Office Note   Date:  05/15/2020   ID:  CHIOMA West, DOB [birth year], MRN [id number]  PCP:  [name], MD  Cardiologist:  Debara Pickett Primary Electrophysiologist:  Charle Clear Meredith Leeds, MD    Chief Complaint: palpitations   History of Present Illness: Helen West is a 65 y.o. female who is being seen today for the evaluation of atrial fibrillation at the request of No ref. provider found. Presenting today for electrophysiology evaluation.  She has a history of atrial fibrillation, hyperlipidemia, and obesity. She had continued episodes of atrial fibrillation despite use of flecainide. She was a started on Multaq.  Today, denies symptoms of palpitations, chest pain, shortness of breath, orthopnea, PND, lower extremity edema, claudication, dizziness, presyncope, syncope, bleeding, or neurologic sequela. The patient is tolerating medications without difficulties. Plan for AF ablation today.    Past Medical History:  Diagnosis Date  . A-fib ([place])   . Anemia   . Arthritis   . Cancer ([place])   . Complication of anesthesia    Loss of vocal/voice x 2 months  . Dysrhythmia   . Headache   . High cholesterol   . Obesity   . Post-menopausal bleeding    Past Surgical History:  Procedure Laterality Date  . ROBOTIC ASSISTED TOTAL HYSTERECTOMY WITH BILATERAL SALPINGO OOPHERECTOMY Bilateral 11/02/2017   Procedure: XI ROBOTIC ASSISTED TOTAL HYSTERECTOMY WITH BILATERAL SALPINGO OOPHORECTOMY;  Surgeon: [name], MD;  Location: [facility];  Service: Gynecology;  Laterality: Bilateral;  . SENTINEL NODE BIOPSY N/A 11/02/2017   Procedure: SENTINEL NODE BIOPSY;  Surgeon: [name], MD;  Location: [facility];  Service: Gynecology;  Laterality: N/A;  . TONSILLECTOMY    . TOTAL HIP ARTHROPLASTY    . TUBAL LIGATION       Current Facility-Administered Medications  Medication Dose Route Frequency Provider Last Rate Last Admin  . 0.9 %  sodium chloride infusion   Intravenous Continuous  [name], MD 50 mL/hr at 05/15/20 0721 New Bag at 05/15/20 0721    Allergies:   Patient has no known allergies.   Social History:  The patient  reports that she has never smoked. She has never used smokeless tobacco. She reports current alcohol use. She reports that she does not use drugs.   Family History:  The patient's family history includes Aneurysm in her mother; Cancer in her paternal uncle; Emphysema in her father.   ROS:  Please see the history of present illness.   Otherwise, review of systems is positive for none.   All other systems are reviewed and negative.   PHYSICAL EXAM: VS:  BP 121/78   Pulse (!) 57   Temp 98 F (36.7 C)   Resp 16   Ht 5\' 2"  (1.575 m)   Wt 114 kg   SpO2 100%   BMI 45.97 kg/m  , BMI Body mass index is 45.97 kg/m. GEN: Well nourished, well developed, in no acute distress  HEENT: normal  Neck: no JVD, carotid bruits, or masses Cardiac: iRRR; no murmurs, rubs, or gallops,no edema  Respiratory:  clear to auscultation bilaterally, normal work of breathing GI: soft, nontender, nondistended, + BS MS: no deformity or atrophy  Skin: warm and dry Neuro:  Strength and sensation are intact Psych: euthymic mood, full affect  Recent Labs: 02/13/2020: TSH 2.440 02/15/2020: ALT 11 04/26/2020: BUN 18; Creatinine, Ser 0.81; Hemoglobin 14.9; Platelets 260; Potassium 4.7; Sodium 143    Lipid Panel     Component Value Date/Time  CHOL 225 (H) 02/13/2020 1000   TRIG 120 02/13/2020 1000   HDL 61 02/13/2020 1000   CHOLHDL 3.7 02/13/2020 1000   LDLCALC 143 (H) 02/13/2020 1000     Wt Readings from Last 3 Encounters:  05/15/20 114 kg  04/03/20 114.3 kg  04/01/20 119.7 kg      Other studies Reviewed: Additional studies/ records that were reviewed today include: Monitor 01/17/19  Review of the above records today demonstrates:  Monitor shows PAF/flutter with possible aberrancy vs. NSVT (up to 12 beats).   ASSESSMENT AND PLAN:  1.   Persistent atrial fibrillation: [name] has presented today for surgery, with the diagnosis of atrial fibrillation.  The various methods of treatment have been discussed with the patient and family. After consideration of risks, benefits and other options for treatment, the patient has consented to  Procedure(s): Catheter ablation as a surgical intervention .  Risks include but not limited to complete heart block, stroke, esophageal damage, nerve damage, bleeding, vascular damage, tamponade, perforation, MI, and death. The patient's history has been reviewed, patient examined, no change in status, stable for surgery.  I have reviewed the patient's chart and labs.  Questions were answered to the patient's satisfaction.    Kwinton Maahs Curt Bears, MD 05/15/2020 7:52 AM

## 2020-05-16 ENCOUNTER — Ambulatory Visit: Payer: Self-pay | Admitting: Radiation Oncology

## 2020-05-16 ENCOUNTER — Encounter (HOSPITAL_COMMUNITY): Payer: Self-pay | Admitting: Cardiology

## 2020-05-29 ENCOUNTER — Other Ambulatory Visit: Payer: Self-pay | Admitting: Cardiology

## 2020-05-29 NOTE — Telephone Encounter (Signed)
Prescription refill request for Xarelto received.  Indication: afib  Last office visit: 04/01/2020- [facility]  Weight: 114 kg  Age: 65 yo  Scr: 0.95, 02/15/2020 CrCl: 135ml/min   Pt is in the correct dose of Xarelto per dosing criteria, prescription refill sent for Xarelto 20mg  daily.

## 2020-05-30 NOTE — Progress Notes (Signed)
Subjective:  Patient ID: Helen West, female    DOB: 1955-06-29  Age: 65 y.o. MRN: [id number]  Chief Complaint  Patient presents with  . Hyperlipidemia  . Hypertension    HPI  Primary hypertension Losartan, metoprolol, chlorthalidone  Longstanding persistent atrial fibrillation (HCC) Xarelto 20 mg once daily, lopressor 25 mg one twice a day, chlorthalidone 25 mg once daily. Pt had ablation mid April 2022.   Mixed hyperlipidemia crestor 10 mg once daily. Eating healthy. Not exercising yet because is feeling better with ablation.  Current Outpatient Medications on File Prior to Visit  Medication Sig Dispense Refill  . acetaminophen (TYLENOL) 650 MG CR tablet Take 1,950 mg by mouth in the morning and at bedtime.    . chlorthalidone (HYGROTON) 25 MG tablet Take 1 tablet (25 mg total) by mouth daily. (Patient taking differently: Take 25 mg by mouth in the morning.) 90 tablet 1  . losartan (COZAAR) 100 MG tablet Take 1 tablet (100 mg total) by mouth daily. (Patient taking differently: Take 100 mg by mouth every evening.) 90 tablet 1  . metoprolol tartrate (LOPRESSOR) 25 MG tablet Take 1 tablet (25 mg total) by mouth 2 (two) times daily. 180 tablet 1  . rosuvastatin (CRESTOR) 10 MG tablet Take 1 tablet (10 mg total) by mouth every evening. 90 tablet 0  . XARELTO 20 MG TABS tablet TAKE ONE (1) TABLET ONCE DAILY WITH SUPPER 90 tablet 1   No current facility-administered medications on file prior to visit.   Past Medical History:  Diagnosis Date  . A-fib ([place])   . Anemia   . Arthritis   . Cancer ([place])   . Complication of anesthesia    Loss of vocal/voice x 2 months  . Dysrhythmia   . Headache   . High cholesterol   . Obesity   . Post-menopausal bleeding    Past Surgical History:  Procedure Laterality Date  . ATRIAL FIBRILLATION ABLATION N/A 05/15/2020   Procedure: ATRIAL FIBRILLATION ABLATION;  Surgeon: [name], MD;  Location: [facility];  Service:  Cardiovascular;  Laterality: N/A;  . ROBOTIC ASSISTED TOTAL HYSTERECTOMY WITH BILATERAL SALPINGO OOPHERECTOMY Bilateral 11/02/2017   Procedure: XI ROBOTIC ASSISTED TOTAL HYSTERECTOMY WITH BILATERAL SALPINGO OOPHORECTOMY;  Surgeon: [name], MD;  Location: [facility];  Service: Gynecology;  Laterality: Bilateral;  . SENTINEL NODE BIOPSY N/A 11/02/2017   Procedure: SENTINEL NODE BIOPSY;  Surgeon: [name], MD;  Location: [facility];  Service: Gynecology;  Laterality: N/A;  . TONSILLECTOMY    . TOTAL HIP ARTHROPLASTY    . TUBAL LIGATION      Family History  Problem Relation Age of Onset  . Aneurysm Mother   . Emphysema Father   . Cancer Paternal Uncle    Social History   Socioeconomic History  . Marital status: Married    Spouse name: Not on file  . Number of children: Not on file  . Years of education: Not on file  . Highest education level: Not on file  Occupational History  . Not on file  Tobacco Use  . Smoking status: Never Smoker  . Smokeless tobacco: Never Used  Vaping Use  . Vaping Use: Never used  Substance and Sexual Activity  . Alcohol use: Yes    Comment: occasional social  . Drug use: Never  . Sexual activity: Not on file  Other Topics Concern  . Not on file  Social History Narrative  . Not on file   Social Determinants  of Health   Financial Resource Strain: Not on file  Food Insecurity: Not on file  Transportation Needs: Not on file  Physical Activity: Not on file  Stress: Not on file  Social Connections: Not on file    Review of Systems  Constitutional: Negative for chills, fatigue and fever.  HENT: Positive for rhinorrhea. Negative for congestion, ear pain and sore throat.   Respiratory: Positive for cough (secondary to pnd.). Negative for shortness of breath.   Cardiovascular: Negative for chest pain, palpitations and leg swelling.  Gastrointestinal: Negative for abdominal pain, diarrhea, nausea and vomiting.       Bloating and gas. Some mild  constipation.   Endocrine: Negative for polydipsia, polyphagia and polyuria.  Genitourinary: Negative for dysuria and urgency.  Musculoskeletal: Negative for arthralgias, back pain and myalgias.  Skin: Wound: puncture wound on left lwer cheek/neck from cat claw. Eyrthema 3. 2 cm. minimal heat. nontender.   Neurological: Negative for dizziness, weakness, light-headedness and headaches.  Psychiatric/Behavioral: Negative for dysphoric mood. The patient is not nervous/anxious.      Objective:  BP 110/78   Pulse 96   Temp (!) 97.1 F (36.2 C)   Resp 18   Ht 5\' 2"  (1.575 m)   Wt 257 lb 9.6 oz (116.8 kg)   BMI 47.12 kg/m   BP/Weight 05/31/2020 05/01/5186 05/03/6604  Systolic BP 301 601 093  Diastolic BP 78 52 85  Wt. (Lbs) 257.6 251.32 -  BMI 47.12 45.97 -    Physical Exam Vitals reviewed.  Constitutional:      Appearance: Normal appearance. She is normal weight.  Neck:     Vascular: No carotid bruit.  Cardiovascular:     Rate and Rhythm: Normal rate and regular rhythm.     Pulses: Normal pulses.     Heart sounds: Normal heart sounds.  Pulmonary:     Effort: Pulmonary effort is normal. No respiratory distress.     Breath sounds: Normal breath sounds.  Abdominal:     General: Abdomen is flat. Bowel sounds are normal.     Palpations: Abdomen is soft.     Tenderness: There is no abdominal tenderness.  Skin:    Findings: Lesion (puncture wound on left lwer cheek/neck from cat claw. Eyrthema 3. 2 cm. minimal heat. nontender.  ) present.  Neurological:     Mental Status: She is alert and oriented to person, place, and time.  Psychiatric:        Mood and Affect: Mood normal.        Behavior: Behavior normal.     Diabetic Foot Exam - Simple   No data filed      Lab Results  Component Value Date   WBC 5.1 04/26/2020   HGB 14.9 04/26/2020   HCT 45.4 04/26/2020   PLT 260 04/26/2020   GLUCOSE 99 04/26/2020   CHOL 225 (H) 02/13/2020   TRIG 120 02/13/2020   HDL 61  02/13/2020   LDLCALC 143 (H) 02/13/2020   ALT 11 02/15/2020   AST 12 02/15/2020   NA 143 04/26/2020   K 4.7 04/26/2020   CL 99 04/26/2020   CREATININE 0.81 04/26/2020   BUN 18 04/26/2020   CO2 27 04/26/2020   TSH 2.440 02/13/2020      Assessment & Plan:   1. Primary hypertension Well controlled.  No changes to medicines.  Continue to work on eating a healthy diet and exercise.  Labs drawn today.   - CBC with Differential/Platelet - Comprehensive metabolic  panel  2. Longstanding persistent atrial fibrillation (HCC) The current medical regimen is effective;  continue present plan and medications. Improved.   3. Mixed hyperlipidemia The current medical regimen is effective;  continue present plan and medications. Recommend continue to work on eating healthy diet and exercise.  - Lipid panel  4. Cellulitis of face  zpack. To cover cat scratch fever.   5.  Obesity with BMI of 47 Recommend low-fat, low sugar diet and exercise.  6.  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  Meds ordered this encounter  Medications  . azithromycin (ZITHROMAX) 250 MG tablet    Sig: 2 DAILY FOR FIRST DAY, THEN DECREASE TO ONE DAILY FOR 4 MORE DAYS.    Dispense:  6 tablet    Refill:  0    Orders Placed This Encounter  Procedures  . CBC with Differential/Platelet  . Comprehensive metabolic panel  . Lipid panel     Follow-up: Return in about 6 months (around 11/30/2020), or fasting.  An After Visit Summary was printed and given to the patient.  [name], MD [facility] [phone]

## 2020-05-31 ENCOUNTER — Encounter: Payer: Self-pay | Admitting: Family Medicine

## 2020-05-31 ENCOUNTER — Ambulatory Visit (INDEPENDENT_AMBULATORY_CARE_PROVIDER_SITE_OTHER): Payer: PPO | Admitting: Family Medicine

## 2020-05-31 ENCOUNTER — Other Ambulatory Visit: Payer: Self-pay

## 2020-05-31 VITALS — BP 110/78 | HR 96 | Temp 97.1°F | Resp 18 | Ht 62.0 in | Wt 257.6 lb

## 2020-05-31 DIAGNOSIS — L03211 Cellulitis of face: Secondary | ICD-10-CM | POA: Diagnosis not present

## 2020-05-31 DIAGNOSIS — I4811 Longstanding persistent atrial fibrillation: Secondary | ICD-10-CM | POA: Diagnosis not present

## 2020-05-31 DIAGNOSIS — I10 Essential (primary) hypertension: Secondary | ICD-10-CM | POA: Diagnosis not present

## 2020-05-31 DIAGNOSIS — E782 Mixed hyperlipidemia: Secondary | ICD-10-CM | POA: Diagnosis not present

## 2020-05-31 DIAGNOSIS — Z6841 Body Mass Index (BMI) 40.0 and over, adult: Secondary | ICD-10-CM

## 2020-05-31 DIAGNOSIS — D6869 Other thrombophilia: Secondary | ICD-10-CM

## 2020-05-31 MED ORDER — AZITHROMYCIN 250 MG PO TABS: ORAL_TABLET | ORAL | 0 refills | Status: DC

## 2020-06-01 LAB — CBC WITH DIFFERENTIAL/PLATELET
Basophils Absolute: 0 10*3/uL (ref 0.0–0.2)
Basos: 1 %
EOS (ABSOLUTE): 0.3 10*3/uL (ref 0.0–0.4)
Eos: 5 %
Hematocrit: 45.9 % (ref 34.0–46.6)
Hemoglobin: 15.1 g/dL (ref 11.1–15.9)
Immature Grans (Abs): 0 10*3/uL (ref 0.0–0.1)
Immature Granulocytes: 0 %
Lymphocytes Absolute: 1.5 10*3/uL (ref 0.7–3.1)
Lymphs: 25 %
MCH: 31.1 pg (ref 26.6–33.0)
MCHC: 32.9 g/dL (ref 31.5–35.7)
MCV: 95 fL (ref 79–97)
Monocytes Absolute: 0.5 10*3/uL (ref 0.1–0.9)
Monocytes: 9 %
Neutrophils Absolute: 3.7 10*3/uL (ref 1.4–7.0)
Neutrophils: 60 %
Platelets: 256 10*3/uL (ref 150–450)
RBC: 4.85 x10E6/uL (ref 3.77–5.28)
RDW: 12.6 % (ref 11.7–15.4)
WBC: 6.1 10*3/uL (ref 3.4–10.8)

## 2020-06-01 LAB — LIPID PANEL
Chol/HDL Ratio: 2.9 ratio (ref 0.0–4.4)
Cholesterol, Total: 170 mg/dL (ref 100–199)
HDL: 59 mg/dL (ref >39)
LDL Chol Calc (NIH): 91 mg/dL (ref 0–99)
Triglycerides: 112 mg/dL (ref 0–149)
VLDL Cholesterol Cal: 20 mg/dL (ref 5–40)

## 2020-06-01 LAB — COMPREHENSIVE METABOLIC PANEL
ALT: 19 IU/L (ref 0–32)
AST: 18 IU/L (ref 0–40)
Albumin/Globulin Ratio: 1.8 (ref 1.2–2.2)
Albumin: 4.6 g/dL (ref 3.8–4.8)
Alkaline Phosphatase: 103 IU/L (ref 44–121)
BUN/Creatinine Ratio: 25 (ref 12–28)
BUN: 20 mg/dL (ref 8–27)
Bilirubin Total: 0.5 mg/dL (ref 0.0–1.2)
CO2: 32 mmol/L — ABNORMAL HIGH (ref 20–29)
Calcium: 10.1 mg/dL (ref 8.7–10.3)
Chloride: 101 mmol/L (ref 96–106)
Creatinine, Ser: 0.81 mg/dL (ref 0.57–1.00)
Globulin, Total: 2.6 g/dL (ref 1.5–4.5)
Glucose: 111 mg/dL — ABNORMAL HIGH (ref 65–99)
Potassium: 5.3 mmol/L — ABNORMAL HIGH (ref 3.5–5.2)
Sodium: 146 mmol/L — ABNORMAL HIGH (ref 134–144)
Total Protein: 7.2 g/dL (ref 6.0–8.5)
eGFR: 80 mL/min/{1.73_m2} (ref >59)

## 2020-06-01 LAB — CARDIOVASCULAR RISK ASSESSMENT

## 2020-06-10 ENCOUNTER — Other Ambulatory Visit: Payer: Self-pay

## 2020-06-10 ENCOUNTER — Telehealth: Payer: Self-pay

## 2020-06-10 DIAGNOSIS — E875 Hyperkalemia: Secondary | ICD-10-CM

## 2020-06-10 NOTE — Progress Notes (Signed)
    Chronic Care Management Pharmacy Assistant   Name: Helen West  MRN: [id number] DOB: Jun 23, 1955  Called patient 3x and LVM. Attempts were unsuccessful   Reason for Encounter: General Adherence Call    Recent office visits:  05/31/2020: [name], MD (PCP) for HTN and Hyperlipidemia / Added Z-pak 5 day course. No other medication changes noted  04/03/2020: [name], MD (PCP) / increased Losartan 100 mg from 1 tablet qd  to bid.   Recent consult visits:  None noted  Hospital visits:  05/15/2020: @ [facility] / Ablation for Afib (DOS: 05/15/2020) / No medication changes noted upon discharge.    Medications: Outpatient Encounter Medications as of 06/10/2020  Medication Sig  . acetaminophen (TYLENOL) 650 MG CR tablet Take 1,950 mg by mouth in the morning and at bedtime.  Marland Kitchen azithromycin (ZITHROMAX) 250 MG tablet 2 DAILY FOR FIRST DAY, THEN DECREASE TO ONE DAILY FOR 4 MORE DAYS.  Marland Kitchen chlorthalidone (HYGROTON) 25 MG tablet Take 1 tablet (25 mg total) by mouth daily. (Patient taking differently: Take 25 mg by mouth in the morning.)  . losartan (COZAAR) 100 MG tablet Take 1 tablet (100 mg total) by mouth daily. (Patient taking differently: Take 100 mg by mouth every evening.)  . metoprolol tartrate (LOPRESSOR) 25 MG tablet Take 1 tablet (25 mg total) by mouth 2 (two) times daily.  . rosuvastatin (CRESTOR) 10 MG tablet Take 1 tablet (10 mg total) by mouth every evening.  Alveda Reasons 20 MG TABS tablet TAKE ONE (1) TABLET ONCE DAILY WITH SUPPER   No facility-administered encounter medications on file as of 06/10/2020.    [place] for general disease state call.   The patient's current and PDC medications and fill history are:   Medications:          Sig:                   Last filled:  Chlorthaliadone 25 mg.          1 tablet po qd   05/13/2020 Losartan 100 mg.  1 tablet po qd     04/03/2020  Metoprolol 25 mg.   1 tablet po qd   05/09/2020   1  Xarelto    1 tablet po w/  supper  05/09/2020 Rosuvastin    1 tablet po q evening              05/13/2020     STAR RATING DRUGS:  Medication:  Last filled:  Days supply: Rosuvastatin        05/13/2020 [place], [place] Clinical Pharmacist Assistant

## 2020-06-11 ENCOUNTER — Other Ambulatory Visit: Payer: Self-pay

## 2020-06-11 ENCOUNTER — Ambulatory Visit: Payer: PPO

## 2020-06-11 ENCOUNTER — Other Ambulatory Visit: Payer: PPO

## 2020-06-11 DIAGNOSIS — E875 Hyperkalemia: Secondary | ICD-10-CM | POA: Diagnosis not present

## 2020-06-12 ENCOUNTER — Other Ambulatory Visit: Payer: Self-pay

## 2020-06-12 ENCOUNTER — Encounter (HOSPITAL_COMMUNITY): Payer: Self-pay | Admitting: Nurse Practitioner

## 2020-06-12 ENCOUNTER — Ambulatory Visit (HOSPITAL_COMMUNITY)
Admission: RE | Admit: 2020-06-12 | Discharge: 2020-06-12 | Disposition: A | Discharge status: Home / Self Care | Payer: PPO | Source: Ambulatory Visit | Attending: Nurse Practitioner | Admitting: Nurse Practitioner

## 2020-06-12 VITALS — BP 134/82 | HR 71 | Ht 62.0 in | Wt 261.2 lb

## 2020-06-12 DIAGNOSIS — R140 Abdominal distension (gaseous): Secondary | ICD-10-CM | POA: Diagnosis not present

## 2020-06-12 DIAGNOSIS — Z79899 Other long term (current) drug therapy: Secondary | ICD-10-CM | POA: Insufficient documentation

## 2020-06-12 DIAGNOSIS — I4819 Other persistent atrial fibrillation: Secondary | ICD-10-CM

## 2020-06-12 DIAGNOSIS — D6869 Other thrombophilia: Secondary | ICD-10-CM | POA: Diagnosis not present

## 2020-06-12 DIAGNOSIS — I480 Paroxysmal atrial fibrillation: Secondary | ICD-10-CM | POA: Diagnosis not present

## 2020-06-12 DIAGNOSIS — I10 Essential (primary) hypertension: Secondary | ICD-10-CM | POA: Diagnosis not present

## 2020-06-12 LAB — COMPREHENSIVE METABOLIC PANEL
ALT: 15 IU/L (ref 0–32)
AST: 20 IU/L (ref 0–40)
Albumin/Globulin Ratio: 2 (ref 1.2–2.2)
Albumin: 4.6 g/dL (ref 3.8–4.8)
Alkaline Phosphatase: 91 IU/L (ref 44–121)
BUN/Creatinine Ratio: 27 (ref 12–28)
BUN: 18 mg/dL (ref 8–27)
Bilirubin Total: 0.3 mg/dL (ref 0.0–1.2)
CO2: 30 mmol/L — ABNORMAL HIGH (ref 20–29)
Calcium: 9.9 mg/dL (ref 8.7–10.3)
Chloride: 98 mmol/L (ref 96–106)
Creatinine, Ser: 0.66 mg/dL (ref 0.57–1.00)
Globulin, Total: 2.3 g/dL (ref 1.5–4.5)
Glucose: 107 mg/dL — ABNORMAL HIGH (ref 65–99)
Potassium: 5 mmol/L (ref 3.5–5.2)
Sodium: 142 mmol/L (ref 134–144)
Total Protein: 6.9 g/dL (ref 6.0–8.5)
eGFR: 96 mL/min/{1.73_m2} (ref >59)

## 2020-06-12 MED ORDER — OMEPRAZOLE 20 MG PO CPDR: 20.0000 mg | DELAYED_RELEASE_CAPSULE | Freq: Every day | ORAL | 1 refills | Status: DC

## 2020-06-12 NOTE — Progress Notes (Signed)
Primary Care Physician: [name], MD Referring Physician: Dr. Curt Bears Cardiologist: Dr. Marry Guan is a 65 y.o. female with a h/o paroxysmal afib that has failed flecainide and Multaq. She had afib ablation with Dr. Curt Bears 4/13. She has not noted any afib, just a rare flutter. She feels that she has had more gas and occasionally  with feel irritation with swallowing. She feels her bowels are not as regular as before surgery, now every other day instead of daily. Now back to her usual activities and feeling stronger every day. No groin issues.   Today, she denies symptoms of palpitations, chest pain, shortness of breath, orthopnea, PND, lower extremity edema, dizziness, presyncope, syncope, or neurologic sequela. The patient is tolerating medications without difficulties and is otherwise without complaint today.   Past Medical History:  Diagnosis Date  . A-fib ([place])   . Anemia   . Arthritis   . Cancer ([place])   . Complication of anesthesia    Loss of vocal/voice x 2 months  . Dysrhythmia   . Headache   . High cholesterol   . Obesity   . Post-menopausal bleeding    Past Surgical History:  Procedure Laterality Date  . ATRIAL FIBRILLATION ABLATION N/A 05/15/2020   Procedure: ATRIAL FIBRILLATION ABLATION;  Surgeon: [name], MD;  Location: [facility];  Service: Cardiovascular;  Laterality: N/A;  . ROBOTIC ASSISTED TOTAL HYSTERECTOMY WITH BILATERAL SALPINGO OOPHERECTOMY Bilateral 11/02/2017   Procedure: XI ROBOTIC ASSISTED TOTAL HYSTERECTOMY WITH BILATERAL SALPINGO OOPHORECTOMY;  Surgeon: [name], MD;  Location: [facility];  Service: Gynecology;  Laterality: Bilateral;  . SENTINEL NODE BIOPSY N/A 11/02/2017   Procedure: SENTINEL NODE BIOPSY;  Surgeon: [name], MD;  Location: [facility];  Service: Gynecology;  Laterality: N/A;  . TONSILLECTOMY    . TOTAL HIP ARTHROPLASTY    . TUBAL LIGATION      Current Outpatient Medications  Medication Sig Dispense  Refill  . acetaminophen (TYLENOL) 650 MG CR tablet Take 1,950 mg by mouth in the morning and at bedtime.    . chlorthalidone (HYGROTON) 25 MG tablet Take 1 tablet (25 mg total) by mouth daily. 90 tablet 1  . losartan (COZAAR) 100 MG tablet Take 1 tablet (100 mg total) by mouth daily. 90 tablet 1  . metoprolol tartrate (LOPRESSOR) 25 MG tablet Take 1 tablet (25 mg total) by mouth 2 (two) times daily. 180 tablet 1  . omeprazole (PRILOSEC) 20 MG capsule Take 1 capsule (20 mg total) by mouth daily. 30 capsule 1  . rosuvastatin (CRESTOR) 10 MG tablet Take 1 tablet (10 mg total) by mouth every evening. 90 tablet 0  . XARELTO 20 MG TABS tablet TAKE ONE (1) TABLET ONCE DAILY WITH SUPPER 90 tablet 1   No current facility-administered medications for this encounter.    No Known Allergies  Social History   Socioeconomic History  . Marital status: Married    Spouse name: Not on file  . Number of children: Not on file  . Years of education: Not on file  . Highest education level: Not on file  Occupational History  . Not on file  Tobacco Use  . Smoking status: Never Smoker  . Smokeless tobacco: Never Used  Vaping Use  . Vaping Use: Never used  Substance and Sexual Activity  . Alcohol use: Yes    Comment: occasional social  . Drug use: Never  . Sexual activity: Not on file  Other Topics Concern  .  Not on file  Social History Narrative  . Not on file   Social Determinants of Health   Financial Resource Strain: Not on file  Food Insecurity: Not on file  Transportation Needs: Not on file  Physical Activity: Not on file  Stress: Not on file  Social Connections: Not on file  Intimate Partner Violence: Not on file    Family History  Problem Relation Age of Onset  . Aneurysm Mother   . Emphysema Father   . Cancer Paternal Uncle     ROS- All systems are reviewed and negative except as per the HPI above  Physical Exam: Vitals:   06/12/20 0850  BP: 134/82  Pulse: 71  Weight:  118.5 kg  Height: 5\' 2"  (1.575 m)   Wt Readings from Last 3 Encounters:  06/12/20 118.5 kg  05/31/20 116.8 kg  05/15/20 114 kg    Labs: Lab Results  Component Value Date   NA 142 06/11/2020   K 5.0 06/11/2020   CL 98 06/11/2020   CO2 30 (H) 06/11/2020   GLUCOSE 107 (H) 06/11/2020   BUN 18 06/11/2020   CREATININE 0.66 06/11/2020   CALCIUM 9.9 06/11/2020   No results found for: INR Lab Results  Component Value Date   CHOL 170 05/31/2020   HDL 59 05/31/2020   LDLCALC 91 05/31/2020   TRIG 112 05/31/2020     GEN- The patient is well appearing, alert and oriented x 3 today.   Head- normocephalic, atraumatic Eyes-  Sclera clear, conjunctiva pink Ears- hearing intact Oropharynx- clear Neck- supple, no JVP Lymph- no cervical lymphadenopathy Lungs- Clear to ausculation bilaterally, normal work of breathing Heart- Regular rate and rhythm, no murmurs, rubs or gallops, PMI not laterally displaced GI- soft, NT, ND, + BS Extremities- no clubbing, cyanosis, or edema MS- no significant deformity or atrophy Skin- no rash or lesion Psych- euthymic mood, full affect Neuro- strength and sensation are intact  EKG-NSR at 71 bpm, pr int 152 ms,     Assessment and Plan: 1. Paroxsymal afib  Is maintaining  SR post procedure  Continue metoprolol 25 mg bid   2. Abdominal gas/belching irritation with swallowing since procedure  Will place on Prilosec 20 mg qd x one month with one refill If does not improve, see PCP or GI   3. HTN  Stable   F/u with Dr. Curt Bears as scheduled 7/11  Geroge Baseman. Milianna Ericsson, [facility] [address] [place], [place] [address] [phone]

## 2020-06-14 ENCOUNTER — Other Ambulatory Visit: Payer: Self-pay

## 2020-06-14 ENCOUNTER — Ambulatory Visit (HOSPITAL_BASED_OUTPATIENT_CLINIC_OR_DEPARTMENT_OTHER): Payer: PPO | Attending: Cardiology | Admitting: Cardiology

## 2020-06-14 DIAGNOSIS — G4734 Idiopathic sleep related nonobstructive alveolar hypoventilation: Secondary | ICD-10-CM

## 2020-06-14 DIAGNOSIS — R0683 Snoring: Secondary | ICD-10-CM

## 2020-06-14 DIAGNOSIS — G4733 Obstructive sleep apnea (adult) (pediatric): Secondary | ICD-10-CM | POA: Diagnosis not present

## 2020-06-14 DIAGNOSIS — R400 Somnolence: Secondary | ICD-10-CM

## 2020-06-14 DIAGNOSIS — R5383 Other fatigue: Secondary | ICD-10-CM | POA: Diagnosis not present

## 2020-06-17 ENCOUNTER — Other Ambulatory Visit: Payer: Self-pay

## 2020-06-18 ENCOUNTER — Encounter: Payer: Self-pay | Admitting: Family Medicine

## 2020-07-02 ENCOUNTER — Telehealth: Payer: Self-pay | Admitting: Family Medicine

## 2020-07-02 NOTE — Procedures (Signed)
  Patient Name: Helen, West Date: 06/14/2020 Gender: Female D.O.B: 1955/04/28 Age (years): 72 Referring Provider: Will Camnitz Height (inches): 62 Interpreting Physician: [name] MD, ABSM Weight (lbs): 245 RPSGT: Baxter Flattery BMI: 45 MRN: [id number] Neck Size: 16.00  CLINICAL INFORMATION Sleep Study Type: NPSG  Indication for sleep study: Obesity, Snoring, Witnesses Apnea / Gasping During Sleep  Epworth Sleepiness Score: 5  SLEEP STUDY TECHNIQUE As per the AASM Manual for the Scoring of Sleep and Associated Events v2.3 (April 2016) with a hypopnea requiring 4% desaturations.  The channels recorded and monitored were frontal, central and occipital EEG, electrooculogram (EOG), submentalis EMG (chin), nasal and oral airflow, thoracic and abdominal wall motion, anterior tibialis EMG, snore microphone, electrocardiogram, and pulse oximetry.  MEDICATIONS Medications self-administered by patient taken the night of the study : ACETAMINOPHEN, LOSARTAN, METOPROLOL, RIVAROXABAN, ROSUVASTATIN  SLEEP ARCHITECTURE The study was initiated at 10:43:18 PM and ended at 4:45:38 AM.  Sleep onset time was 24.0 minutes and the sleep efficiency was 84.7%. The total sleep time was 306.8 minutes.  Stage REM latency was 92.5 minutes.  The patient spent 1.3% of the night in stage N1 sleep, 78.8% in stage N2 sleep, 0.0% in stage N3 and 19.9% in REM.  Alpha intrusion was absent.  Supine sleep was 53.39%.  RESPIRATORY PARAMETERS The overall apnea/hypopnea index (AHI) was 23.7 per hour. There were 29 total apneas, including 28 obstructive, 1 central and 0 mixed apneas. There were 92 hypopneas and 4 RERAs.  The AHI during Stage REM sleep was 47.2 per hour.  AHI while supine was 20.5 per hour.  The mean oxygen saturation was 90.7%. The minimum SpO2 during sleep was 72.0%.  moderate snoring was noted during this study.  CARDIAC DATA The 2 lead EKG demonstrated sinus rhythm. The mean  heart rate was 61.4 beats per minute. Other EKG findings include: PVCs.  LEG MOVEMENT DATA The total PLMS were 0 with a resulting PLMS index of 0.0. Associated arousal with leg movement index was 0.0 .  IMPRESSIONS - Moderate obstructive sleep apnea occurred during this study (AHI = 23.7/h). - No significant central sleep apnea occurred during this study (CAI = 0.2/h). - Moderate oxygen desaturation was noted during this study (Min O2 = 72.0%). - The patient snored with moderate snoring volume. - EKG findings include PVCs. - Clinically significant periodic limb movements did not occur during sleep. No significant associated arousals.  DIAGNOSIS - Obstructive Sleep Apnea (G47.33) - Nocturnal Hypoxemia (G47.36)  RECOMMENDATIONS - Therapeutic CPAP titration to determine optimal pressure required to alleviate sleep disordered breathing. - Avoid alcohol, sedatives and other CNS depressants that may worsen sleep apnea and disrupt normal sleep architecture. - Sleep hygiene should be reviewed to assess factors that may improve sleep quality. - Weight management and regular exercise should be initiated or continued if appropriate.  [Electronically signed] 07/02/2020 01:08 PM  [name] MD, ABSM Diplomate, American Board of Sleep Medicine

## 2020-07-02 NOTE — Chronic Care Management (AMB) (Signed)
  Chronic Care Management   Note  07/02/2020 Name: CHIDERA DEARCOS MRN: [id number] DOB: September 30, 1955  [name] is a 65 y.o. year old female who is a primary care patient of [name], Kirsten, MD. I reached out to [name] by phone today in response to a referral sent by [name]'s PCP, [name], Kirsten, MD.   Ms. Kasson was given information about Chronic Care Management services today including:  1. CCM service includes personalized support from designated clinical staff supervised by her physician, including individualized plan of care and coordination with other care providers 2. 24/7 contact phone numbers for assistance for urgent and routine care needs. 3. Service will only be billed when office clinical staff spend 20 minutes or more in a month to coordinate care. 4. Only one practitioner may furnish and bill the service in a calendar month. 5. The patient may stop CCM services at any time (effective at the end of the month) by phone call to the office staff.   Patient agreed to services and verbal consent obtained.   Follow up plan:  Tatjana Secretary/administrator

## 2020-07-02 NOTE — Chronic Care Management (AMB) (Signed)
  Chronic Care Management   Outreach Note  07/02/2020 Name: Helen West MRN: [id number] DOB: 1956/01/09  Referred by: [name], MD Reason for referral : No chief complaint on file.   An unsuccessful telephone outreach was attempted today. The patient was referred to the pharmacist for assistance with care management and care coordination.   Follow Up Plan:   Tatjana Dellinger Upstream Scheduler

## 2020-07-05 ENCOUNTER — Telehealth: Payer: Self-pay | Admitting: *Deleted

## 2020-07-05 DIAGNOSIS — R400 Somnolence: Secondary | ICD-10-CM

## 2020-07-05 DIAGNOSIS — R5383 Other fatigue: Secondary | ICD-10-CM

## 2020-07-05 NOTE — Telephone Encounter (Signed)
The patient has been notified of the result and verbalized understanding.  All questions (if any) were answered. Marolyn Hammock, [place] 07/05/2020 9:08 AM   Titration sent to sleep pool.

## 2020-07-05 NOTE — Telephone Encounter (Signed)
-----   Message from Sueanne Margarita, MD sent at 07/02/2020  1:11 PM EDT ----- Please let patient know that they have sleep apnea.  Recommend therapeutic CPAP titration for treatment of patient's sleep disordered breathing.  If unable to perform an in lab titration then initiate ResMed auto CPAP from 4 to 15cm H2O with heated humidity and mask of choice and overnight pulse ox on CPAP.

## 2020-07-16 ENCOUNTER — Telehealth: Payer: Self-pay

## 2020-07-16 NOTE — Telephone Encounter (Signed)
Pt calling for appointment for abd pain. States since cardiac ablation she has had some gas but has worsened over past couple days. She describes pain to be medial upper abd region. Has not had cholecystectomy. States it does worsen with eating. Does get little nausea after coughing, after eating. Has had a couple bouts of diarrhea yesterday and today. Pt states pain has subsided for now. Denies fever and vomiting. Made appointment for first available appointment. Advised pt if pain worsens or begins fever pt should proceed to emergency room.   Harrell Lark 07/16/20 3:44 PM

## 2020-07-17 ENCOUNTER — Ambulatory Visit (INDEPENDENT_AMBULATORY_CARE_PROVIDER_SITE_OTHER): Payer: PPO | Admitting: Physician Assistant

## 2020-07-17 ENCOUNTER — Encounter: Payer: Self-pay | Admitting: Physician Assistant

## 2020-07-17 ENCOUNTER — Other Ambulatory Visit: Payer: Self-pay

## 2020-07-17 VITALS — BP 126/70 | HR 85 | Temp 97.5°F | Ht 62.0 in | Wt 257.6 lb

## 2020-07-17 DIAGNOSIS — R1013 Epigastric pain: Secondary | ICD-10-CM | POA: Diagnosis not present

## 2020-07-17 DIAGNOSIS — R1084 Generalized abdominal pain: Secondary | ICD-10-CM | POA: Diagnosis not present

## 2020-07-17 MED ORDER — DICYCLOMINE HCL 20 MG PO TABS: 20.0000 mg | ORAL_TABLET | Freq: Three times a day (TID) | ORAL | 0 refills | Status: DC

## 2020-07-17 NOTE — Progress Notes (Signed)
Acute Office Visit  Subjective:    Patient ID: Helen West, female    DOB: [birth year], 65 y.o.   MRN: [id number]  Chief Complaint  Patient presents with   Abdominal Pain   Diarrhea    HPI Patient is in today for abdominal pain  Patient states that 2 months ago she had a cardiac ablasion and ever since then she has had midepigastric pain and burning - she was placed on prilosec 60m qd by cardiology about a month ago but states that has not really helped She denies nausea or vomiting - denies melena or hematochezia Patient also states that she is having excessive belching and passing gas - she has noted a changed in her bowel movements as well going from slightly constipated to loose bowels - this is not normal for her Patient last had a colonoscopy around 4 years ago with Dr BMelina Copa Past Medical History:  Diagnosis Date   A-fib ([place]    Anemia    Arthritis    Cancer ([place]    Complication of anesthesia    Loss of vocal/voice x 2 months   Dysrhythmia    Headache    High cholesterol    Obesity    Post-menopausal bleeding     Past Surgical History:  Procedure Laterality Date   ATRIAL FIBRILLATION ABLATION N/A 05/15/2020   Procedure: ATRIAL FIBRILLATION ABLATION;  Surgeon: CConstance Haw MD;  [facility];  Service: Cardiovascular;  Laterality: N/A;   ROBOTIC ASSISTED TOTAL HYSTERECTOMY WITH BILATERAL SALPINGO OOPHERECTOMY Bilateral 11/02/2017   Procedure: XI ROBOTIC ASSISTED TOTAL HYSTERECTOMY WITH BILATERAL SALPINGO OOPHORECTOMY;  Surgeon: REveritt Amber MD;  [facility]: [facility];  Service: Gynecology;  Laterality: Bilateral;   SENTINEL NODE BIOPSY N/A 11/02/2017   Procedure: SENTINEL NODE BIOPSY;  Surgeon: REveritt Amber MD;  Location: [facility];  Service: Gynecology;  Laterality: N/A;   TONSILLECTOMY     TOTAL HIP ARTHROPLASTY     TUBAL LIGATION      Family History  Problem Relation Age of Onset   Aneurysm Mother    Emphysema Father    Cancer Paternal  Uncle     Social History   Socioeconomic History   Marital status: Married    Spouse name: Not on file   Number of children: Not on file   Years of education: Not on file   Highest education level: Not on file  Occupational History   Not on file  Tobacco Use   Smoking status: Never   Smokeless tobacco: Never  Vaping Use   Vaping Use: Never used  Substance and Sexual Activity   Alcohol use: Yes    Comment: occasional social   Drug use: Never   Sexual activity: Not on file  Other Topics Concern   Not on file  Social History Narrative   Not on file   Social Determinants of Health   Financial Resource Strain: Not on file  Food Insecurity: Not on file  Transportation Needs: Not on file  Physical Activity: Not on file  Stress: Not on file  Social Connections: Not on file  Intimate Partner Violence: Not on file    Outpatient Medications Prior to Visit  Medication Sig Dispense Refill   acetaminophen (TYLENOL) 650 MG CR tablet Take 1,950 mg by mouth in the morning and at bedtime.     chlorthalidone (HYGROTON) 25 MG tablet Take 1 tablet (25 mg total) by mouth daily. 90 tablet 1   losartan (COZAAR) 100  MG tablet Take 1 tablet (100 mg total) by mouth daily. 90 tablet 1   metoprolol tartrate (LOPRESSOR) 25 MG tablet Take 1 tablet (25 mg total) by mouth 2 (two) times daily. 180 tablet 1   omeprazole (PRILOSEC) 20 MG capsule Take 1 capsule (20 mg total) by mouth daily. 30 capsule 1   rosuvastatin (CRESTOR) 10 MG tablet Take 1 tablet (10 mg total) by mouth every evening. 90 tablet 0   XARELTO 20 MG TABS tablet TAKE ONE (1) TABLET ONCE DAILY WITH SUPPER 90 tablet 1   No facility-administered medications prior to visit.    No Known Allergies  Review of Systems    CONSTITUTIONAL: Negative for chills, fatigue, fever, unintentional weight gain and unintentional weight loss.  E/N/T: Negative for ear pain, nasal congestion and sore throat.  CARDIOVASCULAR: Negative for chest pain,  dizziness, palpitations and pedal edema.  RESPIRATORY: Negative for recent cough and dyspnea.  GASTROINTESTINAL: see HPI MSK: Negative for arthralgias and myalgias.  INTEGUMENTARY: Negative for rash.  NEUROLOGICAL: Negative for dizziness and headaches.  PSYCHIATRIC: Negative for sleep disturbance and to question depression screen.  Negative for depression, negative for anhedonia.      Objective:    Physical Exam PHYSICAL EXAM:   VS: BP 126/70 (BP Location: Right Arm, Patient Position: Sitting, Cuff Size: Normal)   Pulse 85   Temp (!) 97.5 F (36.4 C) (Temporal)   Ht 5' 2"  (1.575 m)   Wt 257 lb 9.6 oz (116.8 kg)   SpO2 98%   BMI 47.12 kg/m   GEN: Well nourished, well developed, in no acute distress  Cardiac: RRR; no murmurs, rubs, or gallops, Respiratory:  normal respiratory rate and pattern with no distress - normal breath sounds with no rales, rhonchi, wheezes or rubs GI: normal bowel sounds - mild epigastric tenderness  Psych: euthymic mood, appropriate affect and demeanor  BP 126/70 (BP Location: Right Arm, Patient Position: Sitting, Cuff Size: Normal)   Pulse 85   Temp (!) 97.5 F (36.4 C) (Temporal)   Ht 5' 2"  (1.575 m)   Wt 257 lb 9.6 oz (116.8 kg)   SpO2 98%   BMI 47.12 kg/m  Wt Readings from Last 3 Encounters:  07/17/20 257 lb 9.6 oz (116.8 kg)  06/14/20 245 lb (111.1 kg)  06/12/20 261 lb 3.2 oz (118.5 kg)    Health Maintenance Due  Topic Date Due   Hepatitis C Screening  Never done   Zoster Vaccines- Shingrix (1 of 2) Never done   COVID-19 Vaccine (4 - Booster for Moderna series) 01/09/2020    There are no preventive care reminders to display for this patient.   Lab Results  Component Value Date   TSH 2.440 02/13/2020   Lab Results  Component Value Date   WBC 6.1 05/31/2020   HGB 15.1 05/31/2020   HCT 45.9 05/31/2020   MCV 95 05/31/2020   PLT 256 05/31/2020   Lab Results  Component Value Date   NA 142 06/11/2020   K 5.0 06/11/2020   CO2  30 (H) 06/11/2020   GLUCOSE 107 (H) 06/11/2020   BUN 18 06/11/2020   CREATININE 0.66 06/11/2020   BILITOT 0.3 06/11/2020   ALKPHOS 91 06/11/2020   AST 20 06/11/2020   ALT 15 06/11/2020   PROT 6.9 06/11/2020   ALBUMIN 4.6 06/11/2020   CALCIUM 9.9 06/11/2020   ANIONGAP 8 11/03/2017   EGFR 96 06/11/2020   Lab Results  Component Value Date   CHOL 170 05/31/2020  Lab Results  Component Value Date   HDL 59 05/31/2020   Lab Results  Component Value Date   LDLCALC 91 05/31/2020   Lab Results  Component Value Date   TRIG 112 05/31/2020   Lab Results  Component Value Date   CHOLHDL 2.9 05/31/2020   No results found for: HGBA1C     Assessment & Plan:  1. Epigastric pain - CBC with Differential/Platelet - Comprehensive metabolic panel - Helicobacter pylori abs-IgG+IgA, bld Continue prilosec  2. Generalized abdominal pain - dicyclomine (BENTYL) 20 MG tablet; Take 1 tablet (20 mg total) by mouth 3 (three) times daily before meals.  Dispense: 90 tablet; Refill: 0    Meds ordered this encounter  Medications   dicyclomine (BENTYL) 20 MG tablet    Sig: Take 1 tablet (20 mg total) by mouth 3 (three) times daily before meals.    Dispense:  90 tablet    Refill:  0    Order Specific Question:   Supervising Provider    AnswerShelton Silvas    Orders Placed This Encounter  Procedures   CBC with Differential/Platelet   Comprehensive metabolic panel   Helicobacter pylori abs-IgG+IgA, bld     Follow-up: Return if symptoms worsen or fail to improve.  An After Visit Summary was printed and given to the patient.  Yetta Flock [name] [phone]

## 2020-07-18 LAB — CBC WITH DIFFERENTIAL/PLATELET
Basophils Absolute: 0 10*3/uL (ref 0.0–0.2)
Basos: 1 %
EOS (ABSOLUTE): 0.2 10*3/uL (ref 0.0–0.4)
Eos: 3 %
Hematocrit: 42.6 % (ref 34.0–46.6)
Hemoglobin: 13.7 g/dL (ref 11.1–15.9)
Immature Grans (Abs): 0 10*3/uL (ref 0.0–0.1)
Immature Granulocytes: 0 %
Lymphocytes Absolute: 1.7 10*3/uL (ref 0.7–3.1)
Lymphs: 26 %
MCH: 31.3 pg (ref 26.6–33.0)
MCHC: 32.2 g/dL (ref 31.5–35.7)
MCV: 97 fL (ref 79–97)
Monocytes Absolute: 0.5 10*3/uL (ref 0.1–0.9)
Monocytes: 8 %
Neutrophils Absolute: 4 10*3/uL (ref 1.4–7.0)
Neutrophils: 62 %
Platelets: 277 10*3/uL (ref 150–450)
RBC: 4.38 x10E6/uL (ref 3.77–5.28)
RDW: 12.2 % (ref 11.7–15.4)
WBC: 6.4 10*3/uL (ref 3.4–10.8)

## 2020-07-18 LAB — HELICOBACTER PYLORI ABS-IGG+IGA, BLD
H. pylori, IgA Abs: <9 units (ref 0.0–8.9)
H. pylori, IgG AbS: 0.17 Index Value (ref 0.00–0.79)

## 2020-07-18 LAB — COMPREHENSIVE METABOLIC PANEL
ALT: 12 IU/L (ref 0–32)
AST: 20 IU/L (ref 0–40)
Albumin/Globulin Ratio: 1.8 (ref 1.2–2.2)
Albumin: 4.4 g/dL (ref 3.8–4.8)
Alkaline Phosphatase: 93 IU/L (ref 44–121)
BUN/Creatinine Ratio: 24 (ref 12–28)
BUN: 19 mg/dL (ref 8–27)
Bilirubin Total: 0.3 mg/dL (ref 0.0–1.2)
CO2: 28 mmol/L (ref 20–29)
Calcium: 10.2 mg/dL (ref 8.7–10.3)
Chloride: 98 mmol/L (ref 96–106)
Creatinine, Ser: 0.8 mg/dL (ref 0.57–1.00)
Globulin, Total: 2.5 g/dL (ref 1.5–4.5)
Glucose: 117 mg/dL — ABNORMAL HIGH (ref 65–99)
Potassium: 5.2 mmol/L (ref 3.5–5.2)
Sodium: 143 mmol/L (ref 134–144)
Total Protein: 6.9 g/dL (ref 6.0–8.5)
eGFR: 81 mL/min/{1.73_m2} (ref >59)

## 2020-07-19 ENCOUNTER — Other Ambulatory Visit: Payer: Self-pay | Admitting: Physician Assistant

## 2020-07-19 MED ORDER — PANTOPRAZOLE SODIUM 40 MG PO TBEC: 40.0000 mg | DELAYED_RELEASE_TABLET | Freq: Every day | ORAL | 3 refills | Status: DC

## 2020-07-22 ENCOUNTER — Ambulatory Visit: Payer: PPO | Admitting: Cardiology

## 2020-07-23 NOTE — Telephone Encounter (Signed)
Prior Authorization for titration sent to [facility] via Fax [phone].

## 2020-07-29 ENCOUNTER — Ambulatory Visit (INDEPENDENT_AMBULATORY_CARE_PROVIDER_SITE_OTHER): Payer: PPO | Admitting: Physician Assistant

## 2020-07-29 ENCOUNTER — Encounter: Payer: Self-pay | Admitting: Physician Assistant

## 2020-07-29 ENCOUNTER — Other Ambulatory Visit (HOSPITAL_COMMUNITY): Payer: Self-pay | Admitting: Nurse Practitioner

## 2020-07-29 ENCOUNTER — Other Ambulatory Visit: Payer: Self-pay

## 2020-07-29 VITALS — BP 120/74 | HR 63 | Temp 97.0°F | Ht 62.0 in | Wt 250.4 lb

## 2020-07-29 DIAGNOSIS — R1084 Generalized abdominal pain: Secondary | ICD-10-CM | POA: Insufficient documentation

## 2020-07-29 DIAGNOSIS — R1013 Epigastric pain: Secondary | ICD-10-CM | POA: Insufficient documentation

## 2020-07-29 NOTE — Progress Notes (Signed)
Subjective:  Patient ID: Helen West, female    DOB: 1955/05/11  Age: 65 y.o. MRN: [id number]  Chief Complaint  Patient presents with   Follow-up    HPI  Pt in for follow up of abdominal pain - pt states that she still is feeling like she is having a lot of gas and burping and when she eats she has midepigastric burning and sharp pains in lower part of chest We had changed her from prilosec to protonix but she cannot tell a big difference She also was put on bentyl to help with bowel movements/gassiness and she had misunderstood and accidentally stopped the bentyl - recommend to restart Recent labwork was negative including negative Hpylori  Current Outpatient Medications on File Prior to Visit  Medication Sig Dispense Refill   acetaminophen (TYLENOL) 650 MG CR tablet Take 1,950 mg by mouth in the morning and at bedtime.     chlorthalidone (HYGROTON) 25 MG tablet Take 1 tablet (25 mg total) by mouth daily. 90 tablet 1   dicyclomine (BENTYL) 20 MG tablet Take 1 tablet (20 mg total) by mouth 3 (three) times daily before meals. 90 tablet 0   losartan (COZAAR) 100 MG tablet Take 1 tablet (100 mg total) by mouth daily. 90 tablet 1   metoprolol tartrate (LOPRESSOR) 25 MG tablet Take 1 tablet (25 mg total) by mouth 2 (two) times daily. 180 tablet 1   pantoprazole (PROTONIX) 40 MG tablet Take 1 tablet (40 mg total) by mouth daily. 30 tablet 3   rosuvastatin (CRESTOR) 10 MG tablet Take 1 tablet (10 mg total) by mouth every evening. 90 tablet 0   XARELTO 20 MG TABS tablet TAKE ONE (1) TABLET ONCE DAILY WITH SUPPER 90 tablet 1   No current facility-administered medications on file prior to visit.   Past Medical History:  Diagnosis Date   A-fib ([place])    Anemia    Arthritis    Cancer ([place])    Complication of anesthesia    Loss of vocal/voice x 2 months   Dysrhythmia    Headache    High cholesterol    Obesity    Post-menopausal bleeding    Past Surgical History:  Procedure Laterality  Date   ATRIAL FIBRILLATION ABLATION N/A 05/15/2020   Procedure: ATRIAL FIBRILLATION ABLATION;  Surgeon: [name], MD;  Location: [facility];  Service: Cardiovascular;  Laterality: N/A;   ROBOTIC ASSISTED TOTAL HYSTERECTOMY WITH BILATERAL SALPINGO OOPHERECTOMY Bilateral 11/02/2017   Procedure: XI ROBOTIC ASSISTED TOTAL HYSTERECTOMY WITH BILATERAL SALPINGO OOPHORECTOMY;  Surgeon: [name], MD;  Location: [facility];  Service: Gynecology;  Laterality: Bilateral;   SENTINEL NODE BIOPSY N/A 11/02/2017   Procedure: SENTINEL NODE BIOPSY;  Surgeon: [name], MD;  Location: [facility];  Service: Gynecology;  Laterality: N/A;   TONSILLECTOMY     TOTAL HIP ARTHROPLASTY     TUBAL LIGATION      Family History  Problem Relation Age of Onset   Aneurysm Mother    Emphysema Father    Cancer Paternal Uncle    Social History   Socioeconomic History   Marital status: Married    Spouse name: Not on file   Number of children: Not on file   Years of education: Not on file   Highest education level: Not on file  Occupational History   Not on file  Tobacco Use   Smoking status: Never   Smokeless tobacco: Never  Vaping Use   Vaping Use: Never  used  Substance and Sexual Activity   Alcohol use: Yes    Comment: occasional social   Drug use: Never   Sexual activity: Not on file  Other Topics Concern   Not on file  Social History Narrative   Not on file   Social Determinants of Health   Financial Resource Strain: Not on file  Food Insecurity: Not on file  Transportation Needs: Not on file  Physical Activity: Not on file  Stress: Not on file  Social Connections: Not on file    Review of Systems CONSTITUTIONAL: Negative for chills, fatigue, fever, unintentional weight gain and unintentional weight loss.  CARDIOVASCULAR: Negative for chest pain, dizziness, palpitations and pedal edema.  RESPIRATORY: Negative for recent cough and dyspnea.  GASTROINTESTINAL: see HPI PSYCHIATRIC:  Negative for sleep disturbance and to question depression screen.  Negative for depression, negative for anhedonia.       Objective:  BP 120/74 (BP Location: Left Arm, Patient Position: Sitting, Cuff Size: Normal)   Pulse 63   Temp (!) 97 F (36.1 C) (Temporal)   Ht 5\' 2"  (1.575 m)   Wt 250 lb 6.4 oz (113.6 kg)   SpO2 96%   BMI 45.80 kg/m   BP/Weight 07/29/2020 07/17/2020 0/38/3338  Systolic BP 329 191 -  Diastolic BP 74 70 -  Wt. (Lbs) 250.4 257.6 245  BMI 45.8 47.12 44.81    Physical Exam PHYSICAL EXAM:   VS: BP 120/74 (BP Location: Left Arm, Patient Position: Sitting, Cuff Size: Normal)   Pulse 63   Temp (!) 97 F (36.1 C) (Temporal)   Ht 5\' 2"  (1.575 m)   Wt 250 lb 6.4 oz (113.6 kg)   SpO2 96%   BMI 45.80 kg/m   GEN: Well nourished, well developed, in no acute distress  Cardiac: RRR; no murmurs, rubs, or gallops,no edema  Respiratory:  normal respiratory rate and pattern with no distress - normal breath sounds with no rales, rhonchi, wheezes or rubs GI: normal bowel sounds, no masses or tenderness Skin: warm and dry, no rash  Psych: euthymic mood, appropriate affect and demeanor  EKG normal Diabetic Foot Exam - Simple   No data filed      Lab Results  Component Value Date   WBC 6.4 07/17/2020   HGB 13.7 07/17/2020   HCT 42.6 07/17/2020   PLT 277 07/17/2020   GLUCOSE 117 (H) 07/17/2020   CHOL 170 05/31/2020   TRIG 112 05/31/2020   HDL 59 05/31/2020   LDLCALC 91 05/31/2020   ALT 12 07/17/2020   AST 20 07/17/2020   NA 143 07/17/2020   K 5.2 07/17/2020   CL 98 07/17/2020   CREATININE 0.80 07/17/2020   BUN 19 07/17/2020   CO2 28 07/17/2020   TSH 2.440 02/13/2020      Assessment & Plan:   1. Epigastric pain - EKG 12-Lead Referral to Dr Melina Copa 2. Generalized abdominal pain  Restart bentyl as directed No orders of the defined types were placed in this encounter.   Orders Placed This Encounter  Procedures   EKG 12-Lead     Follow-up:  Return for as scheduled with Dr Tobie Poet.  An After Visit Summary was printed and given to the patient.  Yetta Flock [name] [phone]

## 2020-07-30 DIAGNOSIS — R1310 Dysphagia, unspecified: Secondary | ICD-10-CM | POA: Diagnosis not present

## 2020-08-02 NOTE — Addendum Note (Signed)
Addended by: Freada Bergeron on: 08/02/2020 01:40 PM   Modules accepted: Orders

## 2020-08-02 NOTE — Telephone Encounter (Signed)
Ok to schedule titration valid dates are 08-16-20/ 11/14/20 aut #[id number].

## 2020-08-06 ENCOUNTER — Other Ambulatory Visit (HOSPITAL_COMMUNITY): Payer: Self-pay | Admitting: Nurse Practitioner

## 2020-08-06 ENCOUNTER — Other Ambulatory Visit: Payer: Self-pay | Admitting: Physician Assistant

## 2020-08-07 NOTE — Telephone Encounter (Signed)
Patient is scheduled for lab study on 10/10/20. Patient understands her sleep study will be done at [facility] sleep lab. Patient understands she will receive a sleep packet in a week or so. Patient understands to call if she does not receive the sleep packet in a timely manner. Patient agrees with treatment and thanked me for call.

## 2020-08-09 ENCOUNTER — Encounter: Payer: Self-pay | Admitting: Radiology

## 2020-08-12 ENCOUNTER — Other Ambulatory Visit: Payer: Self-pay

## 2020-08-12 ENCOUNTER — Encounter: Payer: Self-pay | Admitting: Cardiology

## 2020-08-12 ENCOUNTER — Ambulatory Visit: Payer: PPO | Admitting: Cardiology

## 2020-08-12 VITALS — BP 133/81 | HR 65 | Ht 62.0 in | Wt 261.0 lb

## 2020-08-12 DIAGNOSIS — I4819 Other persistent atrial fibrillation: Secondary | ICD-10-CM | POA: Diagnosis not present

## 2020-08-12 NOTE — Patient Instructions (Signed)
Medication Instructions:  Your physician recommends that you continue on your current medications as directed. Please refer to the Current Medication list given to you today.  *If you need a refill on your cardiac medications before your next appointment, please call your pharmacy*   Lab Work: None ordered   Testing/Procedures: None ordered   Follow-Up: At [facility], you and your health needs are our priority.  As part of our continuing mission to provide you with exceptional heart care, we have created designated Provider Care Teams.  These Care Teams include your primary Cardiologist (physician) and Advanced Practice Providers (APPs -  Physician Assistants and Nurse Practitioners) who all work together to provide you with the care you need, when you need it.  We recommend signing up for the patient portal called "MyChart".  Sign up information is provided on this After Visit Summary.  MyChart is used to connect with patients for Virtual Visits (Telemedicine).  Patients are able to view lab/test results, encounter notes, upcoming appointments, etc.  Non-urgent messages can be sent to your provider as well.   To learn more about what you can do with MyChart, go to [email or web address].    Your next appointment:   3 month(s)  The format for your next appointment:   In Person  Provider:   Allegra Lai, MD    Thank you for choosing [facility]!!   Trinidad Curet, RN [phone]

## 2020-08-12 NOTE — Progress Notes (Signed)
Electrophysiology Office Note   Date:  08/12/2020   ID:  Helen West, DOB 03-26-1955, MRN [id number]  PCP:  [name], MD  Cardiologist:  Debara Pickett Primary Electrophysiologist:  Karlissa Aron Meredith Leeds, MD    Chief Complaint: palpitations   History of Present Illness: Helen West is a 65 y.o. female who is being seen today for the evaluation of atrial fibrillation at the request of [name], [name], MD. Presenting today for electrophysiology evaluation.  She has a history significant for atrial fibrillation, hyperlipidemia, and obesity.  She had continued episodes of atrial fibrillation despite the use of flecainide and was started on Multaq.  She is now status post A. fib ablation 05/15/2020.  Today, denies symptoms of palpitations, chest pain, shortness of breath, orthopnea, PND, lower extremity edema, claudication, dizziness, presyncope, syncope, bleeding, or neurologic sequela. The patient is tolerating medications without difficulties.  Since her ablation she has done well.  She has had no chest pain or shortness of breath.  She is able to do all of her daily activities without restriction.  She does continue to have some fatigue.  She does have sleep apnea, but has not yet gotten her CPAP.   Past Medical History:  Diagnosis Date   A-fib ([place])    Anemia    Arthritis    Cancer ([place])    Complication of anesthesia    Loss of vocal/voice x 2 months   Dysrhythmia    Headache    High cholesterol    History of radiation therapy 01/12/2018   11/14, 11/21, 11/27, 12/4, 01/12/18:  Vaginal cuff, 6 Gy in 5 fractions for a total dose of 30 Gy  Dr Gery Pray   Obesity    Post-menopausal bleeding    Past Surgical History:  Procedure Laterality Date   ATRIAL FIBRILLATION ABLATION N/A 05/15/2020   Procedure: ATRIAL FIBRILLATION ABLATION;  Surgeon: [name], MD;  Location: [facility];  Service: Cardiovascular;  Laterality: N/A;   ROBOTIC ASSISTED TOTAL HYSTERECTOMY  WITH BILATERAL SALPINGO OOPHERECTOMY Bilateral 11/02/2017   Procedure: XI ROBOTIC ASSISTED TOTAL HYSTERECTOMY WITH BILATERAL SALPINGO OOPHORECTOMY;  Surgeon: [name], MD;  Location: [facility];  Service: Gynecology;  Laterality: Bilateral;   SENTINEL NODE BIOPSY N/A 11/02/2017   Procedure: SENTINEL NODE BIOPSY;  Surgeon: [name], MD;  Location: [facility];  Service: Gynecology;  Laterality: N/A;   TONSILLECTOMY     TOTAL HIP ARTHROPLASTY     TUBAL LIGATION       Current Outpatient Medications  Medication Sig Dispense Refill   acetaminophen (TYLENOL) 650 MG CR tablet Take 1,950 mg by mouth in the morning and at bedtime.     chlorthalidone (HYGROTON) 25 MG tablet Take 1 tablet (25 mg total) by mouth daily. 90 tablet 1   dicyclomine (BENTYL) 20 MG tablet Take 1 tablet (20 mg total) by mouth 3 (three) times daily before meals. 90 tablet 0   losartan (COZAAR) 100 MG tablet Take 1 tablet (100 mg total) by mouth daily. 90 tablet 1   metoprolol tartrate (LOPRESSOR) 25 MG tablet Take 1 tablet (25 mg total) by mouth 2 (two) times daily. 180 tablet 1   pantoprazole (PROTONIX) 40 MG tablet Take 1 tablet (40 mg total) by mouth daily. 30 tablet 3   rosuvastatin (CRESTOR) 10 MG tablet TAKE 1 TABLET BY MOUTH EVERY DAY IN THE EVENING. 90 tablet 0   XARELTO 20 MG TABS tablet TAKE ONE (1) TABLET ONCE DAILY WITH SUPPER 90 tablet 1  No current facility-administered medications for this visit.    Allergies:   Patient has no known allergies.   Social History:  The patient  reports that she has never smoked. She has never used smokeless tobacco. She reports current alcohol use. She reports that she does not use drugs.   Family History:  The patient's family history includes Aneurysm in her mother; Cancer in her paternal uncle; Emphysema in her father.   ROS:  Please see the history of present illness.   Otherwise, review of systems is positive for none.   All other systems are reviewed and negative.    PHYSICAL EXAM: VS:  BP 133/81   Pulse 65   Ht 5\' 2"  (1.575 m)   Wt 261 lb (118.4 kg)   SpO2 93%   BMI 47.74 kg/m  , BMI Body mass index is 47.74 kg/m. GEN: Well nourished, well developed, in no acute distress  HEENT: normal  Neck: no JVD, carotid bruits, or masses Cardiac: RRR; no murmurs, rubs, or gallops,no edema  Respiratory:  clear to auscultation bilaterally, normal work of breathing GI: soft, nontender, nondistended, + BS MS: no deformity or atrophy  Skin: warm and dry Neuro:  Strength and sensation are intact Psych: euthymic mood, full affect  EKG:  EKG is ordered today. Personal review of the ekg ordered shows sinus rhythm, rate 65  Recent Labs: 02/13/2020: TSH 2.440 07/17/2020: ALT 12; BUN 19; Creatinine, Ser 0.80; Hemoglobin 13.7; Platelets 277; Potassium 5.2; Sodium 143    Lipid Panel     Component Value Date/Time   CHOL 170 05/31/2020 0833   TRIG 112 05/31/2020 0833   HDL 59 05/31/2020 0833   CHOLHDL 2.9 05/31/2020 0833   LDLCALC 91 05/31/2020 0833     Wt Readings from Last 3 Encounters:  08/12/20 261 lb (118.4 kg)  07/29/20 250 lb 6.4 oz (113.6 kg)  07/17/20 257 lb 9.6 oz (116.8 kg)      Other studies Reviewed: Additional studies/ records that were reviewed today include: Monitor 01/17/19  Review of the above records today demonstrates:  Monitor shows PAF/flutter with possible aberrancy vs. NSVT (up to 12 beats).   ASSESSMENT AND PLAN:  1.  Persistent atrial fibrillation: Currently on Xarelto.  CHA2DS2-VASc of 2.  Is status post ablation 05/15/2020.  She has remained in sinus rhythm.  She has had no further episodes of atrial fibrillation.  We Korion Cuevas continue with current management.  2.  Hypertension: Currently well controlled  3.  Obstructive sleep apnea: CPAP compliance encouraged  4.  Morbid obesity: Diet and exercise encouraged  Current medicines are reviewed at length with the patient today.   The patient does not have concerns  regarding her medicines.  The following changes were made today: None  Labs/ tests ordered today include:  Orders Placed This Encounter  Procedures   EKG 12-Lead     Disposition:   FU with Tonea Leiphart 3 months  Signed, Kimala Horne Meredith Leeds, MD  08/12/2020 8:55 AM     [address] [place] [address] [phone] (office) [phone] (fax)

## 2020-08-13 NOTE — Progress Notes (Signed)
Radiation Oncology         [phone]) [phone] ________________________________  Name: Helen West MRN: [id number]  Date: 08/15/2020  DOB: [birth year]  Follow-Up Visit Note  CC: [name], MD  [name], MD    ICD-10-CM   1. Endometrial cancer ([place])  C54.1     2. 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 The patient followed up with Dr. Denman George last on 02/15/20 in which no evidence of disease recurrence was found upon examination.               On 03/13/20 the patient received a bilateral mammogram at [facility] revealing no findings suspicious for malignancy.  The patient received a cardiac CT on 05/08/20 prior to scheduled ablation. Findings showed normal pulmonary vein drainage into the left atrium as well as no thrombus in the left atrial appendage.  The patient underwent an a-fib ablation on 05/15/20 under Dr. Curt Bears at [facility] [place].   The patient most recently followed up with Dr. Curt Bears on 08/12/20 in which it was noted that she has had no further episodes of a-fib    Of note: the patient received a dexa scan for bone mineral density  on 03/13/20 at [facility]. Results revealed a T-score of -1.3; showing that the patient is considered osteopenic.         She denies any pelvic pain or vaginal bleeding.  She does report some abdominal bloating soon after eating but is limited to meals.  She denies any hematuria or rectal bleeding.       Allergies:  has No Known Allergies.  Meds: Current Outpatient Medications  Medication Sig Dispense Refill    acetaminophen (TYLENOL) 650 MG CR tablet Take 1,950 mg by mouth in the morning and at bedtime.     chlorthalidone (HYGROTON) 25 MG tablet Take 1 tablet (25 mg total) by mouth daily. 90 tablet 1   dicyclomine (BENTYL) 20 MG tablet Take 1 tablet (20 mg total) by mouth 3 (three) times daily before meals. 90 tablet 0   losartan (COZAAR) 100 MG tablet Take 1 tablet (100 mg total) by mouth daily. 90 tablet 1   metoprolol tartrate (LOPRESSOR) 25 MG tablet Take 1 tablet (25 mg total) by mouth 2 (two) times daily. 180 tablet 1   pantoprazole (PROTONIX) 40 MG tablet Take 1 tablet (40 mg total) by mouth daily. 30 tablet 3   rosuvastatin (CRESTOR) 10 MG tablet TAKE 1 TABLET BY MOUTH EVERY DAY IN THE EVENING. 90 tablet 0   XARELTO 20 MG TABS tablet TAKE ONE (1) TABLET ONCE DAILY WITH SUPPER 90 tablet 1   No current facility-administered medications for this encounter.    Physical Findings: The patient is in no acute distress. Patient is alert and oriented.  height is 5\' 2"  (1.575 m) and weight is 259 lb 7 oz (117.7 kg). Her temporal temperature is 96.8 F (36 C) (abnormal). Her blood pressure is 150/75 (abnormal) and her pulse is 71. Her respiration is 18 and oxygen saturation is 100%. .  No significant changes.  Lungs are clear to auscultation bilaterally. Heart has regular rate and rhythm. No palpable cervical, supraclavicular, or axillary adenopathy. Abdomen soft, non-tender, normal bowel sounds.  On pelvic examination the external genitalia are unremarkable.  A speculum exam is performed.  There are no mucosal lesions noted in the vaginal vault.  On bimanual examination the vaginal cuff is intact.  No palpable pelvic mass.   Lab Findings: Lab Results  Component Value Date   WBC 6.4 07/17/2020   HGB 13.7 07/17/2020   HCT 42.6 07/17/2020   MCV 97 07/17/2020   PLT 277 07/17/2020    Radiographic Findings: No results found.  Impression:  Stage IB (pT1b, pN0) Invasive Endometrioid Adenocarcinoma,  Grade 1  No evidence of recurrence on clinical exam today.  Patient has stopped using her vaginal dilator but she does not appear to have any adhesions on pelvic exam today.  Plan: Routine follow-up in 1 year.  Patient will follow up with gynecologic oncology in 6 months.   20 minutes of total time was spent for this patient encounter, including preparation, face-to-face counseling with the patient and coordination of care, physical exam, and documentation of the encounter. ____________________________________  Blair Promise, PhD, MD   This document serves as a record of services personally performed by Gery Pray, MD. It was created on his behalf by Roney Mans, a trained medical scribe. The creation of this record is based on the scribe's personal observations and the provider's statements to them. This document has been checked and approved by the attending provider.

## 2020-08-15 ENCOUNTER — Encounter: Payer: Self-pay | Admitting: Radiation Oncology

## 2020-08-15 ENCOUNTER — Other Ambulatory Visit: Payer: Self-pay

## 2020-08-15 ENCOUNTER — Ambulatory Visit
Admission: RE | Admit: 2020-08-15 | Discharge: 2020-08-15 | Disposition: A | Discharge status: Home / Self Care | Payer: PPO | Source: Ambulatory Visit | Attending: Radiation Oncology | Admitting: Radiation Oncology

## 2020-08-15 VITALS — BP 150/75 | HR 71 | Temp 96.8°F | Resp 18 | Ht 62.0 in | Wt 259.4 lb

## 2020-08-15 DIAGNOSIS — C541 Malignant neoplasm of endometrium: Secondary | ICD-10-CM

## 2020-08-15 DIAGNOSIS — Z7901 Long term (current) use of anticoagulants: Secondary | ICD-10-CM | POA: Diagnosis not present

## 2020-08-15 DIAGNOSIS — R140 Abdominal distension (gaseous): Secondary | ICD-10-CM | POA: Diagnosis not present

## 2020-08-15 DIAGNOSIS — I4891 Unspecified atrial fibrillation: Secondary | ICD-10-CM | POA: Diagnosis not present

## 2020-08-15 DIAGNOSIS — Z8542 Personal history of malignant neoplasm of other parts of uterus: Secondary | ICD-10-CM | POA: Insufficient documentation

## 2020-08-15 DIAGNOSIS — Z923 Personal history of irradiation: Secondary | ICD-10-CM | POA: Insufficient documentation

## 2020-08-15 DIAGNOSIS — Z79899 Other long term (current) drug therapy: Secondary | ICD-10-CM | POA: Diagnosis not present

## 2020-08-15 DIAGNOSIS — N8502 Endometrial intraepithelial neoplasia [EIN]: Secondary | ICD-10-CM

## 2020-08-15 DIAGNOSIS — Z08 Encounter for follow-up examination after completed treatment for malignant neoplasm: Secondary | ICD-10-CM | POA: Diagnosis not present

## 2020-08-15 NOTE — Progress Notes (Signed)
Helen West is here today for follow up post radiation to the pelvic.  They completed their radiation on: 01/12/18  Does the patient complain of any of the following:  Pain:Patient denies pain Abdominal bloating: Yes, patient reports having a lot of gas.  Diarrhea/Constipation: constipation  Nausea/Vomiting: nausea in the am, due to increased cough. Vaginal Discharge: no Blood in Urine or Stool: no Urinary Issues (dysuria/incomplete emptying/ incontinence/ increased frequency/urgency): Patient reports having urinary urgency.  Does patient report using vaginal dilator 2-3 times a week and/or sexually active 2-3 weeks: patient no longer uses.  Post radiation skin changes: no   Additional comments if applicable:  Vitals:   18/34/37 0907  BP: (!) 150/75  Pulse: 71  Resp: 18  Temp: (!) 96.8 F (36 C)  TempSrc: Temporal  SpO2: 100%  Weight: 259 lb 7 oz (117.7 kg)  Height: 5\' 2"  (1.575 m)

## 2020-08-20 ENCOUNTER — Telehealth: Payer: Self-pay

## 2020-08-20 NOTE — Progress Notes (Signed)
Chronic Care Management Pharmacy Assistant   Name: Helen West  MRN: [id number] DOB: 14-Jul-1955  Helen West is an 65 y.o. year old female who presents for his initial CCM visit with the clinical pharmacist.    Conditions to be addressed/monitored: Atrial Fibrillation  Recent office visits:  07/29/20-Sara Kathie Rhodes (PCP), epigastric pain, no medication changes, EKG ordered, follow up as scheduled with Dr Tobie Poet  07/17/20-Sara Kathie Rhodes (PCP), abdominal  pain, labs ordered, started on Bentyl 20mg , follow up PRN Lab results: Notify cbc normal Cmp stable (not fasting) Hpylori is negative Recommend to change prilosec to protonix -- will send in rx How is she feeling? Make follow up appt for 1-2 weeks if she does not already have one  05/31/20-Dr [name] PCP, hypertension, labs ordered, Azithromycin was started,  Lab results: Blood count normal.  Liver function normal.  Kidney function normal.  Potassium 5.3. recheck potassium in one week.  Thyroid function normal.  Cholesterol: HBA1C:  04/03/20-Dr [name] PCP, hypertension, started Losartan 100mg , Chorthalidone 25mg , Metoprolol 25mg , follow up 3 months.    Recent consult visits:  08/15/20-Dr Kinard, endometrial cancer follow up, no note available  08/12/20-Dr Hassell Done, Cardiology, afib, EKG ordered, no medication changes, follow up 3 months.  06/14/20-Dr Radford Pax, sleep study  06/12/20-Donna Kayleen Memos NP, Cardiology, afib, EKG ordered, started Omeprazole 20mg , no follow up noted  04/01/20-Dr Hassell Done, Cardiology, afib, EKG and split night study ordered, Multaq was stopped by provider. Follow up 3 months  03/13/20-Dr [name] PCP, hypertension, started Chlorthalidone 25mg , follow up 3 weeks    Hospital visits:  05/15/20-[facility], Afib and Ablation, was discharged same day,   Medications: Outpatient Encounter Medications as of 08/20/2020  Medication Sig   acetaminophen (TYLENOL) 650 MG CR tablet Take 1,950 mg by mouth in the morning and at  bedtime.   chlorthalidone (HYGROTON) 25 MG tablet Take 1 tablet (25 mg total) by mouth daily.   dicyclomine (BENTYL) 20 MG tablet Take 1 tablet (20 mg total) by mouth 3 (three) times daily before meals.   losartan (COZAAR) 100 MG tablet Take 1 tablet (100 mg total) by mouth daily.   metoprolol tartrate (LOPRESSOR) 25 MG tablet Take 1 tablet (25 mg total) by mouth 2 (two) times daily.   pantoprazole (PROTONIX) 40 MG tablet Take 1 tablet (40 mg total) by mouth daily.   rosuvastatin (CRESTOR) 10 MG tablet TAKE 1 TABLET BY MOUTH EVERY DAY IN THE EVENING.   XARELTO 20 MG TABS tablet TAKE ONE (1) TABLET ONCE DAILY WITH SUPPER   No facility-administered encounter medications on file as of 08/20/2020.    No results found for: HGBA1C, MICROALBUR   BP Readings from Last 3 Encounters:  08/15/20 (!) 150/75  08/12/20 133/81  07/29/20 120/74      Have you seen any other providers since your last visit with PCP? No  Any changes in your medications or health? Patient denies any changes  Any side effects from any medications? Patient has not side effects   Do you have an symptoms or problems not managed by your medications? No  Any concerns about your health right now? No  Has your provider asked that you check blood pressure, blood sugar, or follow special diet at home? Patient does check her blood pressure once in a while, she does not check her blood sugars or follow a special diet.   Do you get any type of exercise on a regular basis? Patient stated she stays very active.  Can you think of a goal you would like to reach for your health? No  Do you have any problems getting your medications? Patient denies any issues with the pharmacy   Is there anything that you would like to discuss during the appointment? No    Star Rating Drugs:  Medication:  Last Fill: Day Supply Losartan  07/08/20  90 Metoprolol  08/06/20  90 Rosuvastatin  08/19/20 90 Xarelto   07/29/20 90   Care Gaps: Last  annual wellness visit? None listed  If applicable: Last eye exam / retinopathy screening? None listed  Last diabetic foot exam? None listed   Clarita Leber, [place] Pharmacist Assistant [phone]

## 2020-08-26 DIAGNOSIS — K219 Gastro-esophageal reflux disease without esophagitis: Secondary | ICD-10-CM | POA: Diagnosis not present

## 2020-08-26 DIAGNOSIS — R0789 Other chest pain: Secondary | ICD-10-CM | POA: Diagnosis not present

## 2020-08-26 DIAGNOSIS — R1310 Dysphagia, unspecified: Secondary | ICD-10-CM | POA: Diagnosis not present

## 2020-08-27 ENCOUNTER — Ambulatory Visit: Payer: PPO

## 2020-10-10 ENCOUNTER — Ambulatory Visit (HOSPITAL_BASED_OUTPATIENT_CLINIC_OR_DEPARTMENT_OTHER): Payer: PPO | Attending: Cardiology | Admitting: Cardiology

## 2020-10-10 ENCOUNTER — Other Ambulatory Visit: Payer: Self-pay

## 2020-10-10 VITALS — Ht 62.0 in | Wt 261.0 lb

## 2020-10-10 DIAGNOSIS — G4733 Obstructive sleep apnea (adult) (pediatric): Secondary | ICD-10-CM | POA: Insufficient documentation

## 2020-10-10 DIAGNOSIS — R400 Somnolence: Secondary | ICD-10-CM | POA: Insufficient documentation

## 2020-10-10 DIAGNOSIS — R5383 Other fatigue: Secondary | ICD-10-CM | POA: Insufficient documentation

## 2020-10-13 NOTE — Procedures (Signed)
   Patient Name: Helen West, Helen West Date: 10/10/2020 Gender: Female D.O.B: [birth year] Age (years): 35 Referring Provider: Will Camnitz Height (inches): 62 Interpreting Physician: [name] MD, ABSM Weight (lbs): 261 RPSGT: Zadie Rhine BMI: 48 MRN: [id number] Neck Size: 16.00  CLINICAL INFORMATION The patient is referred for a CPAP titration to treat sleep apnea.  SLEEP STUDY TECHNIQUE As per the AASM Manual for the Scoring of Sleep and Associated Events v2.3 (April 2016) with a hypopnea requiring 4% desaturations.  The channels recorded and monitored were frontal, central and occipital EEG, electrooculogram (EOG), submentalis EMG (chin), nasal and oral airflow, thoracic and abdominal wall motion, anterior tibialis EMG, snore microphone, electrocardiogram, and pulse oximetry. Continuous positive airway pressure (CPAP) was initiated at the beginning of the study and titrated to treat sleep-disordered breathing.  MEDICATIONS Medications self-administered by patient taken the night of the study : ACETAMINOPHEN, LOSARTAN, METOPROLOL, ROSUVASTATIN, HYGROTON, PROTONIX, xarelto  TECHNICIAN COMMENTS Comments added by technician: Pt had one restroom visted. Patient had difficulty initiating sleep. Comments added by scorer: N/A  RESPIRATORY PARAMETERS Optimal PAP Pressure (cm):18  AHI at Optimal Pressure (/hr):0 Overall Minimal O2 (%):87.0  Supine % at Optimal Pressure (%):0 Minimal O2 at Optimal Pressure (%): 93.0   SLEEP ARCHITECTURE The study was initiated at 10:25:44 PM and ended at 4:20:42 AM.  Sleep onset time was 18.8 minutes and the sleep efficiency was 80.4%. The total sleep time was 285.5 minutes.  The patient spent 3.2% of the night in stage N1 sleep, 55.2% in stage N2 sleep, 11.9%% in stage N3 and 29.8% in REM.Stage REM latency was 58.0 minutes  Wake after sleep onset was 50.6. Alpha intrusion was absent. Supine sleep was 22.76%.  CARDIAC DATA The 2 lead EKG  demonstrated sinus rhythm. The mean heart rate was 52.1 beats per minute. Other EKG findings include: None.  LEG MOVEMENT DATA The total Periodic Limb Movements of Sleep (PLMS) were 0. The PLMS index was 0.0. A PLMS index of <15 is considered normal in adults.  IMPRESSIONS - The optimal PAP pressure was 18 cm of water. - Central sleep apnea was not noted during this titration (CAI = 0.2/h). - Mild oxygen desaturations were observed during this titration (min O2 = 87.0%). - No snoring was audible during this study. - No cardiac abnormalities were observed during this study. - Clinically significant periodic limb movements were not noted during this study. Arousals associated with PLMs were rare.  DIAGNOSIS - Obstructive Sleep Apnea (G47.33)  RECOMMENDATIONS - Trial of CPAP therapy on 18 cm H2O with a Small size Resmed Full Face Mask AirFit F20 mask and heated humidification. - Avoid alcohol, sedatives and other CNS depressants that may worsen sleep apnea and disrupt normal sleep architecture. - Sleep hygiene should be reviewed to assess factors that may improve sleep quality. - Weight management and regular exercise should be initiated or continued. - Return to [facility] for re-evaluation after 4 weeks of therapy  [Electronically signed] 10/13/2020 08:26 PM  [name] MD, ABSM Diplomate, American Board of Sleep Medicine

## 2020-10-21 ENCOUNTER — Telehealth: Payer: Self-pay | Admitting: *Deleted

## 2020-10-21 NOTE — Telephone Encounter (Signed)
The patient has been notified of the result and verbalized understanding.  All questions (if any) were answered. Marolyn Hammock, [place] 10/21/2020 1:51 PM    Upon patient request DME selection is [facility] Patient understands he will be contacted by [facility] to set up his cpap. Patient understands to call if [facility] does not contact him with new setup in a timely manner. Patient understands they will be called once confirmation has been received from [facility] that they have received their new machine to schedule 10 week follow up appointment.   [facility] notified of new cpap order  Please add to airview Patient was grateful for the call and thanked me.

## 2020-10-21 NOTE — Telephone Encounter (Signed)
-----   Message from Sueanne Margarita, MD sent at 10/13/2020  8:30 PM EDT ----- Please let patient know that they had a successful PAP titration and let DME know that orders are in EPIC.  Please set up 6 week OV with me.

## 2020-10-29 ENCOUNTER — Telehealth: Payer: Self-pay | Admitting: Cardiology

## 2020-10-29 NOTE — Telephone Encounter (Signed)
Pt reports she feels she went out of rhythm yesterday later afternoon around 5pm.... HR 107.  BP since then 127/96, 134/92, 120/72. HRs avg 110s-120s.....  She went to bed in AFib, but woke up this morning back in NSR, HR 64. Denies any CP, SOB, dizziness/light headedness with episodes yesterday.  Said she did have a HA. Reports compliance with medications, including DOAC, states this is first episode since ablation earlier this year. Denies any recent illness, stressors or ETOH.   She has not received her CPAP yet, several more weeks before it arrive.  Made aware that it will most likely be watchful waiting at this point.   Advised if reoccurs and/or symptoms begin to call office to discuss possible monitor vs OV.    Pt aware I will forward to Dr. Curt Bears for advisement. Aware I will only call back if he has other advisement. Pt completely agreeable to plan.

## 2020-10-29 NOTE — Telephone Encounter (Signed)
Patient c/o Palpitations:  High priority if patient c/o lightheadedness, shortness of breath, or chest pain  How long have you had palpitations/irregular HR/ Afib? Are you having the symptoms now? Yesterday 7 pm it from 152/113 pulse 132. Right no  Are you currently experiencing lightheadedness, SOB or CP? Tired this morning   Do you have a history of afib (atrial fibrillation) or irregular heart rhythm? Yes  Have you checked your BP or HR? (document readings if available): at 6 am 120/172 pulse 64  Are you experiencing any other symptoms? Tired

## 2020-11-15 ENCOUNTER — Telehealth: Payer: Self-pay

## 2020-11-15 DIAGNOSIS — M7062 Trochanteric bursitis, left hip: Secondary | ICD-10-CM | POA: Diagnosis not present

## 2020-11-15 DIAGNOSIS — M549 Dorsalgia, unspecified: Secondary | ICD-10-CM | POA: Diagnosis not present

## 2020-11-15 DIAGNOSIS — Z96641 Presence of right artificial hip joint: Secondary | ICD-10-CM | POA: Diagnosis not present

## 2020-11-15 DIAGNOSIS — M25552 Pain in left hip: Secondary | ICD-10-CM | POA: Diagnosis not present

## 2020-11-15 DIAGNOSIS — M25551 Pain in right hip: Secondary | ICD-10-CM | POA: Diagnosis not present

## 2020-11-15 DIAGNOSIS — M7061 Trochanteric bursitis, right hip: Secondary | ICD-10-CM | POA: Diagnosis not present

## 2020-11-15 NOTE — Chronic Care Management (AMB) (Signed)
    Chronic Care Management Pharmacy Assistant   Name: Helen West  MRN: [id number] DOB: 1955/08/26   Reason for Encounter: General Adherence Call    Recent office visits:  None since 08/20/20  Recent consult visits:  None since 08/20/20  Hospital visits:  None since 08/20/20  Medications: Outpatient Encounter Medications as of 11/15/2020  Medication Sig   acetaminophen (TYLENOL) 650 MG CR tablet Take 1,950 mg by mouth in the morning and at bedtime.   chlorthalidone (HYGROTON) 25 MG tablet Take 1 tablet (25 mg total) by mouth daily.   dicyclomine (BENTYL) 20 MG tablet Take 1 tablet (20 mg total) by mouth 3 (three) times daily before meals.   losartan (COZAAR) 100 MG tablet Take 1 tablet (100 mg total) by mouth daily.   metoprolol tartrate (LOPRESSOR) 25 MG tablet Take 1 tablet (25 mg total) by mouth 2 (two) times daily.   pantoprazole (PROTONIX) 40 MG tablet Take 1 tablet (40 mg total) by mouth daily.   rosuvastatin (CRESTOR) 10 MG tablet TAKE 1 TABLET BY MOUTH EVERY DAY IN THE EVENING.   XARELTO 20 MG TABS tablet TAKE ONE (1) TABLET ONCE DAILY WITH SUPPER   No facility-administered encounter medications on file as of 11/15/2020.    Contacted [name] for general disease state and medication adherence call.   Patient is not > 5 days past due for refill on the following medications per chart history:  Star Medications: Medication Name/mg Last Fill Days Supply Rosuvastatin 10 mg  08/19/20 90ds Losartan 100 mg  10/08/20  90ds   What concerns do you have about your medications? Pt has no concerns   The patient denies side effects with her medications.   How often do you forget or accidentally miss a dose? Never  Do you use a pillbox? Yes  Are you having any problems getting your medications from your pharmacy? No issues   Has the cost of your medications been a concern? No issues   Since last visit with CPP, no interventions have been made:   The patient has  not had an ED visit since last contact.   The patient denies problems with their health.   Patient states BP readings are as follows: Pt stated she has not checked her BP in a long time. She normally checks them when she feels it is high. She stated the last few times it was good.  Per Cardiologist notes on 10/29/20 from patient her BP are as follows: BP since then 127/96, 134/92, 120/72. HRs avg 110s-120s.....     Care Gaps: Last annual wellness visit? Scheduled for 97/67/34 If applicable: N/A Last eye exam / retinopathy screening? Diabetic foot exam?    [name], [place] Pharmacist Assistant  [phone]

## 2020-11-17 NOTE — Progress Notes (Signed)
Electrophysiology Office Note   Date:  11/18/2020   ID:  Helen West, DOB 09-13-1955, MRN [id number]  PCP:  Helen Brome, MD  Cardiologist:  Helen West Primary Electrophysiologist:  Helen Denapoli Meredith Leeds, MD    Chief Complaint: palpitations   History of Present Illness: Helen West is a 65 y.o. female who is being seen today for the evaluation of atrial fibrillation at the request of West, Helen Maxwell, MD. Presenting today for electrophysiology evaluation.  She has a history sending for atrial fibrillation, hyperlipidemia, and obesity.  She also has obstructive sleep apnea on CPAP.  She had continued to have episodes of atrial fibrillation despite use of flecainide and Multaq.  She is now status post atrial fibrillation ablation 05/15/2020.  Today, denies symptoms of palpitations, chest pain, shortness of breath, orthopnea, PND, lower extremity edema, claudication, dizziness, presyncope, syncope, bleeding, or neurologic sequela. The patient is tolerating medications without difficulties.  Overall she is feeling well.  She is unfortunately had 2 episodes of atrial fibrillation since her ablation.  1 occurred while she was quite stressed over a pet who died.  She states that it started approximately 5:00 in the evening.  When she woke up the next morning, she was no longer having atrial fibrillation.  She is mostly comfortable with her control.   Past Medical History:  Diagnosis Date   A-fib ([place])    Anemia    Arthritis    Cancer ([place])    Complication of anesthesia    Loss of vocal/voice x 2 months   Dysrhythmia    Headache    High cholesterol    History of radiation therapy 01/12/2018   11/14, 11/21, 11/27, 12/4, 01/12/18:  Vaginal cuff, 6 Gy in 5 fractions for a total dose of 30 Gy  Dr Helen West   Obesity    Post-menopausal bleeding    Past Surgical History:  Procedure Laterality Date   ATRIAL FIBRILLATION ABLATION N/A 05/15/2020   Procedure: ATRIAL FIBRILLATION ABLATION;   Surgeon: Helen Haw, MD;  Location: [facility];  Service: Cardiovascular;  Laterality: N/A;   ROBOTIC ASSISTED TOTAL HYSTERECTOMY WITH BILATERAL SALPINGO OOPHERECTOMY Bilateral 11/02/2017   Procedure: XI ROBOTIC ASSISTED TOTAL HYSTERECTOMY WITH BILATERAL SALPINGO OOPHORECTOMY;  Surgeon: Helen Amber, MD;  Location: [facility];  Service: Gynecology;  Laterality: Bilateral;   SENTINEL NODE BIOPSY N/A 11/02/2017   Procedure: SENTINEL NODE BIOPSY;  Surgeon: Helen Amber, MD;  Location: [facility];  Service: Gynecology;  Laterality: N/A;   TONSILLECTOMY     TOTAL HIP ARTHROPLASTY     TUBAL LIGATION       Current Outpatient Medications  Medication Sig Dispense Refill   acetaminophen (TYLENOL) 650 MG CR tablet Take 1,950 mg by mouth in the morning and at bedtime.     chlorthalidone (HYGROTON) 25 MG tablet Take 1 tablet (25 mg total) by mouth daily. 90 tablet 1   dicyclomine (BENTYL) 20 MG tablet Take 1 tablet (20 mg total) by mouth 3 (three) times daily before meals. 90 tablet 0   losartan (COZAAR) 100 MG tablet Take 1 tablet (100 mg total) by mouth daily. 90 tablet 1   metoprolol tartrate (LOPRESSOR) 25 MG tablet Take 1 tablet (25 mg total) by mouth 2 (two) times daily. 180 tablet 1   pantoprazole (PROTONIX) 40 MG tablet Take 1 tablet (40 mg total) by mouth daily. 30 tablet 3   rosuvastatin (CRESTOR) 10 MG tablet TAKE 1 TABLET BY MOUTH EVERY DAY IN THE EVENING. [place]  tablet 0   XARELTO 20 MG TABS tablet TAKE ONE (1) TABLET ONCE DAILY WITH SUPPER 90 tablet 1   No current facility-administered medications for this visit.    Allergies:   Patient has no known allergies.   Social History:  The patient  reports that she has never smoked. She has never used smokeless tobacco. She reports current alcohol use. She reports that she does not use drugs.   Family History:  The patient's family history includes Aneurysm in her mother; Cancer in her paternal uncle; Emphysema in her father.   ROS:   Please see the history of present illness.   Otherwise, review of systems is positive for none.   All other systems are reviewed and negative.   PHYSICAL EXAM: VS:  BP 135/74   Pulse 74   Ht 5\' 2"  (1.575 m)   Wt 261 lb 6.4 oz (118.6 kg)   SpO2 98%   BMI 47.81 kg/m  , BMI Body mass index is 47.81 kg/m. GEN: Well nourished, well developed, in no acute distress  HEENT: normal  Neck: no JVD, carotid bruits, or masses Cardiac: RRR; no murmurs, rubs, or gallops,no edema  Respiratory:  clear to auscultation bilaterally, normal work of breathing GI: soft, nontender, nondistended, + BS MS: no deformity or atrophy  Skin: warm and dry Neuro:  Strength and sensation are intact Psych: euthymic mood, full affect  EKG:  EKG is not ordered today. Personal review of the ekg ordered 08/12/20 shows sinus rhythm, rate 65  Recent Labs: 02/13/2020: TSH 2.440 07/17/2020: ALT 12; BUN 19; Creatinine, Ser 0.80; Hemoglobin 13.7; Platelets 277; Potassium 5.2; Sodium 143    Lipid Panel     Component Value Date/Time   CHOL 170 05/31/2020 0833   TRIG 112 05/31/2020 0833   HDL 59 05/31/2020 0833   CHOLHDL 2.9 05/31/2020 0833   LDLCALC 91 05/31/2020 0833     Wt Readings from Last 3 Encounters:  11/18/20 261 lb 6.4 oz (118.6 kg)  10/10/20 261 lb (118.4 kg)  08/15/20 259 lb 7 oz (117.7 kg)      Other studies Reviewed: Additional studies/ records that were reviewed today include: Monitor 01/17/19  Review of the above records today demonstrates:  Monitor shows PAF/flutter with possible aberrancy vs. NSVT (up to 12 beats).   ASSESSMENT AND PLAN:  1.  Persistent atrial fibrillation: Currently on Xarelto.  CHA2DS2-VASc of 2.  Status post ablation 05/15/2020.  She has had 2 episodes of atrial fibrillation, 1 around the time of a pet dying.  This could all be due to stress.  Despite that, her blood pressure is mildly elevated.  Helen West increase metoprolol to 50 mg twice daily.  2.  Hypertension: Usually  well controlled.  Increasing metoprolol as above.  3.  Obstructive sleep apnea: CPAP compliance encouraged  4.  Morbid obesity: Diet and exercise encouraged.  We Helen West give her phone numbers to weight loss clinic in [place].  Current medicines are reviewed at length with the patient today.   The patient does not have concerns regarding her medicines.  The following changes were made today: Increase metoprolol  Labs/ tests ordered today include:  No orders of the defined types were placed in this encounter.    Disposition:   FU with Hollan Philipp 6 months  Signed, Lovetta Condie Meredith Leeds, MD  11/18/2020 9:12 AM     [facility] [address] [place] [address] [phone] (office) [phone] (fax)

## 2020-11-18 ENCOUNTER — Ambulatory Visit: Payer: PPO | Admitting: Cardiology

## 2020-11-18 ENCOUNTER — Encounter: Payer: Self-pay | Admitting: Cardiology

## 2020-11-18 ENCOUNTER — Other Ambulatory Visit: Payer: Self-pay

## 2020-11-18 VITALS — BP 135/74 | HR 74 | Ht 62.0 in | Wt 261.4 lb

## 2020-11-18 DIAGNOSIS — I4819 Other persistent atrial fibrillation: Secondary | ICD-10-CM | POA: Diagnosis not present

## 2020-11-18 MED ORDER — METOPROLOL SUCCINATE ER 50 MG PO TB24: 50.0000 mg | ORAL_TABLET | Freq: Every day | ORAL | 6 refills | Status: DC

## 2020-11-18 NOTE — Patient Instructions (Signed)
Medication Instructions:  Your physician has recommended you make the following change in your medication: STOP Metoprolol Tartrate (Lopressor) START Metoprolol Succinate (Toprol) 50 mg once daily  *If you need a refill on your cardiac medications before your next appointment, please call your pharmacy*   Lab Work: None ordered   Testing/Procedures: None ordered   Follow-Up: At [facility], you and your health needs are our priority.  As part of our continuing mission to provide you with exceptional heart care, we have created designated Provider Care Teams.  These Care Teams include your primary Cardiologist (physician) and Advanced Practice Providers (APPs -  Physician Assistants and Nurse Practitioners) who all work together to provide you with the care you need, when you need it.   Your next appointment:   6 month(s)  The format for your next appointment:   In Person  Provider:   Allegra Lai, MD    Thank you for choosing [facility]!!   Trinidad Curet, RN [phone]   Other Instructions    Weight Loss Management [phone]

## 2020-11-25 ENCOUNTER — Other Ambulatory Visit: Payer: Self-pay | Admitting: Cardiology

## 2020-11-25 NOTE — Telephone Encounter (Signed)
Xarelto 20 mg refill request received. Pt is 65 years old, weight- 118.6 kg, Crea- 0.8, last seen by Dr. Curt Bears on 11/18/20, Diagnosis-afib,  CrCl-127.76 ; Dose is appropriate based on dosing criteria. Will send in refill to requested pharmacy.

## 2020-11-26 ENCOUNTER — Other Ambulatory Visit: Payer: Self-pay | Admitting: Physician Assistant

## 2020-11-28 DIAGNOSIS — K219 Gastro-esophageal reflux disease without esophagitis: Secondary | ICD-10-CM | POA: Diagnosis not present

## 2020-11-28 DIAGNOSIS — G4733 Obstructive sleep apnea (adult) (pediatric): Secondary | ICD-10-CM | POA: Diagnosis not present

## 2020-11-28 NOTE — Progress Notes (Signed)
Subjective:  Patient ID: Helen West, female    DOB: September 07, 1955  Age: 65 y.o. MRN: [id number]  Chief Complaint  Patient presents with   Hyperlipidemia   Hypertension    HPI Atrial fibrillation on xarelto. S/p ablation 05/15/2020. Has had 2 episodes of atrial fibrillation since ablation.Marland Kitchen Bp was elevated also. Cardiology changed metoprolol tartrate to metoprolol succinate 50 mg once daily.   OSA: Finally got her cpap yesterday.   Hyperlipidemia: Current medications: Crestor 10 mg daily. Eat breakfast and lunch and no supper. No snacking.   Hypertension: Complications: Current medications: Chlorthalidone 25 mg daily, Losartan 100 mg daily, Metoprolol succinate 50 mg daily  Diet: Fair.  Exercise: bike 15 minutes per day.  GERD-Protonix 40 mg twice daily  Current Outpatient Medications on File Prior to Visit  Medication Sig Dispense Refill   acetaminophen (TYLENOL) 650 MG CR tablet Take 1,950 mg by mouth in the morning and at bedtime.     chlorthalidone (HYGROTON) 25 MG tablet Take 1 tablet (25 mg total) by mouth daily. 90 tablet 1   dicyclomine (BENTYL) 20 MG tablet Take 1 tablet (20 mg total) by mouth 3 (three) times daily before meals. 90 tablet 0   losartan (COZAAR) 100 MG tablet Take 1 tablet (100 mg total) by mouth daily. 90 tablet 1   metoprolol succinate (TOPROL-XL) 50 MG 24 hr tablet Take 1 tablet (50 mg total) by mouth daily. Take with or immediately following a meal. 30 tablet 6   pantoprazole (PROTONIX) 40 MG tablet Take 1 tablet (40 mg total) by mouth daily. 30 tablet 3   rosuvastatin (CRESTOR) 10 MG tablet TAKE 1 TABLET BY MOUTH EVERY DAY IN THE EVENING. 90 tablet 0   XARELTO 20 MG TABS tablet TAKE ONE (1) TABLET ONCE DAILY WITH SUPPER 90 tablet 1   No current facility-administered medications on file prior to visit.   Past Medical History:  Diagnosis Date   A-fib ([place])    Anemia    Arthritis    Cancer ([place])    Complication of anesthesia    Loss of  vocal/voice x 2 months   Dysrhythmia    Headache    High cholesterol    History of radiation therapy 01/12/2018   11/14, 11/21, 11/27, 12/4, 01/12/18:  Vaginal cuff, 6 Gy in 5 fractions for a total dose of 30 Gy  Dr Gery Pray   Obesity    Post-menopausal bleeding    Past Surgical History:  Procedure Laterality Date   ATRIAL FIBRILLATION ABLATION N/A 05/15/2020   Procedure: ATRIAL FIBRILLATION ABLATION;  Surgeon: [name], MD;  Location: [facility];  Service: Cardiovascular;  Laterality: N/A;   ROBOTIC ASSISTED TOTAL HYSTERECTOMY WITH BILATERAL SALPINGO OOPHERECTOMY Bilateral 11/02/2017   Procedure: XI ROBOTIC ASSISTED TOTAL HYSTERECTOMY WITH BILATERAL SALPINGO OOPHORECTOMY;  Surgeon: [name], MD;  Location: [facility];  Service: Gynecology;  Laterality: Bilateral;   SENTINEL NODE BIOPSY N/A 11/02/2017   Procedure: SENTINEL NODE BIOPSY;  Surgeon: [name], MD;  Location: [facility];  Service: Gynecology;  Laterality: N/A;   TONSILLECTOMY     TOTAL HIP ARTHROPLASTY     TUBAL LIGATION      Family History  Problem Relation Age of Onset   Aneurysm Mother    Emphysema Father    Cancer Paternal Uncle    Social History   Socioeconomic History   Marital status: Married    Spouse name: Not on file   Number of children: Not on file  Years of education: Not on file   Highest education level: Not on file  Occupational History   Not on file  Tobacco Use   Smoking status: Never   Smokeless tobacco: Never  Vaping Use   Vaping Use: Never used  Substance and Sexual Activity   Alcohol use: Yes    Comment: occasional social   Drug use: Never   Sexual activity: Not on file  Other Topics Concern   Not on file  Social History Narrative   Not on file   Social Determinants of Health   Financial Resource Strain: Not on file  Food Insecurity: Not on file  Transportation Needs: Not on file  Physical Activity: Not on file  Stress: Not on file  Social Connections: Not  on file    Review of Systems  Constitutional:  Negative for chills, fatigue and fever.  HENT:  Negative for congestion, rhinorrhea and sore throat.   Respiratory:  Negative for cough and shortness of breath.   Cardiovascular:  Negative for chest pain.  Gastrointestinal:  Negative for abdominal pain, constipation, diarrhea, nausea and vomiting.  Endocrine: Negative for polydipsia, polyphagia and polyuria.  Genitourinary:  Negative for dysuria and urgency.  Musculoskeletal:  Positive for arthralgias and back pain. Negative for myalgias.  Neurological:  Negative for dizziness, weakness, light-headedness and headaches.  Psychiatric/Behavioral:  Negative for dysphoric mood. The patient is not nervous/anxious.     Objective:  BP 110/70   Pulse 76   Temp 97.6 F (36.4 C)   Resp 16   Ht 5\' 2"  (1.575 m)   Wt 261 lb (118.4 kg)   BMI 47.74 kg/m   BP/Weight 11/29/2020 44/04/4740 06/10/5636  Systolic BP 756 433 -  Diastolic BP 70 74 -  Wt. (Lbs) 261 261.4 261  BMI 47.74 47.81 47.74    Physical Exam Vitals reviewed.  Constitutional:      Appearance: Normal appearance. She is obese.  Neck:     Vascular: No carotid bruit.  Cardiovascular:     Rate and Rhythm: Normal rate and regular rhythm.     Pulses: Normal pulses.     Heart sounds: Normal heart sounds.  Pulmonary:     Effort: Pulmonary effort is normal. No respiratory distress.     Breath sounds: Normal breath sounds.  Abdominal:     General: Abdomen is flat. Bowel sounds are normal.     Palpations: Abdomen is soft.     Tenderness: There is no abdominal tenderness.  Neurological:     Mental Status: She is alert and oriented to person, place, and time.  Psychiatric:        Mood and Affect: Mood normal.        Behavior: Behavior normal.    Diabetic Foot Exam - Simple   No data filed      Lab Results  Component Value Date   WBC 6.4 07/17/2020   HGB 13.7 07/17/2020   HCT 42.6 07/17/2020   PLT 277 07/17/2020   GLUCOSE  117 (H) 07/17/2020   CHOL 170 05/31/2020   TRIG 112 05/31/2020   HDL 59 05/31/2020   LDLCALC 91 05/31/2020   ALT 12 07/17/2020   AST 20 07/17/2020   NA 143 07/17/2020   K 5.2 07/17/2020   CL 98 07/17/2020   CREATININE 0.80 07/17/2020   BUN 19 07/17/2020   CO2 28 07/17/2020   TSH 2.440 02/13/2020      Assessment & Plan:   Problem List Items Addressed This Visit  Cardiovascular and Mediastinum   Atrial flutter (HCC)    Controlled with xarelto Continue Metoprolol Succinate 50 mg daily      PAF (paroxysmal atrial fibrillation) (HCC)    The current medical regimen is effective;  continue present plan and medications. Continue xarelto 20 mg once daily. Continue metoprolol succinate 50 mg once daily.  Management per specialist.        Primary hypertension - Primary    The current medical regimen is effective;  continue present plan and medications. Controlled with Chlorthalidone 25 mg , Losartan 100 mg and Metoprolol 50 mg daily      Relevant Orders   Comprehensive metabolic panel     Digestive   GERD (gastroesophageal reflux disease)    The current medical regimen is effective;  continue present plan and medications. Controlled with Protonix 40 mg BID        Hematopoietic and Hemostatic   Acquired thrombophilia ([place])    On xarelto.        Other   Elevated glucose    Recommend continue to work on eating healthy diet and exercise. Check a1c.       Relevant Orders   Hemoglobin A1c   Mixed hyperlipidemia    The current medical regimen is effective;  continue present plan and medications. Controlled with Crestor 10 mg daily      Relevant Orders   CBC with Differential/Platelet   Lipid panel   Other Visit Diagnoses     Need for immunization against influenza       Relevant Orders   Flu Vaccine QUAD High Dose(Fluad) (Completed)     .  No orders of the defined types were placed in this encounter.   Orders Placed This Encounter  Procedures    Flu Vaccine QUAD High Dose(Fluad)   CBC with Differential/Platelet   Comprehensive metabolic panel   Lipid panel   Hemoglobin A1c     Follow-up: Return in about 3 months (around 03/01/2021) for chronic fasting.  An After Visit Summary was printed and given to the patient.  [name], MD [facility] [phone]

## 2020-11-29 ENCOUNTER — Ambulatory Visit (INDEPENDENT_AMBULATORY_CARE_PROVIDER_SITE_OTHER): Payer: PPO | Admitting: Family Medicine

## 2020-11-29 ENCOUNTER — Other Ambulatory Visit: Payer: Self-pay

## 2020-11-29 ENCOUNTER — Encounter: Payer: Self-pay | Admitting: Family Medicine

## 2020-11-29 VITALS — BP 110/70 | HR 76 | Temp 97.6°F | Resp 16 | Ht 62.0 in | Wt 261.0 lb

## 2020-11-29 DIAGNOSIS — I4892 Unspecified atrial flutter: Secondary | ICD-10-CM

## 2020-11-29 DIAGNOSIS — I480 Paroxysmal atrial fibrillation: Secondary | ICD-10-CM | POA: Diagnosis not present

## 2020-11-29 DIAGNOSIS — E782 Mixed hyperlipidemia: Secondary | ICD-10-CM | POA: Diagnosis not present

## 2020-11-29 DIAGNOSIS — D6869 Other thrombophilia: Secondary | ICD-10-CM | POA: Diagnosis not present

## 2020-11-29 DIAGNOSIS — R7309 Other abnormal glucose: Secondary | ICD-10-CM | POA: Insufficient documentation

## 2020-11-29 DIAGNOSIS — Z23 Encounter for immunization: Secondary | ICD-10-CM

## 2020-11-29 DIAGNOSIS — K219 Gastro-esophageal reflux disease without esophagitis: Secondary | ICD-10-CM | POA: Diagnosis not present

## 2020-11-29 DIAGNOSIS — I10 Essential (primary) hypertension: Secondary | ICD-10-CM | POA: Diagnosis not present

## 2020-11-29 NOTE — Assessment & Plan Note (Signed)
The current medical regimen is effective;  continue present plan and medications. Controlled with Protonix 40 mg BID

## 2020-11-29 NOTE — Assessment & Plan Note (Addendum)
The current medical regimen is effective;  continue present plan and medications. Continue xarelto 20 mg once daily. Continue metoprolol succinate 50 mg once daily.  Management per specialist.

## 2020-11-29 NOTE — Assessment & Plan Note (Signed)
The current medical regimen is effective;  continue present plan and medications. Controlled with Chlorthalidone 25 mg , Losartan 100 mg and Metoprolol 50 mg daily

## 2020-11-29 NOTE — Patient Instructions (Signed)
Calcium citrate with D 1200-1500 mg daily.  Recommend continue to work on eating healthy diet and exercise.

## 2020-11-29 NOTE — Assessment & Plan Note (Addendum)
Controlled with xarelto Continue Metoprolol Succinate 50 mg daily

## 2020-11-29 NOTE — Assessment & Plan Note (Signed)
Recommend continue to work on eating healthy diet and exercise. Check a1c 

## 2020-11-29 NOTE — Assessment & Plan Note (Signed)
On xarelto 

## 2020-11-29 NOTE — Assessment & Plan Note (Signed)
The current medical regimen is effective;  continue present plan and medications. Controlled with Crestor 10 mg daily

## 2020-11-30 LAB — COMPREHENSIVE METABOLIC PANEL
ALT: 7 IU/L (ref 0–32)
AST: 15 IU/L (ref 0–40)
Albumin/Globulin Ratio: 1.8 (ref 1.2–2.2)
Albumin: 4.4 g/dL (ref 3.8–4.8)
Alkaline Phosphatase: 86 IU/L (ref 44–121)
BUN/Creatinine Ratio: 29 — ABNORMAL HIGH (ref 12–28)
BUN: 24 mg/dL (ref 8–27)
Bilirubin Total: 0.4 mg/dL (ref 0.0–1.2)
CO2: 31 mmol/L — ABNORMAL HIGH (ref 20–29)
Calcium: 9.7 mg/dL (ref 8.7–10.3)
Chloride: 94 mmol/L — ABNORMAL LOW (ref 96–106)
Creatinine, Ser: 0.83 mg/dL (ref 0.57–1.00)
Globulin, Total: 2.5 g/dL (ref 1.5–4.5)
Glucose: 110 mg/dL — ABNORMAL HIGH (ref 70–99)
Potassium: 4.5 mmol/L (ref 3.5–5.2)
Sodium: 137 mmol/L (ref 134–144)
Total Protein: 6.9 g/dL (ref 6.0–8.5)
eGFR: 77 mL/min/{1.73_m2} (ref >59)

## 2020-11-30 LAB — CBC WITH DIFFERENTIAL/PLATELET
Basophils Absolute: 0 10*3/uL (ref 0.0–0.2)
Basos: 1 %
EOS (ABSOLUTE): 0.2 10*3/uL (ref 0.0–0.4)
Eos: 4 %
Hematocrit: 41.4 % (ref 34.0–46.6)
Hemoglobin: 13.4 g/dL (ref 11.1–15.9)
Immature Grans (Abs): 0 10*3/uL (ref 0.0–0.1)
Immature Granulocytes: 0 %
Lymphocytes Absolute: 1.6 10*3/uL (ref 0.7–3.1)
Lymphs: 30 %
MCH: 30.7 pg (ref 26.6–33.0)
MCHC: 32.4 g/dL (ref 31.5–35.7)
MCV: 95 fL (ref 79–97)
Monocytes Absolute: 0.5 10*3/uL (ref 0.1–0.9)
Monocytes: 9 %
Neutrophils Absolute: 3.1 10*3/uL (ref 1.4–7.0)
Neutrophils: 56 %
Platelets: 266 10*3/uL (ref 150–450)
RBC: 4.36 x10E6/uL (ref 3.77–5.28)
RDW: 12.7 % (ref 11.7–15.4)
WBC: 5.4 10*3/uL (ref 3.4–10.8)

## 2020-11-30 LAB — LIPID PANEL
Chol/HDL Ratio: 3.7 ratio (ref 0.0–4.4)
Cholesterol, Total: 188 mg/dL (ref 100–199)
HDL: 51 mg/dL (ref >39)
LDL Chol Calc (NIH): 111 mg/dL — ABNORMAL HIGH (ref 0–99)
Triglycerides: 145 mg/dL (ref 0–149)
VLDL Cholesterol Cal: 26 mg/dL (ref 5–40)

## 2020-11-30 LAB — HEMOGLOBIN A1C
Est. average glucose Bld gHb Est-mCnc: 131 mg/dL
Hgb A1c MFr Bld: 6.2 % — ABNORMAL HIGH (ref 4.8–5.6)

## 2020-11-30 LAB — CARDIOVASCULAR RISK ASSESSMENT

## 2020-12-13 DIAGNOSIS — G4733 Obstructive sleep apnea (adult) (pediatric): Secondary | ICD-10-CM | POA: Diagnosis not present

## 2020-12-16 ENCOUNTER — Telehealth: Payer: Self-pay | Admitting: Cardiology

## 2020-12-16 NOTE — Telephone Encounter (Signed)
Patient states she is she was told she needs to follow up with MD within a certain time frame after sleep study. She assumes this needs to be with Dr. Curt Bears because he ordered it, but follow-up likely needs to be with Dr. Radford Pax. Can someone return patient's call to confirm who patient needs to follow up with? Thank you.

## 2020-12-16 NOTE — Telephone Encounter (Signed)
Spoke with the patient and advised her that she needed to be seen in 90 days. She states she was set up 3 weeks ago, around 10/24. I have scheduled her for a sleep compliance appointment with Dr. Radford Pax

## 2020-12-17 DIAGNOSIS — G4733 Obstructive sleep apnea (adult) (pediatric): Secondary | ICD-10-CM | POA: Diagnosis not present

## 2020-12-25 ENCOUNTER — Encounter: Payer: Self-pay | Admitting: Nurse Practitioner

## 2020-12-25 ENCOUNTER — Ambulatory Visit (INDEPENDENT_AMBULATORY_CARE_PROVIDER_SITE_OTHER): Payer: PPO | Admitting: Nurse Practitioner

## 2020-12-25 VITALS — BP 132/78 | HR 70 | Temp 97.3°F | Resp 18 | Ht 62.0 in | Wt 262.0 lb

## 2020-12-25 DIAGNOSIS — R051 Acute cough: Secondary | ICD-10-CM | POA: Diagnosis not present

## 2020-12-25 DIAGNOSIS — J018 Other acute sinusitis: Secondary | ICD-10-CM | POA: Diagnosis not present

## 2020-12-25 DIAGNOSIS — R42 Dizziness and giddiness: Secondary | ICD-10-CM

## 2020-12-25 DIAGNOSIS — J309 Allergic rhinitis, unspecified: Secondary | ICD-10-CM | POA: Diagnosis not present

## 2020-12-25 LAB — POCT INFLUENZA A/B
Influenza A, POC: NEGATIVE
Influenza B, POC: NEGATIVE

## 2020-12-25 LAB — POC COVID19 BINAXNOW: SARS Coronavirus 2 Ag: NEGATIVE

## 2020-12-25 MED ORDER — FLUTICASONE PROPIONATE 50 MCG/ACT NA SUSP: 2.0000 | Freq: Every day | NASAL | 6 refills | Status: DC

## 2020-12-25 MED ORDER — AZITHROMYCIN 250 MG PO TABS: ORAL_TABLET | ORAL | 0 refills | Status: AC

## 2020-12-25 MED ORDER — MECLIZINE HCL 25 MG PO TABS: 25.0000 mg | ORAL_TABLET | Freq: Three times a day (TID) | ORAL | 0 refills | Status: DC | PRN

## 2020-12-25 NOTE — Patient Instructions (Signed)
Take Zpack as prescribed Use Flonase nasal spray daily Take Meclizine as prescribed for dizziness Rest and push fluids Follow-up as needed    Sinusitis, Adult Sinusitis is soreness and swelling (inflammation) of your sinuses. Sinuses are hollow spaces in the bones around your face. They are located: Around your eyes. In the middle of your forehead. Behind your nose. In your cheekbones. Your sinuses and nasal passages are lined with a fluid called mucus. Mucus drains out of your sinuses. Swelling can trap mucus in your sinuses. This lets germs (bacteria, virus, or fungus) grow, which leads to infection. Most of the time, this condition is caused by a virus. What are the causes? This condition is caused by: Allergies. Asthma. Germs. Things that block your nose or sinuses. Growths in the nose (nasal polyps). Chemicals or irritants in the air. Fungus (rare). What increases the risk? You are more likely to develop this condition if: You have a weak body defense system (immune system). You do a lot of swimming or diving. You use nasal sprays too much. You smoke. What are the signs or symptoms? The main symptoms of this condition are pain and a feeling of pressure around the sinuses. Other symptoms include: Stuffy nose (congestion). Runny nose (drainage). Swelling and warmth in the sinuses. Headache. Toothache. A cough that may get worse at night. Mucus that collects in the throat or the back of the nose (postnasal drip). Being unable to smell and taste. Being very tired (fatigue). A fever. Sore throat. Bad breath. How is this diagnosed? This condition is diagnosed based on: Your symptoms. Your medical history. A physical exam. Tests to find out if your condition is short-term (acute) or long-term (chronic). Your doctor may: Check your nose for growths (polyps). Check your sinuses using a tool that has a light (endoscope). Check for allergies or germs. Do imaging tests,  such as an MRI or CT scan. How is this treated? Treatment for this condition depends on the cause and whether it is short-term or long-term. If caused by a virus, your symptoms should go away on their own within 10 days. You may be given medicines to relieve symptoms. They include: Medicines that shrink swollen tissue in the nose. Medicines that treat allergies (antihistamines). A spray that treats swelling of the nostrils.  Rinses that help get rid of thick mucus in your nose (nasal saline washes). If caused by bacteria, your doctor may wait to see if you will get better without treatment. You may be given antibiotic medicine if you have: A very bad infection. A weak body defense system. If caused by growths in the nose, you may need to have surgery. Follow these instructions at home: Medicines Take, use, or apply over-the-counter and prescription medicines only as told by your doctor. These may include nasal sprays. If you were prescribed an antibiotic medicine, take it as told by your doctor. Do not stop taking the antibiotic even if you start to feel better. Hydrate and humidify  Drink enough water to keep your pee (urine) pale yellow. Use a cool mist humidifier to keep the humidity level in your home above 50%. Breathe in steam for 10-15 minutes, 3-4 times a day, or as told by your doctor. You can do this in the bathroom while a hot shower is running. Try not to spend time in cool or dry air. Rest Rest as much as you can. Sleep with your head raised (elevated). Make sure you get enough sleep each night. General instructions  Put a warm, moist washcloth on your face 3-4 times a day, or as often as told by your doctor. This will help with discomfort. Wash your hands often with soap and water. If there is no soap and water, use hand sanitizer. Do not smoke. Avoid being around people who are smoking (secondhand smoke). Keep all follow-up visits as told by your doctor. This is  important. Contact a doctor if: You have a fever. Your symptoms get worse. Your symptoms do not get better within 10 days. Get help right away if: You have a very bad headache. You cannot stop throwing up (vomiting). You have very bad pain or swelling around your face or eyes. You have trouble seeing. You feel confused. Your neck is stiff. You have trouble breathing. Summary Sinusitis is swelling of your sinuses. Sinuses are hollow spaces in the bones around your face. This condition is caused by tissues in your nose that become inflamed or swollen. This traps germs. These can lead to infection. If you were prescribed an antibiotic medicine, take it as told by your doctor. Do not stop taking it even if you start to feel better. Keep all follow-up visits as told by your doctor. This is important. This information is not intended to replace advice given to you by your health care provider. Make sure you discuss any questions you have with your health care provider. Document Revised: 06/21/2017 Document Reviewed: 06/21/2017 Elsevier Patient Education  2022 [employer].

## 2020-12-25 NOTE — Progress Notes (Signed)
Acute Office Visit  Subjective:    Patient ID: Helen West, female    DOB: Oct 24, 1955, 65 y.o.   MRN: [id number]  Chief Complaint  Patient presents with   Nasal Congestion   Cough    Cough Associated symptoms include postnasal drip and rhinorrhea. Pertinent negatives include no chills or sore throat. Her past medical history is significant for environmental allergies.  Patient is in today for sinus congestion, rhinorrhea, sneezing, and cough. Onset was one week ago. States mucus has changed from clear to yellow. Treatment has included Claritin, saline nasal spray, and Tylenol. Denies fever, chills, or rash. States she has chronic allergic rhinitis.  Past Medical History:  Diagnosis Date   A-fib ([place])    Anemia    Arthritis    Cancer ([place])    Complication of anesthesia    Loss of vocal/voice x 2 months   Dysrhythmia    Headache    High cholesterol    History of radiation therapy 01/12/2018   11/14, 11/21, 11/27, 12/4, 01/12/18:  Vaginal cuff, 6 Gy in 5 fractions for a total dose of 30 Gy  Dr Gery Pray   Obesity    Post-menopausal bleeding     Past Surgical History:  Procedure Laterality Date   ATRIAL FIBRILLATION ABLATION N/A 05/15/2020   Procedure: ATRIAL FIBRILLATION ABLATION;  Surgeon: [name], MD;  Location: [facility];  Service: Cardiovascular;  Laterality: N/A;   ROBOTIC ASSISTED TOTAL HYSTERECTOMY WITH BILATERAL SALPINGO OOPHERECTOMY Bilateral 11/02/2017   Procedure: XI ROBOTIC ASSISTED TOTAL HYSTERECTOMY WITH BILATERAL SALPINGO OOPHORECTOMY;  Surgeon: [name], MD;  Location: [facility];  Service: Gynecology;  Laterality: Bilateral;   SENTINEL NODE BIOPSY N/A 11/02/2017   Procedure: SENTINEL NODE BIOPSY;  Surgeon: [name], MD;  Location: [facility];  Service: Gynecology;  Laterality: N/A;   TONSILLECTOMY     TOTAL HIP ARTHROPLASTY     TUBAL LIGATION      Family History  Problem Relation Age of Onset   Aneurysm Mother    Emphysema Father     Cancer Paternal Uncle     Social History   Socioeconomic History   Marital status: Married    Spouse name: Not on file   Number of children: Not on file   Years of education: Not on file   Highest education level: Not on file  Occupational History   Not on file  Tobacco Use   Smoking status: Never   Smokeless tobacco: Never  Vaping Use   Vaping Use: Never used  Substance and Sexual Activity   Alcohol use: Yes    Comment: occasional social   Drug use: Never   Sexual activity: Not on file  Other Topics Concern   Not on file  Social History Narrative   Not on file   Social Determinants of Health   Financial Resource Strain: Not on file  Food Insecurity: Not on file  Transportation Needs: Not on file  Physical Activity: Not on file  Stress: Not on file  Social Connections: Not on file  Intimate Partner Violence: Not on file    Outpatient Medications Prior to Visit  Medication Sig Dispense Refill   acetaminophen (TYLENOL) 650 MG CR tablet Take 1,950 mg by mouth in the morning and at bedtime.     chlorthalidone (HYGROTON) 25 MG tablet Take 1 tablet (25 mg total) by mouth daily. 90 tablet 1   dicyclomine (BENTYL) 20 MG tablet Take 1 tablet (20 mg total) by  mouth 3 (three) times daily before meals. 90 tablet 0   losartan (COZAAR) 100 MG tablet Take 1 tablet (100 mg total) by mouth daily. 90 tablet 1   metoprolol succinate (TOPROL-XL) 50 MG 24 hr tablet Take 1 tablet (50 mg total) by mouth daily. Take with or immediately following a meal. 30 tablet 6   pantoprazole (PROTONIX) 40 MG tablet Take 1 tablet (40 mg total) by mouth daily. 30 tablet 3   rosuvastatin (CRESTOR) 10 MG tablet TAKE 1 TABLET BY MOUTH EVERY DAY IN THE EVENING. 90 tablet 0   XARELTO 20 MG TABS tablet TAKE ONE (1) TABLET ONCE DAILY WITH SUPPER 90 tablet 1   No facility-administered medications prior to visit.    No Known Allergies  Review of Systems  Constitutional:  Negative for chills, diaphoresis  and fatigue.  HENT:  Positive for postnasal drip, rhinorrhea, sinus pressure and sinus pain. Negative for sore throat.   Eyes: Negative.   Respiratory:  Positive for cough.   Cardiovascular: Negative.   Gastrointestinal: Negative.   Endocrine: Negative.   Genitourinary: Negative.   Musculoskeletal: Negative.   Skin: Negative.   Allergic/Immunologic: Positive for environmental allergies.  Neurological:  Positive for dizziness.  Hematological: Negative.   Psychiatric/Behavioral: Negative.        Objective:    Physical Exam Vitals reviewed.  Constitutional:      Appearance: Normal appearance.  HENT:     Right Ear: No tenderness. Tympanic membrane is erythematous.     Left Ear: Tympanic membrane normal. No tenderness.     Nose: Congestion and rhinorrhea present.     Mouth/Throat:     Mouth: Mucous membranes are moist.     Pharynx: Posterior oropharyngeal erythema present.  Eyes:     Pupils: Pupils are equal, round, and reactive to light.  Cardiovascular:     Rate and Rhythm: Normal rate and regular rhythm.     Pulses: Normal pulses.     Heart sounds: Normal heart sounds.  Pulmonary:     Effort: Pulmonary effort is normal.     Breath sounds: Normal breath sounds.  Musculoskeletal:        General: Normal range of motion.     Cervical back: Neck supple.  Skin:    General: Skin is warm and dry.     Capillary Refill: Capillary refill takes less than 2 seconds.  Neurological:     General: No focal deficit present.     Mental Status: She is alert and oriented to person, place, and time.  Psychiatric:        Mood and Affect: Mood normal.        Behavior: Behavior normal.    BP 132/78   Pulse 70   Temp (!) 97.3 F (36.3 C)   Resp 18   Ht $R'5\' 2"'Cu$  (1.575 m)   Wt 262 lb (118.8 kg)   SpO2 95%   BMI 47.92 kg/m  Wt Readings from Last 3 Encounters:  12/25/20 262 lb (118.8 kg)  11/29/20 261 lb (118.4 kg)  11/18/20 261 lb 6.4 oz (118.6 kg)    Health Maintenance Due  Topic  Date Due   Hepatitis C Screening  Never done   TETANUS/TDAP  Never done   Zoster Vaccines- Shingrix (1 of 2) Never done   COVID-19 Vaccine (4 - Booster for Moderna series) 12/05/2019   Pneumonia Vaccine 24+ Years old (2 - PCV) 02/12/2021     Lab Results  Component Value Date   TSH  2.440 02/13/2020   Lab Results  Component Value Date   WBC 5.4 11/29/2020   HGB 13.4 11/29/2020   HCT 41.4 11/29/2020   MCV 95 11/29/2020   PLT 266 11/29/2020   Lab Results  Component Value Date   NA 137 11/29/2020   K 4.5 11/29/2020   CO2 31 (H) 11/29/2020   GLUCOSE 110 (H) 11/29/2020   BUN 24 11/29/2020   CREATININE 0.83 11/29/2020   BILITOT 0.4 11/29/2020   ALKPHOS 86 11/29/2020   AST 15 11/29/2020   ALT 7 11/29/2020   PROT 6.9 11/29/2020   ALBUMIN 4.4 11/29/2020   CALCIUM 9.7 11/29/2020   ANIONGAP 8 11/03/2017   EGFR 77 11/29/2020   Lab Results  Component Value Date   CHOL 188 11/29/2020   Lab Results  Component Value Date   HDL 51 11/29/2020   Lab Results  Component Value Date   LDLCALC 111 (H) 11/29/2020   Lab Results  Component Value Date   TRIG 145 11/29/2020   Lab Results  Component Value Date   CHOLHDL 3.7 11/29/2020   Lab Results  Component Value Date   HGBA1C 6.2 (H) 11/29/2020       Assessment & Plan:   1. Acute non-recurrent sinusitis of other sinus - azithromycin (ZITHROMAX) 250 MG tablet; Take 2 tablets on day 1, then 1 tablet daily on days 2 through 5  Dispense: 6 tablet; Refill: 0  2. Chronic allergic rhinitis - fluticasone (FLONASE) 50 MCG/ACT nasal spray; Place 2 sprays into both nostrils daily.  Dispense: 16 g; Refill: 6  3. Acute cough - POC COVID-19-NEGATIVE - Influenza A/B-NEGATIVE  4. Vertigo - meclizine (ANTIVERT) 25 MG tablet; Take 1 tablet (25 mg total) by mouth 3 (three) times daily as needed for dizziness.  Dispense: 30 tablet; Refill: 0    Take Zpack as prescribed Use Flonase nasal spray daily Take Meclizine as prescribed for  dizziness Rest and push fluids Follow-up as needed  Follow-up: As needed   I, Rip Harbour, NP, have reviewed all documentation for this visit. The documentation on 12/25/20 for the exam, diagnosis, procedures, and orders are all accurate and complete.   Signed, Rip Harbour, NP

## 2020-12-29 DIAGNOSIS — G4733 Obstructive sleep apnea (adult) (pediatric): Secondary | ICD-10-CM | POA: Diagnosis not present

## 2020-12-30 ENCOUNTER — Telehealth: Payer: Self-pay | Admitting: *Deleted

## 2020-12-30 NOTE — Telephone Encounter (Signed)
Enid Derry from radiation called and scheduled the patient for a follow up appt with Dr Berline Lopes on 1/9 at 2 pm. Enid Derry to contact the patient

## 2020-12-31 ENCOUNTER — Telehealth: Payer: Self-pay | Admitting: *Deleted

## 2020-12-31 NOTE — Telephone Encounter (Signed)
CALLED PATIENT TO INFORM OF FU APPT. WITH DR. Berline Lopes ON 02-10-21 - ARRIVAL TIME- 1:30 PM, SPOKE WITH PATIENT AND SHE IS AWARE OF THIS APPT.

## 2021-01-04 ENCOUNTER — Other Ambulatory Visit: Payer: Self-pay | Admitting: Family Medicine

## 2021-01-04 ENCOUNTER — Other Ambulatory Visit: Payer: Self-pay | Admitting: Nurse Practitioner

## 2021-01-04 DIAGNOSIS — J018 Other acute sinusitis: Secondary | ICD-10-CM

## 2021-01-04 DIAGNOSIS — I10 Essential (primary) hypertension: Secondary | ICD-10-CM

## 2021-01-06 ENCOUNTER — Telehealth: Payer: Self-pay

## 2021-01-06 MED ORDER — AZITHROMYCIN 250 MG PO TABS: 250.0000 mg | ORAL_TABLET | Freq: Every day | ORAL | 0 refills | Status: DC

## 2021-01-06 NOTE — Telephone Encounter (Signed)
Helen West called the office to request a refill on her Zpak.  Her symptoms have improved but never cleared.  She reports the last sinus infection that she had required 2 rounds of a zpak before her symptoms cleared.  Dr. Tobie Poet approved the refill.

## 2021-01-28 DIAGNOSIS — G4733 Obstructive sleep apnea (adult) (pediatric): Secondary | ICD-10-CM | POA: Diagnosis not present

## 2021-02-07 ENCOUNTER — Encounter: Payer: Self-pay | Admitting: Oncology

## 2021-02-07 DIAGNOSIS — C541 Malignant neoplasm of endometrium: Secondary | ICD-10-CM

## 2021-02-07 NOTE — Progress Notes (Signed)
Referral placed for the survivorship program..

## 2021-02-10 ENCOUNTER — Encounter: Payer: Self-pay | Admitting: Family Medicine

## 2021-02-10 ENCOUNTER — Ambulatory Visit: Payer: PPO | Admitting: Gynecologic Oncology

## 2021-02-10 ENCOUNTER — Telehealth (INDEPENDENT_AMBULATORY_CARE_PROVIDER_SITE_OTHER): Payer: PPO | Admitting: Family Medicine

## 2021-02-10 ENCOUNTER — Telehealth: Payer: Self-pay

## 2021-02-10 VITALS — BP 124/72 | HR 72 | Ht 62.0 in | Wt 245.0 lb

## 2021-02-10 DIAGNOSIS — U071 COVID-19: Secondary | ICD-10-CM

## 2021-02-10 DIAGNOSIS — J069 Acute upper respiratory infection, unspecified: Secondary | ICD-10-CM | POA: Diagnosis not present

## 2021-02-10 DIAGNOSIS — J00 Acute nasopharyngitis [common cold]: Secondary | ICD-10-CM

## 2021-02-10 LAB — POCT INFLUENZA A/B
Influenza A, POC: NEGATIVE
Influenza B, POC: NEGATIVE

## 2021-02-10 LAB — POC COVID19 BINAXNOW: SARS Coronavirus 2 Ag: POSITIVE — AB

## 2021-02-10 MED ORDER — MOLNUPIRAVIR EUA 200MG CAPSULE: 4.0000 | ORAL_CAPSULE | Freq: Two times a day (BID) | ORAL | 0 refills | Status: AC

## 2021-02-10 MED ORDER — BENZONATATE 200 MG PO CAPS: 200.0000 mg | ORAL_CAPSULE | Freq: Three times a day (TID) | ORAL | 0 refills | Status: DC | PRN

## 2021-02-10 NOTE — Telephone Encounter (Signed)
Received call from patient. Patient states she is sick and needs to cancel her appointment today. Patient will call the office when she is ready to reschedule.

## 2021-02-10 NOTE — Patient Instructions (Addendum)
Your COVID test is positive. You should remain isolated and quarantine for at least 5 days from start of symptoms. You must be feeling better and be fever free without any fever reducers for at least 24 hours as well. You should wear a mask at all times when out of your home or around others for 5 days after leaving isolation.  Your household contacts should be tested as well as work contacts. If you feel worse or have increasing shortness of breath, you should be seen in person at urgent care or the emergency room.   

## 2021-02-10 NOTE — Progress Notes (Signed)
Virtual Visit via Video Note   This visit type was conducted due to national recommendations for restrictions regarding the COVID-19 Pandemic (e.g. social distancing) in an effort to limit this patient's exposure and mitigate transmission in our community.  Due to her co-morbid illnesses, this patient is at least at moderate risk for complications without adequate follow up.  This format is felt to be most appropriate for this patient at this time.  All issues noted in this document were discussed and addressed.  A limited physical exam was performed with this format.  A verbal consent was obtained for the virtual visit.   Date:  02/24/2021   ID:  Helen West, DOB 03/04/1955, MRN [id number]  Patient Location: Home Provider Location: Office/Clinic  PCP:  [name], MD   Evaluation Performed:  Sick  Chief Complaint:  Cough, Nasal Congestion  History of Present Illness:    Helen West is a 66 y.o. female with complains of cough,nasal congestion, fever, body aches, chills, headache, and sore throat for 24 hours.   The patient does have symptoms concerning for COVID-19 infection (fever, chills, cough, or new shortness of breath).    Past Medical History:  Diagnosis Date   A-fib ([place])    Anemia    Arthritis    Cancer ([place])    Complication of anesthesia    Loss of vocal/voice x 2 months   Dysrhythmia    Headache    High cholesterol    History of radiation therapy 01/12/2018   11/14, 11/21, 11/27, 12/4, 01/12/18:  Vaginal cuff, 6 Gy in 5 fractions for a total dose of 30 Gy  Dr Gery Pray   Obesity    Post-menopausal bleeding     Past Surgical History:  Procedure Laterality Date   ATRIAL FIBRILLATION ABLATION N/A 05/15/2020   Procedure: ATRIAL FIBRILLATION ABLATION;  Surgeon: [name], MD;  Location: [facility];  Service: Cardiovascular;  Laterality: N/A;   ROBOTIC ASSISTED TOTAL HYSTERECTOMY WITH BILATERAL SALPINGO OOPHERECTOMY Bilateral 11/02/2017    Procedure: XI ROBOTIC ASSISTED TOTAL HYSTERECTOMY WITH BILATERAL SALPINGO OOPHORECTOMY;  Surgeon: [name], MD;  Location: [facility];  Service: Gynecology;  Laterality: Bilateral;   SENTINEL NODE BIOPSY N/A 11/02/2017   Procedure: SENTINEL NODE BIOPSY;  Surgeon: [name], MD;  Location: [facility];  Service: Gynecology;  Laterality: N/A;   TONSILLECTOMY     TOTAL HIP ARTHROPLASTY     TUBAL LIGATION      Family History  Problem Relation Age of Onset   Aneurysm Mother    Emphysema Father    Cancer Paternal Uncle     Social History   Socioeconomic History   Marital status: Married    Spouse name: Not on file   Number of children: Not on file   Years of education: Not on file   Highest education level: Not on file  Occupational History   Not on file  Tobacco Use   Smoking status: Never   Smokeless tobacco: Never  Vaping Use   Vaping Use: Never used  Substance and Sexual Activity   Alcohol use: Yes    Comment: occasional social   Drug use: Never   Sexual activity: Not on file  Other Topics Concern   Not on file  Social History Narrative   Not on file   Social Determinants of Health   Financial Resource Strain: Not on file  Food Insecurity: Not on file  Transportation Needs: Not on file  Physical Activity:  Not on file  Stress: Not on file  Social Connections: Not on file  Intimate Partner Violence: Not on file    Outpatient Medications Prior to Visit  Medication Sig Dispense Refill   acetaminophen (TYLENOL) 650 MG CR tablet Take 1,950 mg by mouth in the morning and at bedtime.     chlorthalidone (HYGROTON) 25 MG tablet TAKE ONE (1) TABLET BY MOUTH ONCE DAILY 90 tablet 1   fluticasone (FLONASE) 50 MCG/ACT nasal spray Place 2 sprays into both nostrils daily. 16 g 6   losartan (COZAAR) 100 MG tablet TAKE ONE (1) TABLET BY MOUTH ONCE DAILY 90 tablet 1   meclizine (ANTIVERT) 25 MG tablet Take 1 tablet (25 mg total) by mouth 3 (three) times daily as needed for dizziness.  30 tablet 0   metoprolol succinate (TOPROL-XL) 50 MG 24 hr tablet Take 1 tablet (50 mg total) by mouth daily. Take with or immediately following a meal. 30 tablet 6   pantoprazole (PROTONIX) 40 MG tablet Take 1 tablet (40 mg total) by mouth daily. 30 tablet 3   rosuvastatin (CRESTOR) 10 MG tablet TAKE 1 TABLET BY MOUTH EVERY DAY IN THE EVENING. 90 tablet 0   dicyclomine (BENTYL) 20 MG tablet Take 1 tablet (20 mg total) by mouth 3 (three) times daily before meals. (Patient not taking: Reported on 02/20/2021) 90 tablet 0   XARELTO 20 MG TABS tablet TAKE ONE (1) TABLET ONCE DAILY WITH SUPPER 90 tablet 1   azithromycin (ZITHROMAX) 250 MG tablet Take 1 tablet (250 mg total) by mouth daily. 6 each 0   No facility-administered medications prior to visit.    Allergies:   Patient has no known allergies.   Social History   Tobacco Use   Smoking status: Never   Smokeless tobacco: Never  Vaping Use   Vaping Use: Never used  Substance Use Topics   Alcohol use: Yes    Comment: occasional social   Drug use: Never     Review of Systems  Constitutional:  Positive for fever and malaise/fatigue.  HENT:  Positive for congestion and sore throat. Negative for ear pain.   Respiratory:  Positive for cough. Negative for shortness of breath.   Cardiovascular:  Negative for chest pain and palpitations.  Gastrointestinal:  Negative for abdominal pain, diarrhea, nausea and vomiting.  Neurological:  Positive for headaches.   Labs/Other Tests and Data Reviewed:    Recent Labs: 11/29/2020: ALT 7; BUN 24; Creatinine, Ser 0.83; Hemoglobin 13.4; Platelets 266; Potassium 4.5; Sodium 137   Recent Lipid Panel Lab Results  Component Value Date/Time   CHOL 188 11/29/2020 08:34 AM   TRIG 145 11/29/2020 08:34 AM   HDL 51 11/29/2020 08:34 AM   CHOLHDL 3.7 11/29/2020 08:34 AM   LDLCALC 111 (H) 11/29/2020 08:34 AM    Wt Readings from Last 3 Encounters:  02/20/21 261 lb (118.4 kg)  02/10/21 245 lb (111.1 kg)   12/25/20 262 lb (118.8 kg)     Objective:    Vital Signs:  BP 124/72    Pulse 72    Ht 5\' 2"  (1.575 m)    Wt 245 lb (111.1 kg)    BMI 44.81 kg/m    Physical Exam  Pt sounds congested.  Nose is swollen.   ASSESSMENT & PLAN:    Problem List Items Addressed This Visit       Respiratory   Upper respiratory tract infection due to COVID-19 virus - Primary    Your COVID test is  positive. You should remain isolated and quarantine for at least 5 days from start of symptoms. You must be feeling better and be fever free without any fever reducers for at least 24 hours as well. You should wear a mask at all times when out of your home or around others for 5 days after leaving isolation.  Your household contacts should be tested as well as work contacts. If you feel worse or have increasing shortness of breath, you should be seen in person at urgent care or the emergency room.   Rx: molnupiravir. Recommend rest, fluids, 3 meals per day.  Fever reducing medicines.  OTC cold and congestion medicines. RX: TESSALON PERLES SENT.       Relevant Orders   POC COVID-19 BinaxNow (Completed)   POCT Influenza A/B (Completed)    Orders Placed This Encounter  Procedures   POC COVID-19 BinaxNow   POCT Influenza A/B     Meds ordered this encounter  Medications   benzonatate (TESSALON) 200 MG capsule    Sig: Take 1 capsule (200 mg total) by mouth 3 (three) times daily as needed for cough.    Dispense:  30 capsule    Refill:  0   molnupiravir EUA (LAGEVRIO) 200 mg CAPS capsule    Sig: Take 4 capsules (800 mg total) by mouth 2 (two) times daily for 5 days.    Dispense:  40 capsule    Refill:  0    COVID-19 Education: The signs and symptoms of COVID-19 were discussed with the patient and how to seek care for testing (follow up with PCP or arrange E-visit). The importance of social distancing was discussed today.   I spent 10 minutes dedicated to the care of this patient on the date of this  encounter to include face-to-face time with the patient Follow Up:  Virtual Visit  prn  Signed, [name], MD  02/24/2021 12:20 AM    [place]

## 2021-02-13 ENCOUNTER — Ambulatory Visit: Payer: PPO | Admitting: Cardiology

## 2021-02-18 ENCOUNTER — Other Ambulatory Visit: Payer: Self-pay | Admitting: *Deleted

## 2021-02-18 DIAGNOSIS — I480 Paroxysmal atrial fibrillation: Secondary | ICD-10-CM

## 2021-02-18 MED ORDER — RIVAROXABAN 20 MG PO TABS: ORAL_TABLET | ORAL | 1 refills | Status: DC

## 2021-02-18 NOTE — Telephone Encounter (Signed)
Xarelto 20mg  paper refill request received. Per request pt is now using [facility] in [place]. Pt is 66 years old, weight-111.1kg, Crea-0.83 on 11/29/2020, last seen by Dr. Curt Bears on 11/18/2020, Diagnosis-Afib, CrCl-115.91ml/min; Dose is appropriate based on dosing criteria. Will send in refill to requested pharmacy.

## 2021-02-19 ENCOUNTER — Telehealth: Payer: Self-pay

## 2021-02-19 NOTE — Progress Notes (Signed)
Chronic Care Management Pharmacy Assistant   Name: Helen West  MRN: [id number] DOB: 1955/11/13   Reason for Encounter: General Adherence Call   Recent office visits:  02/10/21 [name] MD. Seen for Cold Symptoms. Started on Benzonatate 200 mg 3 times daily prn and Molnupiravir 800 mg 2 times daily.  12/25/20 Jerrell Belfast NP. Seen for Cold Symptoms. Started Azithromycin 250 mg for 5 days, Flonase Nasal Spray 81mcg/act daily and Meclizine HCI 25 mg 3 times daily prn.   11/29/20 [name], Kirsten MD. Seen for HTN and HLD. No med changes.   Recent consult visits:  11/28/20 No notes available   11/18/20 (Cardiology) Meredith Leeds MD. Seen for Persistent Atrial Fibrillation. Started Metoprolol Succinate 50 mg daily. D/C Metoprolol Tartrate 25 mg.   Hospital visits:  None  Medications: Outpatient Encounter Medications as of 02/19/2021  Medication Sig   acetaminophen (TYLENOL) 650 MG CR tablet Take 1,950 mg by mouth in the morning and at bedtime.   benzonatate (TESSALON) 200 MG capsule Take 1 capsule (200 mg total) by mouth 3 (three) times daily as needed for cough.   chlorthalidone (HYGROTON) 25 MG tablet TAKE ONE (1) TABLET BY MOUTH ONCE DAILY   dicyclomine (BENTYL) 20 MG tablet Take 1 tablet (20 mg total) by mouth 3 (three) times daily before meals.   fluticasone (FLONASE) 50 MCG/ACT nasal spray Place 2 sprays into both nostrils daily.   losartan (COZAAR) 100 MG tablet TAKE ONE (1) TABLET BY MOUTH ONCE DAILY   meclizine (ANTIVERT) 25 MG tablet Take 1 tablet (25 mg total) by mouth 3 (three) times daily as needed for dizziness.   metoprolol succinate (TOPROL-XL) 50 MG 24 hr tablet Take 1 tablet (50 mg total) by mouth daily. Take with or immediately following a meal.   pantoprazole (PROTONIX) 40 MG tablet Take 1 tablet (40 mg total) by mouth daily.   rivaroxaban (XARELTO) 20 MG TABS tablet TAKE ONE (1) TABLET ONCE DAILY WITH SUPPER   rosuvastatin (CRESTOR) 10 MG tablet TAKE 1  TABLET BY MOUTH EVERY DAY IN THE EVENING.   No facility-administered encounter medications on file as of 02/19/2021.   Contacted [name] for general disease state and medication adherence call.    Patient is not > 5 days past due for refill on the following medications per chart history:   Star Medications: Medication Name/mg            Last Fill          Days Supply Rosuvastatin 10 mg              11/26/20 90ds 08/19/20            90ds Losartan 100 mg                    01/06/21 90ds  10/08/20             90ds     What concerns do you have about your medications? Pt has no concerns    The patient denies side effects with her medications.    How often do you forget or accidentally miss a dose? Never   Do you use a pillbox? Yes   Are you having any problems getting your medications from your pharmacy? No issues    Has the cost of your medications been a concern? No issues    Since last visit with CPP, no interventions have been made:    The patient has not  had an ED visit since last contact.    The patient denies problems with their health.    BP readings: 02/20/21 100/80 at the doctors office. Pt still only checks BP when she feels its high. She stated is usually is always good.     Care Gaps: Last annual wellness visit? None noted  Mammogram: 03/04/20 Dexa Scan: 03/13/20 Colonoscopy: 09/04/16      [name], [place] Pharmacist Assistant  [phone]

## 2021-02-20 ENCOUNTER — Ambulatory Visit (INDEPENDENT_AMBULATORY_CARE_PROVIDER_SITE_OTHER): Payer: PPO | Admitting: Cardiology

## 2021-02-20 ENCOUNTER — Encounter: Payer: Self-pay | Admitting: Cardiology

## 2021-02-20 ENCOUNTER — Other Ambulatory Visit: Payer: Self-pay

## 2021-02-20 ENCOUNTER — Telehealth: Payer: Self-pay | Admitting: *Deleted

## 2021-02-20 VITALS — BP 124/80 | HR 68 | Ht 62.25 in | Wt 261.0 lb

## 2021-02-20 DIAGNOSIS — I480 Paroxysmal atrial fibrillation: Secondary | ICD-10-CM | POA: Diagnosis not present

## 2021-02-20 DIAGNOSIS — G4733 Obstructive sleep apnea (adult) (pediatric): Secondary | ICD-10-CM

## 2021-02-20 NOTE — Progress Notes (Addendum)
Cardiology Office Note:    Date:  02/20/2021   ID:  Helen West, DOB 09/05/1955, MRN [id number]  PCP:  [name], MD  Cardiologist:  None    Referring MD: [name], MD   Chief Complaint  Patient presents with   Sleep Apnea   Hypertension    History of Present Illness:    Helen West is a 66 y.o. female with a hx of paroxysmal atrial fibrillation and hyperlipidemia.  She is followed by Dr. Curt Bears and underwent A. fib ablation on 05/15/2020.  Due to atrial fibrillation she underwent sleep study on 06/14/2020 which showed moderate obstructive sleep apnea with an AHI of 23.7/hr and no significant central sleep apnea.  She had nocturnal hypoxemia with lowest oxygen 72% with moderate snoring.  She underwent CPAP titration to 18 cm H2O and is now here for follow-up.  She is having a hard time getting adjusted to her CPAP and recently has not been using it.  She has had issues with her sinuses and has been sick with URI sx recently.  She says when she tries to use it it will blow into her face and then she has to take it off.  She uses a FFM and says that it is really leaking.  She tried a nasal mask but that did not work either.  She feels the pressure is adequate. When she used the nasal mask the first few times it worked well but then started having problems with her nostril on the left getting irritated.  She denies any significant nasal dryness but has a lot of mouth dryness.  She does not think that he snores.     Past Medical History:  Diagnosis Date   A-fib ([place])    Anemia    Arthritis    Cancer ([place])    Complication of anesthesia    Loss of vocal/voice x 2 months   Dysrhythmia    Headache    High cholesterol    History of radiation therapy 01/12/2018   11/14, 11/21, 11/27, 12/4, 01/12/18:  Vaginal cuff, 6 Gy in 5 fractions for a total dose of 30 Gy  Dr Gery Pray   Obesity    Post-menopausal bleeding     Past Surgical History:  Procedure Laterality Date    ATRIAL FIBRILLATION ABLATION N/A 05/15/2020   Procedure: ATRIAL FIBRILLATION ABLATION;  Surgeon: [name], MD;  Location: [facility];  Service: Cardiovascular;  Laterality: N/A;   ROBOTIC ASSISTED TOTAL HYSTERECTOMY WITH BILATERAL SALPINGO OOPHERECTOMY Bilateral 11/02/2017   Procedure: XI ROBOTIC ASSISTED TOTAL HYSTERECTOMY WITH BILATERAL SALPINGO OOPHORECTOMY;  Surgeon: [name], MD;  Location: [facility];  Service: Gynecology;  Laterality: Bilateral;   SENTINEL NODE BIOPSY N/A 11/02/2017   Procedure: SENTINEL NODE BIOPSY;  Surgeon: [name], MD;  Location: [facility];  Service: Gynecology;  Laterality: N/A;   TONSILLECTOMY     TOTAL HIP ARTHROPLASTY     TUBAL LIGATION      Current Medications: Current Meds  Medication Sig   acetaminophen (TYLENOL) 650 MG CR tablet Take 1,950 mg by mouth in the morning and at bedtime.   benzonatate (TESSALON) 200 MG capsule Take 1 capsule (200 mg total) by mouth 3 (three) times daily as needed for cough.   chlorthalidone (HYGROTON) 25 MG tablet TAKE ONE (1) TABLET BY MOUTH ONCE DAILY   fluticasone (FLONASE) 50 MCG/ACT nasal spray Place 2 sprays into both nostrils daily.   losartan (COZAAR) 100 MG tablet TAKE  ONE (1) TABLET BY MOUTH ONCE DAILY   meclizine (ANTIVERT) 25 MG tablet Take 1 tablet (25 mg total) by mouth 3 (three) times daily as needed for dizziness.   metoprolol succinate (TOPROL-XL) 50 MG 24 hr tablet Take 1 tablet (50 mg total) by mouth daily. Take with or immediately following a meal.   pantoprazole (PROTONIX) 40 MG tablet Take 1 tablet (40 mg total) by mouth daily.   rivaroxaban (XARELTO) 20 MG TABS tablet TAKE ONE (1) TABLET ONCE DAILY WITH SUPPER   rosuvastatin (CRESTOR) 10 MG tablet TAKE 1 TABLET BY MOUTH EVERY DAY IN THE EVENING.     Allergies:   Patient has no known allergies.   Social History   Socioeconomic History   Marital status: Married    Spouse name: Not on file   Number of children: Not on file   Years of  education: Not on file   Highest education level: Not on file  Occupational History   Not on file  Tobacco Use   Smoking status: Never   Smokeless tobacco: Never  Vaping Use   Vaping Use: Never used  Substance and Sexual Activity   Alcohol use: Yes    Comment: occasional social   Drug use: Never   Sexual activity: Not on file  Other Topics Concern   Not on file  Social History Narrative   Not on file   Social Determinants of Health   Financial Resource Strain: Not on file  Food Insecurity: Not on file  Transportation Needs: Not on file  Physical Activity: Not on file  Stress: Not on file  Social Connections: Not on file     Family History: The patient's Shefamily history includes Aneurysm in her mother; Cancer in her paternal uncle; Emphysema in her father.  ROS:   Please see the history of present illness.    ROS  All other systems reviewed and negative.   EKGs/Labs/Other Studies Reviewed:    The following studies were reviewed today: PSG, CPAP titration and PAP download  EKG:  EKG is not ordered today.    Recent Labs: 11/29/2020: ALT 7; BUN 24; Creatinine, Ser 0.83; Hemoglobin 13.4; Platelets 266; Potassium 4.5; Sodium 137   Recent Lipid Panel    Component Value Date/Time   CHOL 188 11/29/2020 0834   TRIG 145 11/29/2020 0834   HDL 51 11/29/2020 0834   CHOLHDL 3.7 11/29/2020 0834   LDLCALC 111 (H) 11/29/2020 0834     Physical Exam:    VS:  BP 124/80    Pulse 68    Ht 5' 2.25" (1.581 m)    Wt 261 lb (118.4 kg)    SpO2 96%    BMI 47.35 kg/m     Wt Readings from Last 3 Encounters:  02/20/21 261 lb (118.4 kg)  02/10/21 245 lb (111.1 kg)  12/25/20 262 lb (118.8 kg)     GEN:  Well nourished, well developed in no acute distress HEENT: Normal NECK: No JVD; No carotid bruits LYMPHATICS: No lymphadenopathy CARDIAC: RRR, no murmurs, rubs, gallops RESPIRATORY:  Clear to auscultation without rales, wheezing or rhonchi  ABDOMEN: Soft, non-tender,  non-distended MUSCULOSKELETAL:  No edema; No deformity  SKIN: Warm and dry NEUROLOGIC:  Alert and oriented x 3 PSYCHIATRIC:  Normal affect   ASSESSMENT:    1. OSA (obstructive sleep apnea)   2. PAF (paroxysmal atrial fibrillation) (HCC)    PLAN:    In order of problems listed above:  OSA - The patient is really  having problems getting adjusted to CPAP.  She has tried a FFM and nasal mask and both leak significantly.  The PAP download performed by his DME was personally reviewed and interpreted by me today and showed an AHI of 4.4/hr on 18 cm H2O with 44% compliance in using more than 4 hours nightly.  The patient has been using and benefiting from PAP use and will continue to benefit from therapy.  -I am going to change her PAP to auto CPAP from 4 to 20cm H2O to see if she tolerates the pressure better -I am going to get her an appt in person with DME to go over the device again to understand how to adjust the humidity as well as get a mask fitting done -I will see her back in 6 weeks   PAF -Managed by Dr. Curt Bears with EP -Due to ongoing episodes of atrial fibrillation she underwent A. fib ablation a year ago -She has had minimal episodes of breakthrough palpitations -She is maintaining normal sinus rhythm on exam today -Continue prescription drug management with Xarelto 20 mg daily and Toprol-XL 50 mg daily with as needed refills  Time Spent: 20 minutes total time of encounter, including 15 minutes spent in face-to-face patient care on the date of this encounter. This time includes coordination of care and counseling regarding above mentioned problem list. Remainder of non-face-to-face time involved reviewing chart documents/testing relevant to the patient encounter and documentation in the medical record. I have independently reviewed documentation from referring provider  Medication Adjustments/Labs and Tests Ordered: Current medicines are reviewed at length with the patient today.   Concerns regarding medicines are outlined above.  No orders of the defined types were placed in this encounter.  No orders of the defined types were placed in this encounter.   Signed, [name], MD  02/20/2021 10:12 AM    [place]

## 2021-02-20 NOTE — Telephone Encounter (Signed)
-----   Message from [place], [place] sent at 02/20/2021 10:25 AM EST ----- Per Dr. Radford Pax: Change her PAP to auto CPAP from 4 to 20cm H2O Get her an appt in person with DME to go over the device again to understand how to adjust the humidity as well as get a mask fitting done Thanks!

## 2021-02-20 NOTE — Telephone Encounter (Signed)
Per dr Radford Pax, Change her PAP to auto CPAP from 4 to 20cm H2O  Get her an appt in person with DME to go over the device again to understand how to adjust the humidity as well as get a mask fitting done

## 2021-02-20 NOTE — Patient Instructions (Signed)
Medication Instructions:  Your physician recommends that you continue on your current medications as directed. Please refer to the Current Medication list given to you today.  *If you need a refill on your cardiac medications before your next appointment, please call your pharmacy*  Follow-Up: At [facility], you and your health needs are our priority.  As part of our continuing mission to provide you with exceptional heart care, we have created designated Provider Care Teams.  These Care Teams include your primary Cardiologist (physician) and Advanced Practice Providers (APPs -  Physician Assistants and Nurse Practitioners) who all work together to provide you with the care you need, when you need it.  Your next appointment:   6 week(s)  The format for your next appointment:   In Person  Provider:   [name], MD  Other Instructions Dr. Radford Pax is going to change your PAP to auto CPAP from 4 to 20cm H2O. Gae Bon is going to set you up for a visit with your DME for a mask fitting.

## 2021-02-24 DIAGNOSIS — J069 Acute upper respiratory infection, unspecified: Secondary | ICD-10-CM

## 2021-02-24 DIAGNOSIS — J00 Acute nasopharyngitis [common cold]: Secondary | ICD-10-CM | POA: Insufficient documentation

## 2021-02-24 DIAGNOSIS — U071 COVID-19: Secondary | ICD-10-CM | POA: Insufficient documentation

## 2021-02-24 HISTORY — DX: Acute upper respiratory infection, unspecified: J06.9

## 2021-02-24 NOTE — Assessment & Plan Note (Addendum)
Your COVID test is positive. You should remain isolated and quarantine for at least 5 days from start of symptoms. You must be feeling better and be fever free without any fever reducers for at least 24 hours as well. You should wear a mask at all times when out of your home or around others for 5 days after leaving isolation.  Your household contacts should be tested as well as work contacts. If you feel worse or have increasing shortness of breath, you should be seen in person at urgent care or the emergency room.   Rx: molnupiravir. Recommend rest, fluids, 3 meals per day.  Fever reducing medicines.  OTC cold and congestion medicines. RX: TESSALON PERLES SENT.

## 2021-02-26 ENCOUNTER — Other Ambulatory Visit: Payer: Self-pay

## 2021-02-26 MED ORDER — ROSUVASTATIN CALCIUM 10 MG PO TABS: 10.0000 mg | ORAL_TABLET | Freq: Every day | ORAL | 0 refills | Status: DC

## 2021-03-10 ENCOUNTER — Telehealth: Payer: Self-pay | Admitting: *Deleted

## 2021-03-17 ENCOUNTER — Ambulatory Visit (INDEPENDENT_AMBULATORY_CARE_PROVIDER_SITE_OTHER): Payer: PPO | Admitting: Family Medicine

## 2021-03-17 VITALS — BP 130/70 | HR 72 | Temp 97.3°F | Resp 18 | Ht 60.5 in | Wt 257.0 lb

## 2021-03-17 DIAGNOSIS — R062 Wheezing: Secondary | ICD-10-CM

## 2021-03-17 DIAGNOSIS — H6692 Otitis media, unspecified, left ear: Secondary | ICD-10-CM

## 2021-03-17 DIAGNOSIS — H65192 Other acute nonsuppurative otitis media, left ear: Secondary | ICD-10-CM

## 2021-03-17 DIAGNOSIS — J208 Acute bronchitis due to other specified organisms: Secondary | ICD-10-CM | POA: Diagnosis not present

## 2021-03-17 MED ORDER — CEFDINIR 300 MG PO CAPS: 300.0000 mg | ORAL_CAPSULE | Freq: Two times a day (BID) | ORAL | 0 refills | Status: DC

## 2021-03-17 MED ORDER — ALBUTEROL SULFATE HFA 108 (90 BASE) MCG/ACT IN AERS: 2.0000 | INHALATION_SPRAY | Freq: Four times a day (QID) | RESPIRATORY_TRACT | 2 refills | Status: DC | PRN

## 2021-03-17 MED ORDER — HYDROCODONE BIT-HOMATROP MBR 5-1.5 MG/5ML PO SOLN: 5.0000 mL | Freq: Four times a day (QID) | ORAL | 0 refills | Status: DC | PRN

## 2021-03-17 NOTE — Patient Instructions (Signed)
Rx: cefdinir (antibiotic) Rx; hydromet cough syrup.  Rx: ventolin 2 puffs up to four times a day as needed wheezing.   May also take tessalon perles  May also take Delsym otc.

## 2021-03-17 NOTE — Progress Notes (Signed)
duplicate

## 2021-03-17 NOTE — Progress Notes (Signed)
Acute Office Visit  Subjective:    Patient ID: Helen West, female    DOB: [birth year], 66 y.o.   MRN: [id number]  Chief Complaint  Patient presents with   Cough    HPI: Patient is in today for fever and cough.  Her husband is sick with cough and she tested herself for covid on Thursday and was negative.   She has been experiencing cough, nasal congestion, left earache, wheezing and fever since Friday.  She repeated the home covid test again today and it was negative.    Past Medical History:  Diagnosis Date   A-fib ([place])    Anemia    Arthritis    Cancer ([place])    Complication of anesthesia    Loss of vocal/voice x 2 months   Dysrhythmia    Headache    High cholesterol    History of radiation therapy 01/12/2018   11/14, 11/21, 11/27, 12/4, 01/12/18:  Vaginal cuff, 6 Gy in 5 fractions for a total dose of 30 Gy  Dr Gery Pray   Obesity    Post-menopausal bleeding     Past Surgical History:  Procedure Laterality Date   ATRIAL FIBRILLATION ABLATION N/A 05/15/2020   Procedure: ATRIAL FIBRILLATION ABLATION;  Surgeon: [name], MD;  Location: [facility];  Service: Cardiovascular;  Laterality: N/A;   ROBOTIC ASSISTED TOTAL HYSTERECTOMY WITH BILATERAL SALPINGO OOPHERECTOMY Bilateral 11/02/2017   Procedure: XI ROBOTIC ASSISTED TOTAL HYSTERECTOMY WITH BILATERAL SALPINGO OOPHORECTOMY;  Surgeon: [name], MD;  Location: [facility];  Service: Gynecology;  Laterality: Bilateral;   SENTINEL NODE BIOPSY N/A 11/02/2017   Procedure: SENTINEL NODE BIOPSY;  Surgeon: [name], MD;  Location: [facility];  Service: Gynecology;  Laterality: N/A;   TONSILLECTOMY     TOTAL HIP ARTHROPLASTY     TUBAL LIGATION      Family History  Problem Relation Age of Onset   Aneurysm Mother    Emphysema Father    Cancer Paternal Uncle     Social History   Socioeconomic History   Marital status: Married    Spouse name: Not on file   Number of children: Not on file   Years of  education: Not on file   Highest education level: Not on file  Occupational History   Not on file  Tobacco Use   Smoking status: Never   Smokeless tobacco: Never  Vaping Use   Vaping Use: Never used  Substance and Sexual Activity   Alcohol use: Yes    Comment: occasional social   Drug use: Never   Sexual activity: Not on file  Other Topics Concern   Not on file  Social History Narrative   Not on file   Social Determinants of Health   Financial Resource Strain: Not on file  Food Insecurity: Not on file  Transportation Needs: Not on file  Physical Activity: Not on file  Stress: Not on file  Social Connections: Not on file  Intimate Partner Violence: Not on file    Outpatient Medications Prior to Visit  Medication Sig Dispense Refill   acetaminophen (TYLENOL) 650 MG CR tablet Take 1,950 mg by mouth in the morning and at bedtime.     benzonatate (TESSALON) 200 MG capsule Take 1 capsule (200 mg total) by mouth 3 (three) times daily as needed for cough. 30 capsule 0   chlorthalidone (HYGROTON) 25 MG tablet TAKE ONE (1) TABLET BY MOUTH ONCE DAILY 90 tablet 1   fluticasone (FLONASE) 50  MCG/ACT nasal spray Place 2 sprays into both nostrils daily. 16 g 6   losartan (COZAAR) 100 MG tablet TAKE ONE (1) TABLET BY MOUTH ONCE DAILY 90 tablet 1   meclizine (ANTIVERT) 25 MG tablet Take 1 tablet (25 mg total) by mouth 3 (three) times daily as needed for dizziness. 30 tablet 0   metoprolol succinate (TOPROL-XL) 50 MG 24 hr tablet Take 1 tablet (50 mg total) by mouth daily. Take with or immediately following a meal. 30 tablet 6   pantoprazole (PROTONIX) 40 MG tablet Take 1 tablet (40 mg total) by mouth daily. 30 tablet 3   rivaroxaban (XARELTO) 20 MG TABS tablet TAKE ONE (1) TABLET ONCE DAILY WITH SUPPER 90 tablet 1   rosuvastatin (CRESTOR) 10 MG tablet Take 1 tablet (10 mg total) by mouth daily. 90 tablet 0   No facility-administered medications prior to visit.    No Known  Allergies  Review of Systems  Constitutional:  Positive for chills, fatigue and fever.  HENT:  Positive for ear pain, sinus pressure and sinus pain. Negative for sore throat.   Respiratory:  Positive for cough, shortness of breath and wheezing.   Cardiovascular:  Negative for chest pain and palpitations.  Gastrointestinal:  Positive for nausea. Negative for vomiting.  Neurological:  Positive for headaches.      Objective:    Physical Exam Vitals reviewed.  Constitutional:      Appearance: Normal appearance.  HENT:     Right Ear: Tympanic membrane, ear canal and external ear normal.     Left Ear: Ear canal and external ear normal.     Ears:     Comments: Left TM erythema    Nose: Congestion present.     Comments: Sinus tenderness    Mouth/Throat:     Pharynx: Oropharynx is clear. No oropharyngeal exudate or posterior oropharyngeal erythema.  Cardiovascular:     Rate and Rhythm: Normal rate and regular rhythm.     Heart sounds: Normal heart sounds. No murmur heard. Pulmonary:     Effort: Pulmonary effort is normal. No respiratory distress.     Breath sounds: Wheezing (upper lobes BL.) present.  Duoneb treatment given and her wheezing improved, but did not resolve.  Lymphadenopathy:     Cervical: Cervical adenopathy present.  Neurological:     Mental Status: She is alert and oriented to person, place, and time.  Psychiatric:        Mood and Affect: Mood normal.        Behavior: Behavior normal.    BP 130/70    Pulse 72    Temp (!) 97.3 F (36.3 C)    Resp 18    Ht 5' 0.5" (1.537 m)    Wt 257 lb (116.6 kg)    SpO2 96%    BMI 49.37 kg/m  Wt Readings from Last 3 Encounters:  03/17/21 257 lb (116.6 kg)  02/20/21 261 lb (118.4 kg)  02/10/21 245 lb (111.1 kg)    Health Maintenance Due  Topic Date Due   Hepatitis C Screening  Never done   TETANUS/TDAP  Never done   Zoster Vaccines- Shingrix (1 of 2) Never done   COVID-19 Vaccine (4 - Booster for Moderna series)  12/05/2019   Pneumonia Vaccine 31+ Years old (2 - PCV) 02/12/2021    There are no preventive care reminders to display for this patient.   Lab Results  Component Value Date   TSH 2.440 02/13/2020   Lab Results  Component  Value Date   WBC 5.4 11/29/2020   HGB 13.4 11/29/2020   HCT 41.4 11/29/2020   MCV 95 11/29/2020   PLT 266 11/29/2020   Lab Results  Component Value Date   NA 137 11/29/2020   K 4.5 11/29/2020   CO2 31 (H) 11/29/2020   GLUCOSE 110 (H) 11/29/2020   BUN 24 11/29/2020   CREATININE 0.83 11/29/2020   BILITOT 0.4 11/29/2020   ALKPHOS 86 11/29/2020   AST 15 11/29/2020   ALT 7 11/29/2020   PROT 6.9 11/29/2020   ALBUMIN 4.4 11/29/2020   CALCIUM 9.7 11/29/2020   ANIONGAP 8 11/03/2017   EGFR 77 11/29/2020   Lab Results  Component Value Date   CHOL 188 11/29/2020   Lab Results  Component Value Date   HDL 51 11/29/2020   Lab Results  Component Value Date   LDLCALC 111 (H) 11/29/2020   Lab Results  Component Value Date   TRIG 145 11/29/2020   Lab Results  Component Value Date   CHOLHDL 3.7 11/29/2020   Lab Results  Component Value Date   HGBA1C 6.2 (H) 11/29/2020       Assessment & Plan:   Problem List Items Addressed This Visit       Respiratory   Acute bronchitis - Primary    Rx: hydromet and albuterol hfa 2 puffs four times a day as needed wheezing.  May also take tessalon perles  May also take Delsym otc.        Nervous and Auditory   Left acute otitis media    Rx: cefdinir.      Relevant Medications   cefdinir (OMNICEF) 300 MG capsule    Meds ordered this encounter  Medications   cefdinir (OMNICEF) 300 MG capsule    Sig: Take 1 capsule (300 mg total) by mouth 2 (two) times daily.    Dispense:  20 capsule    Refill:  0   albuterol (VENTOLIN HFA) 108 (90 Base) MCG/ACT inhaler    Sig: Inhale 2 puffs into the lungs every 6 (six) hours as needed for wheezing or shortness of breath.    Dispense:  8 g    Refill:  2    HYDROcodone bit-homatropine (HYDROMET) 5-1.5 MG/5ML syrup    Sig: Take 5 mLs by mouth every 6 (six) hours as needed for cough.    Dispense:  120 mL    Refill:  0      Follow-up: Return if symptoms worsen or fail to improve.  An After Visit Summary was printed and given to the patient.  [name], MD [facility] [phone]

## 2021-03-25 ENCOUNTER — Telehealth: Payer: Self-pay | Admitting: *Deleted

## 2021-03-25 ENCOUNTER — Encounter: Payer: Self-pay | Admitting: Family Medicine

## 2021-03-25 DIAGNOSIS — J209 Acute bronchitis, unspecified: Secondary | ICD-10-CM | POA: Insufficient documentation

## 2021-03-25 DIAGNOSIS — H6692 Otitis media, unspecified, left ear: Secondary | ICD-10-CM | POA: Insufficient documentation

## 2021-03-25 NOTE — Assessment & Plan Note (Signed)
Rx: cefdinir.

## 2021-03-25 NOTE — Assessment & Plan Note (Addendum)
Rx: hydromet and albuterol hfa 2 puffs four times a day as needed wheezing.  May also take tessalon perles  May also take Delsym otc.

## 2021-03-31 DIAGNOSIS — G4733 Obstructive sleep apnea (adult) (pediatric): Secondary | ICD-10-CM | POA: Diagnosis not present

## 2021-04-04 DIAGNOSIS — G4733 Obstructive sleep apnea (adult) (pediatric): Secondary | ICD-10-CM | POA: Diagnosis not present

## 2021-04-11 ENCOUNTER — Ambulatory Visit (INDEPENDENT_AMBULATORY_CARE_PROVIDER_SITE_OTHER): Payer: PPO | Admitting: Cardiology

## 2021-04-11 ENCOUNTER — Telehealth: Payer: Self-pay | Admitting: *Deleted

## 2021-04-11 ENCOUNTER — Encounter: Payer: Self-pay | Admitting: Cardiology

## 2021-04-11 ENCOUNTER — Other Ambulatory Visit: Payer: Self-pay

## 2021-04-11 VITALS — BP 130/77 | HR 73 | Ht 62.0 in | Wt 261.4 lb

## 2021-04-11 DIAGNOSIS — I480 Paroxysmal atrial fibrillation: Secondary | ICD-10-CM | POA: Diagnosis not present

## 2021-04-11 DIAGNOSIS — G4733 Obstructive sleep apnea (adult) (pediatric): Secondary | ICD-10-CM | POA: Diagnosis not present

## 2021-04-11 NOTE — Telephone Encounter (Signed)
Good AHI but needs to improve compliance as we discussed ?

## 2021-04-11 NOTE — Telephone Encounter (Signed)
Reached out to [facility] and made appointment for patient to get help with adjusting her humidity. Someone from [employer] will call the patient. ? ?

## 2021-04-11 NOTE — Telephone Encounter (Signed)
-----   Message from Antonieta Iba, RN sent at 04/11/2021  8:40 AM EST ----- ?Please set patient up for an appointment with her DME for CPAP teaching. She does not understand how to adjust her humidity and DME refused to see her.  ?Thanks! ? ?

## 2021-04-11 NOTE — Progress Notes (Signed)
?Cardiology Office Note:   ? ?Date:  04/11/2021  ? ?ID:  Helen West, DOB [birth year], MRN [id number] ? ?PCP:  [name], MD  ?Cardiologist:  None   ? ?Referring MD: [name], MD  ? ?Chief Complaint  ?Patient presents with  ? Sleep Apnea  ? Atrial Fibrillation  ? ? ?History of Present Illness:   ? ?Helen West is a 66 y.o. female with a hx of paroxysmal atrial fibrillation and hyperlipidemia.  She is followed by Dr. Curt Bears and underwent A. fib ablation on 05/15/2020.  Due to atrial fibrillation she underwent sleep study on 06/14/2020 which showed moderate obstructive sleep apnea with an AHI of 23.7/hr and no significant central sleep apnea.  She had nocturnal hypoxemia with lowest oxygen 72% with moderate snoring.  She underwent CPAP titration to 18 cm H2O and is now here for follow-up. ? ?When I last saw her she was having a hard time getting adjusted to her CPAP and had not been using it.  She has had issues with her sinuses.  She complained that the ER would blow into her face and then she has to take her mask off.  She uses a FFM and said it was leaking badly she tried a nasal mask but that did not work either.  I changed her CPAP to auto from 4 to 20 cm H2O and made her an appointment with DME to go over how to adjust humidification.  She is now back for follow-up. ? ?She is doing well with her CPAP device and thinks that she has gotten used to it.  She tolerates the mask and feels the pressure is adequate.  Since going on CPAP she feels rested in the am and has no significant daytime sleepiness.  She denies any significant nasal dryness or nasal congestion.  She has a lot of mouth dryness and does not understand how to adjust the humidity and the DME would not see her for an OV.  She does not think that she snores.    ? ?Past Medical History:  ?Diagnosis Date  ? A-fib ([place])   ? Anemia   ? Arthritis   ? Cancer [facility])   ? Complication of anesthesia   ? Loss of vocal/voice x 2 months  ? Dysrhythmia   ?  Headache   ? High cholesterol   ? History of radiation therapy 01/12/2018  ? 11/14, 11/21, 11/27, 12/4, 01/12/18:  Vaginal cuff, 6 Gy in 5 fractions for a total dose of 30 Gy  Dr Gery Pray  ? Obesity   ? Post-menopausal bleeding   ? ? ?Past Surgical History:  ?Procedure Laterality Date  ? ATRIAL FIBRILLATION ABLATION N/A 05/15/2020  ? Procedure: ATRIAL FIBRILLATION ABLATION;  Surgeon: [name], MD;  Location: [facility];  Service: Cardiovascular;  Laterality: N/A;  ? ROBOTIC ASSISTED TOTAL HYSTERECTOMY WITH BILATERAL SALPINGO OOPHERECTOMY Bilateral 11/02/2017  ? Procedure: XI ROBOTIC ASSISTED TOTAL HYSTERECTOMY WITH BILATERAL SALPINGO OOPHORECTOMY;  Surgeon: [name], MD;  Location: [facility];  Service: Gynecology;  Laterality: Bilateral;  ? SENTINEL NODE BIOPSY N/A 11/02/2017  ? Procedure: SENTINEL NODE BIOPSY;  Surgeon: [name], MD;  Location: [facility];  Service: Gynecology;  Laterality: N/A;  ? TONSILLECTOMY    ? TOTAL HIP ARTHROPLASTY    ? TUBAL LIGATION    ? ? ?Current Medications: ?Current Meds  ?Medication Sig  ? acetaminophen (TYLENOL) 650 MG CR tablet Take 1,950 mg by mouth in the morning and at  bedtime.  ? albuterol (VENTOLIN HFA) 108 (90 Base) MCG/ACT inhaler Inhale 2 puffs into the lungs every 6 (six) hours as needed for wheezing or shortness of breath.  ? cefdinir (OMNICEF) 300 MG capsule Take 1 capsule (300 mg total) by mouth 2 (two) times daily.  ? chlorthalidone (HYGROTON) 25 MG tablet TAKE ONE (1) TABLET BY MOUTH ONCE DAILY  ? fluticasone (FLONASE) 50 MCG/ACT nasal spray Place 2 sprays into both nostrils daily.  ? HYDROcodone bit-homatropine (HYDROMET) 5-1.5 MG/5ML syrup Take 5 mLs by mouth every 6 (six) hours as needed for cough.  ? losartan (COZAAR) 100 MG tablet TAKE ONE (1) TABLET BY MOUTH ONCE DAILY  ? meclizine (ANTIVERT) 25 MG tablet Take 1 tablet (25 mg total) by mouth 3 (three) times daily as needed for dizziness.  ? metoprolol succinate (TOPROL-XL) 50 MG 24 hr tablet  Take 1 tablet (50 mg total) by mouth daily. Take with or immediately following a meal.  ? pantoprazole (PROTONIX) 40 MG tablet Take 1 tablet (40 mg total) by mouth daily.  ? rivaroxaban (XARELTO) 20 MG TABS tablet TAKE ONE (1) TABLET ONCE DAILY WITH SUPPER  ? rosuvastatin (CRESTOR) 10 MG tablet Take 1 tablet (10 mg total) by mouth daily.  ?  ? ?Allergies:   Patient has no known allergies.  ? ?Social History  ? ?Socioeconomic History  ? Marital status: Married  ?  Spouse name: Not on file  ? Number of children: Not on file  ? Years of education: Not on file  ? Highest education level: Not on file  ?Occupational History  ? Not on file  ?Tobacco Use  ? Smoking status: Never  ? Smokeless tobacco: Never  ?Vaping Use  ? Vaping Use: Never used  ?Substance and Sexual Activity  ? Alcohol use: Yes  ?  Comment: occasional social  ? Drug use: Never  ? Sexual activity: Not on file  ?Other Topics Concern  ? Not on file  ?Social History Narrative  ? Not on file  ? ?Social Determinants of Health  ? ?Financial Resource Strain: Not on file  ?Food Insecurity: Not on file  ?Transportation Needs: Not on file  ?Physical Activity: Not on file  ?Stress: Not on file  ?Social Connections: Not on file  ?  ? ?Family History: ?The patient's Shefamily history includes Aneurysm in her mother; Cancer in her paternal uncle; Emphysema in her father. ? ?ROS:   ?Please see the history of present illness.    ?ROS  ?All other systems reviewed and negative.  ? ?EKGs/Labs/Other Studies Reviewed:   ? ?The following studies were reviewed today: ?PSG, CPAP titration and PAP download ? ?EKG:  EKG is not ordered today ? ?Recent Labs: ?11/29/2020: ALT 7; BUN 24; Creatinine, Ser 0.83; Hemoglobin 13.4; Platelets 266; Potassium 4.5; Sodium 137  ? ?Recent Lipid Panel ?   ?Component Value Date/Time  ? CHOL 188 11/29/2020 0834  ? TRIG 145 11/29/2020 0834  ? HDL 51 11/29/2020 0834  ? CHOLHDL 3.7 11/29/2020 0834  ? [place] 111 (H) 11/29/2020 1093  ? ? ? ?Physical Exam:    ? ?VS:  BP 130/77   Pulse 73   Ht '5\' 2"'$  (1.575 m)   Wt 261 lb 6.4 oz (118.6 kg)   SpO2 97%   BMI 47.81 kg/m?    ? ?Wt Readings from Last 3 Encounters:  ?04/11/21 261 lb 6.4 oz (118.6 kg)  ?03/17/21 257 lb (116.6 kg)  ?02/20/21 261 lb (118.4 kg)  ?  ?GEN:  Well nourished, well developed in no acute distress ?HEENT: Normal ?NECK: No JVD; No carotid bruits ?LYMPHATICS: No lymphadenopathy ?CARDIAC:RRR, no murmurs, rubs, gallops ?RESPIRATORY:  Clear to auscultation without rales, wheezing or rhonchi  ?ABDOMEN: Soft, non-tender, non-distended ?MUSCULOSKELETAL:  No edema; No deformity  ?SKIN: Warm and dry ?NEUROLOGIC:  Alert and oriented x 3 ?PSYCHIATRIC:  Normal affect   ?ASSESSMENT:   ? ?1. OSA (obstructive sleep apnea)   ?2. PAF (paroxysmal atrial fibrillation) ([place])   ? ?PLAN:   ? ?In order of problems listed above: ? ?OSA - The patient is tolerating PAP therapy well without any problems. The patient has been using and benefiting from PAP use and will continue to benefit from therapy.  ?-I will get a download from her DME ?-I will set up an appt with DME to review how to adjust her humidification ?   ?PAF ?-Managed by Dr. Curt Bears with EP ?-Due to ongoing episodes of atrial fibrillation she underwent A. fib ablation a year ago ?-She is maintaining normal sinus rhythm on exam today denies any significant palpitations ?-Continue prescription drug management with Xarelto 20 mg daily and Toprol-XL 50 mg daily with as needed refill ? ? ?Medication Adjustments/Labs and Tests Ordered: ?Current medicines are reviewed at length with the patient today.  Concerns regarding medicines are outlined above.  ?No orders of the defined types were placed in this encounter. ? ?No orders of the defined types were placed in this encounter. ? ? ?Signed, ?[name], MD  ?04/11/2021 8:31 AM    ?[place] ?

## 2021-04-11 NOTE — Telephone Encounter (Signed)
Patient understands her AHI showed normal and needs to improve compliance as we discussed. ?Pt is aware and agreeable to results.    ?

## 2021-04-11 NOTE — Patient Instructions (Signed)
Medication Instructions:  ?Your physician recommends that you continue on your current medications as directed. Please refer to the Current Medication list given to you today. ? ?*If you need a refill on your cardiac medications before your next appointment, please call your pharmacy* ? ?Follow-Up: ?At [facility], you and your health needs are our priority.  As part of our continuing mission to provide you with exceptional heart care, we have created designated Provider Care Teams.  These Care Teams include your primary Cardiologist (physician) and Advanced Practice Providers (APPs -  Physician Assistants and Nurse Practitioners) who all work together to provide you with the care you need, when you need it. ? ?Your next appointment:   ?1 year(s) ? ?The format for your next appointment:   ?In Person ? ?Provider:   ?[name], MD ? ?Other Instructions ?Gae Bon will be in contact with you to set you up for an appointment with your DME  ?

## 2021-04-28 DIAGNOSIS — G4733 Obstructive sleep apnea (adult) (pediatric): Secondary | ICD-10-CM | POA: Diagnosis not present

## 2021-05-29 ENCOUNTER — Other Ambulatory Visit: Payer: Self-pay | Admitting: Family Medicine

## 2021-05-29 DIAGNOSIS — G4733 Obstructive sleep apnea (adult) (pediatric): Secondary | ICD-10-CM | POA: Diagnosis not present

## 2021-05-29 NOTE — Telephone Encounter (Signed)
Refill sent to pharmacy.   

## 2021-06-10 ENCOUNTER — Other Ambulatory Visit: Payer: Self-pay

## 2021-06-10 MED ORDER — METOPROLOL SUCCINATE ER 50 MG PO TB24: 50.0000 mg | ORAL_TABLET | Freq: Every day | ORAL | 5 refills | Status: DC

## 2021-06-23 ENCOUNTER — Ambulatory Visit: Payer: PPO | Admitting: Cardiology

## 2021-06-25 ENCOUNTER — Other Ambulatory Visit: Payer: Self-pay | Admitting: Family Medicine

## 2021-06-25 DIAGNOSIS — I10 Essential (primary) hypertension: Secondary | ICD-10-CM

## 2021-06-28 DIAGNOSIS — G4733 Obstructive sleep apnea (adult) (pediatric): Secondary | ICD-10-CM | POA: Diagnosis not present

## 2021-07-01 ENCOUNTER — Other Ambulatory Visit: Payer: Self-pay | Admitting: Family Medicine

## 2021-07-01 DIAGNOSIS — I10 Essential (primary) hypertension: Secondary | ICD-10-CM

## 2021-07-15 DIAGNOSIS — G4733 Obstructive sleep apnea (adult) (pediatric): Secondary | ICD-10-CM | POA: Diagnosis not present

## 2021-07-29 DIAGNOSIS — G4733 Obstructive sleep apnea (adult) (pediatric): Secondary | ICD-10-CM | POA: Diagnosis not present

## 2021-08-04 ENCOUNTER — Ambulatory Visit: Payer: PPO | Admitting: Cardiology

## 2021-08-04 ENCOUNTER — Encounter: Payer: Self-pay | Admitting: Cardiology

## 2021-08-04 VITALS — BP 118/70 | HR 59 | Ht 62.0 in | Wt 260.8 lb

## 2021-08-04 DIAGNOSIS — D6869 Other thrombophilia: Secondary | ICD-10-CM

## 2021-08-04 DIAGNOSIS — I4819 Other persistent atrial fibrillation: Secondary | ICD-10-CM

## 2021-08-04 NOTE — Progress Notes (Signed)
Electrophysiology Office Note   Date:  08/04/2021   ID:  West, Helen [birth year], MRN [id number]  PCP:  [name], MD  Cardiologist:  Debara Pickett Primary Electrophysiologist:  Severino Paolo Meredith Leeds, MD    Chief Complaint: palpitations   History of Present Illness: Helen West is a 66 y.o. female who is being seen today for the evaluation of atrial fibrillation at the request of [name], [name], MD. Presenting today for electrophysiology evaluation.  She has a history significant for atrial fibrillation, hyperlipidemia, hypertension, obesity.  She has sleep apnea and wears a CPAP.  She had an atrial fibrillation ablation 05/15/2020.  She had a few episodes of atrial fibrillation since then.  She has also been treated with her CPAP and has noticed a big difference in her level of fatigue since appropriately treating her sleep apnea.  Today, denies symptoms of palpitations, chest pain, shortness of breath, orthopnea, PND, lower extremity edema, claudication, dizziness, presyncope, syncope, bleeding, or neurologic sequela. The patient is tolerating medications without difficulties.      Past Medical History:  Diagnosis Date   A-fib ([place])    Anemia    Arthritis    Cancer ([employer])    Complication of anesthesia    Loss of vocal/voice x 2 months   Dysrhythmia    Headache    High cholesterol    History of radiation therapy 01/12/2018   11/14, 11/21, 11/27, 12/4, 01/12/18:  Vaginal cuff, 6 Gy in 5 fractions for a total dose of 30 Gy  Dr Gery Pray   Obesity    Post-menopausal bleeding    Past Surgical History:  Procedure Laterality Date   ATRIAL FIBRILLATION ABLATION N/A 05/15/2020   Procedure: ATRIAL FIBRILLATION ABLATION;  Surgeon: [name], MD;  Location: [facility];  Service: Cardiovascular;  Laterality: N/A;   ROBOTIC ASSISTED TOTAL HYSTERECTOMY WITH BILATERAL SALPINGO OOPHERECTOMY Bilateral 11/02/2017   Procedure: XI ROBOTIC ASSISTED TOTAL HYSTERECTOMY WITH  BILATERAL SALPINGO OOPHORECTOMY;  Surgeon: [name], MD;  Location: [facility];  Service: Gynecology;  Laterality: Bilateral;   SENTINEL NODE BIOPSY N/A 11/02/2017   Procedure: SENTINEL NODE BIOPSY;  Surgeon: [name], MD;  Location: [facility];  Service: Gynecology;  Laterality: N/A;   TONSILLECTOMY     TOTAL HIP ARTHROPLASTY     TUBAL LIGATION       Current Outpatient Medications  Medication Sig Dispense Refill   acetaminophen (TYLENOL) 650 MG CR tablet Take 1,950 mg by mouth in the morning and at bedtime.     albuterol (VENTOLIN HFA) 108 (90 Base) MCG/ACT inhaler Inhale 2 puffs into the lungs every 6 (six) hours as needed for wheezing or shortness of breath. 8 g 2   cefdinir (OMNICEF) 300 MG capsule Take 1 capsule (300 mg total) by mouth 2 (two) times daily. 20 capsule 0   chlorthalidone (HYGROTON) 25 MG tablet TAKE 1 TABLET BY MOUTH DAILY 90 tablet 0   fluticasone (FLONASE) 50 MCG/ACT nasal spray Place 2 sprays into both nostrils daily. 16 g 6   HYDROcodone bit-homatropine (HYDROMET) 5-1.5 MG/5ML syrup Take 5 mLs by mouth every 6 (six) hours as needed for cough. 120 mL 0   losartan (COZAAR) 100 MG tablet TAKE 1 TABLET BY MOUTH DAILY 90 tablet 0   meclizine (ANTIVERT) 25 MG tablet Take 1 tablet (25 mg total) by mouth 3 (three) times daily as needed for dizziness. 30 tablet 0   metoprolol succinate (TOPROL-XL) 50 MG 24 hr tablet Take 1 tablet (  50 mg total) by mouth daily. Take with or immediately following a meal. 30 tablet 5   pantoprazole (PROTONIX) 40 MG tablet Take 1 tablet (40 mg total) by mouth daily. 30 tablet 3   rivaroxaban (XARELTO) 20 MG TABS tablet TAKE ONE (1) TABLET ONCE DAILY WITH SUPPER 90 tablet 1   rosuvastatin (CRESTOR) 10 MG tablet TAKE 1 TABLET BY MOUTH DAILY 90 tablet 0   No current facility-administered medications for this visit.    Allergies:   Patient has no known allergies.   Social History:  The patient  reports that she has never smoked. She has never used  smokeless tobacco. She reports current alcohol use. She reports that she does not use drugs.   Family History:  The patient's family history includes Aneurysm in her mother; Cancer in her paternal uncle; Emphysema in her father.   ROS:  Please see the history of present illness.   Otherwise, review of systems is positive for none.   All other systems are reviewed and negative.   PHYSICAL EXAM: VS:  BP 118/70   Pulse (!) 59   Ht '5\' 2"'$  (1.575 m)   Wt 260 lb 12.8 oz (118.3 kg)   SpO2 97%   BMI 47.70 kg/m  , BMI Body mass index is 47.7 kg/m. GEN: Well nourished, well developed, in no acute distress  HEENT: normal  Neck: no JVD, carotid bruits, or masses Cardiac: RRR; no murmurs, rubs, or gallops,no edema  Respiratory:  clear to auscultation bilaterally, normal work of breathing GI: soft, nontender, nondistended, + BS MS: no deformity or atrophy  Skin: warm and dry Neuro:  Strength and sensation are intact Psych: euthymic mood, full affect  EKG:  EKG is ordered today. Personal review of the ekg ordered shows sinus rhythm   Recent Labs: 11/29/2020: ALT 7; BUN 24; Creatinine, Ser 0.83; Hemoglobin 13.4; Platelets 266; Potassium 4.5; Sodium 137    Lipid Panel     Component Value Date/Time   CHOL 188 11/29/2020 0834   TRIG 145 11/29/2020 0834   HDL 51 11/29/2020 0834   CHOLHDL 3.7 11/29/2020 0834   LDLCALC 111 (H) 11/29/2020 0834     Wt Readings from Last 3 Encounters:  08/04/21 260 lb 12.8 oz (118.3 kg)  04/11/21 261 lb 6.4 oz (118.6 kg)  03/17/21 257 lb (116.6 kg)      Other studies Reviewed: Additional studies/ records that were reviewed today include: Monitor 01/17/19  Review of the above records today demonstrates:  Monitor shows PAF/flutter with possible aberrancy vs. NSVT (up to 12 beats).   ASSESSMENT AND PLAN:  1.  Persistent atrial fibrillation: Currently on Xarelto 20 mg daily.  CHA2DS2-VASc of 2.  Status post ablation 05/15/2020.  She has had minimal  episodes of atrial fibrillation since her ablation.  Overall happy with her control.  No changes.  2.  Hypertension: Currently well controlled  3.  Obstructive sleep apnea: CPAP compliance encouraged  4.  Morbid obesity: Diet and exercise encouraged.  Of offered [facility] weight loss clinic but she would like to try on her own. Body mass index is 47.7 kg/m.  5.  Secondary hypercoagulable state: Currently on Xarelto for atrial fibrillation as above  Current medicines are reviewed at length with the patient today.   The patient does not have concerns regarding her medicines.  The following changes were made today: none  Labs/ tests ordered today include:  Orders Placed This Encounter  Procedures   EKG 12-Lead  Disposition:   FU with Ether Goebel 12 months  Signed, Livio Ledwith Meredith Leeds, MD  08/04/2021 8:54 AM     [facility] [address] [place] [address] [phone] (office) [phone] (fax)

## 2021-08-09 ENCOUNTER — Other Ambulatory Visit: Payer: Self-pay | Admitting: Family Medicine

## 2021-08-09 ENCOUNTER — Other Ambulatory Visit: Payer: Self-pay | Admitting: Cardiology

## 2021-08-09 DIAGNOSIS — I480 Paroxysmal atrial fibrillation: Secondary | ICD-10-CM

## 2021-08-11 NOTE — Telephone Encounter (Signed)
Xarelto '20mg'$  refill request received. Pt is 66 years old, weight-118.3kg, Crea-0.83 on 11/29/2020, last seen by Dr. Curt Bears on 08/04/2021, Diagnosis-Afib, CrCl-121.93m/min; Dose is appropriate based on dosing criteria. Will send in refill to requested pharmacy.

## 2021-08-12 ENCOUNTER — Telehealth: Payer: Self-pay

## 2021-08-12 NOTE — Telephone Encounter (Signed)
Called patient on August 11, 2021 in an attempt to schedule an AWV- unable to reach her by phone- left voice message.   Gentry Fitz, RN 08/12/2021 10:10 AM    .

## 2021-08-13 ENCOUNTER — Telehealth: Payer: Self-pay | Admitting: *Deleted

## 2021-08-13 NOTE — Telephone Encounter (Signed)
Called patient to ask about rescheduling fu on 08-21-21 due to Dr. Sondra Come being in the or, rescheduled fu for 08-28-21 @ 9 am, lvm for a return call

## 2021-08-21 ENCOUNTER — Ambulatory Visit: Payer: Self-pay | Admitting: Radiation Oncology

## 2021-08-27 NOTE — Progress Notes (Signed)
Radiation Oncology         [phone] ________________________________  Name: Helen West MRN: [id number]  Date: 08/28/2021  DOB: [birth year]  Follow-Up Visit Note  CC: [name], MD  [name], MD  No diagnosis found.  Diagnosis: Stage IB (pT1b, pN0) Invasive Endometrioid Adenocarcinoma, Grade 1  Interval Since Last Radiation: 3 years, 8 months, and 16 days  Radiation Treatment Dates: 11/14, 11/21, 11/27, 12/4, 01/12/18   Site/Dose/Rate: Vaginal cuff, 6 Gy in 5 fractions for a total dose of 30 Gy  Narrative:  The patient returns today for routine annual follow-up, she was last seen here for follow up on 08/15/20. Since her last visit, the patient was not able to follow up with Dr. Berline Lopes due to being sick.       Given so, so significant oncologic history since the patient was last seen.   ***                        Allergies:  has No Known Allergies.  Meds: Current Outpatient Medications  Medication Sig Dispense Refill   acetaminophen (TYLENOL) 650 MG CR tablet Take 1,950 mg by mouth in the morning and at bedtime.     albuterol (VENTOLIN HFA) 108 (90 Base) MCG/ACT inhaler Inhale 2 puffs into the lungs every 6 (six) hours as needed for wheezing or shortness of breath. 8 g 2   cefdinir (OMNICEF) 300 MG capsule Take 1 capsule (300 mg total) by mouth 2 (two) times daily. 20 capsule 0   chlorthalidone (HYGROTON) 25 MG tablet TAKE 1 TABLET BY MOUTH DAILY 90 tablet 0   fluticasone (FLONASE) 50 MCG/ACT nasal spray Place 2 sprays into both nostrils daily. 16 g 6   HYDROcodone bit-homatropine (HYDROMET) 5-1.5 MG/5ML syrup Take 5 mLs by mouth every 6 (six) hours as needed for cough. 120 mL 0   losartan (COZAAR) 100 MG tablet TAKE 1 TABLET BY MOUTH DAILY 90 tablet 0   meclizine (ANTIVERT) 25 MG tablet Take 1 tablet (25 mg total) by mouth 3 (three) times daily as needed for dizziness. 30 tablet 0   metoprolol succinate (TOPROL-XL) 50 MG 24 hr tablet Take 1 tablet (50 mg total)  by mouth daily. Take with or immediately following a meal. 30 tablet 5   pantoprazole (PROTONIX) 40 MG tablet Take 1 tablet (40 mg total) by mouth daily. 30 tablet 3   rivaroxaban (XARELTO) 20 MG TABS tablet TAKE ONE TABLET ONCE DAILY WITH SUPPER 90 tablet 1   rosuvastatin (CRESTOR) 10 MG tablet TAKE 1 TABLET BY MOUTH DAILY 30 tablet 0   No current facility-administered medications for this encounter.    Physical Findings: The patient is in no acute distress. Patient is alert and oriented.  vitals were not taken for this visit. .  No significant changes. Lungs are clear to auscultation bilaterally. Heart has regular rate and rhythm. No palpable cervical, supraclavicular, or axillary adenopathy. Abdomen soft, non-tender, normal bowel sounds.  On pelvic examination the external genitalia were unremarkable. A speculum exam was performed. There are no mucosal lesions noted in the vaginal vault. A Pap smear was obtained of the proximal vagina. On bimanual and rectovaginal examination there were no pelvic masses appreciated. ***    Lab Findings: Lab Results  Component Value Date   WBC 5.4 11/29/2020   HGB 13.4 11/29/2020   HCT 41.4 11/29/2020   MCV 95 11/29/2020   PLT 266 11/29/2020    Radiographic Findings: No  results found.  Impression: Stage IB (pT1b, pN0) Invasive Endometrioid Adenocarcinoma, Grade 1  The patient is recovering from the effects of radiation.  ***  Plan:  ***   *** minutes of total time was spent for this patient encounter, including preparation, face-to-face counseling with the patient and coordination of care, physical exam, and documentation of the encounter. ____________________________________  Blair Promise, PhD, MD  This document serves as a record of services personally performed by Gery Pray, MD. It was created on his behalf by Roney Mans, a trained medical scribe. The creation of this record is based on the scribe's personal observations and the  provider's statements to them. This document has been checked and approved by the attending provider.

## 2021-08-28 ENCOUNTER — Encounter: Payer: Self-pay | Admitting: Radiation Oncology

## 2021-08-28 ENCOUNTER — Ambulatory Visit
Admission: RE | Admit: 2021-08-28 | Discharge: 2021-08-28 | Disposition: A | Discharge status: Home / Self Care | Payer: PPO | Source: Ambulatory Visit | Attending: Radiation Oncology | Admitting: Radiation Oncology

## 2021-08-28 ENCOUNTER — Other Ambulatory Visit: Payer: Self-pay

## 2021-08-28 DIAGNOSIS — C541 Malignant neoplasm of endometrium: Secondary | ICD-10-CM | POA: Insufficient documentation

## 2021-08-28 DIAGNOSIS — G4733 Obstructive sleep apnea (adult) (pediatric): Secondary | ICD-10-CM | POA: Diagnosis not present

## 2021-08-28 DIAGNOSIS — Z923 Personal history of irradiation: Secondary | ICD-10-CM | POA: Insufficient documentation

## 2021-08-28 DIAGNOSIS — Z79899 Other long term (current) drug therapy: Secondary | ICD-10-CM | POA: Diagnosis not present

## 2021-08-28 DIAGNOSIS — Z8616 Personal history of COVID-19: Secondary | ICD-10-CM | POA: Insufficient documentation

## 2021-08-28 DIAGNOSIS — Z7901 Long term (current) use of anticoagulants: Secondary | ICD-10-CM | POA: Diagnosis not present

## 2021-08-28 NOTE — Progress Notes (Signed)
Helen West is here today for follow up post radiation to the pelvic.  They completed their radiation on: 01/12/18   Does the patient complain of any of the following:  Pain: No  Abdominal bloating: Yes, mostly after she eats.  Diarrhea/Constipation: No Nausea/Vomiting: No Vaginal Discharge: No Blood in Urine or Stool: No Urinary Issues (dysuria/incomplete emptying/ incontinence/ increased frequency/urgency): Urgency Does patient report using vaginal dilator 2-3 times a week and/or sexually active 2-3 weeks: Patient is sexually active.  Post radiation skin changes: No   Additional comments if applicable: Patient started on CPAP.     BP (!) 131/91 (BP Location: Right Wrist, Patient Position: Sitting, Cuff Size: Normal)   Pulse 64   Temp 98.2 F (36.8 C)   Resp 20   Ht '5\' 2"'$  (1.575 m)   Wt 261 lb 12.8 oz (118.8 kg)   SpO2 98%   BMI 47.88 kg/m

## 2021-09-11 ENCOUNTER — Other Ambulatory Visit: Payer: Self-pay | Admitting: *Deleted

## 2021-09-11 NOTE — Patient Outreach (Signed)
  Care Coordination   09/11/2021 Name: KRYSTA BLOOMFIELD MRN: [id number] DOB: November 19, 1955   Care Coordination Outreach Attempts:  An unsuccessful telephone outreach was attempted today to offer the patient information about available care coordination services as a benefit of their health plan.   Follow Up Plan:  Additional outreach attempts will be made to offer the patient care coordination information and services.   Encounter Outcome:  No Answer  Care Coordination Interventions Activated:  No   Care Coordination Interventions:  No, not indicated    Emelia Loron RN, BSN [place] [phone] [email or web address]

## 2021-09-16 ENCOUNTER — Other Ambulatory Visit: Payer: Self-pay | Admitting: Family Medicine

## 2021-09-24 ENCOUNTER — Telehealth: Payer: Self-pay

## 2021-09-24 NOTE — Progress Notes (Signed)
    Chronic Care Management Pharmacy Assistant   Name: Helen West  MRN: [id number] DOB: [birth year]   Reason for Encounter:General Adherence Call    Recent office visits:  None  Recent consult visits:  08/04/21 (Cardiology) [name] MD. Seen for Atrial Fibrillation. No med changes.  04/11/21 (Cardiology) [name] MD. Seen for OSA. No med changes.  Hospital visits:  None  Medications: Outpatient Encounter Medications as of 09/24/2021  Medication Sig   acetaminophen (TYLENOL) 650 MG CR tablet Take 1,950 mg by mouth in the morning and at bedtime.   albuterol (VENTOLIN HFA) 108 (90 Base) MCG/ACT inhaler Inhale 2 puffs into the lungs every 6 (six) hours as needed for wheezing or shortness of breath.   cefdinir (OMNICEF) 300 MG capsule Take 1 capsule (300 mg total) by mouth 2 (two) times daily.   chlorthalidone (HYGROTON) 25 MG tablet TAKE 1 TABLET BY MOUTH DAILY   fluticasone (FLONASE) 50 MCG/ACT nasal spray Place 2 sprays into both nostrils daily.   HYDROcodone bit-homatropine (HYDROMET) 5-1.5 MG/5ML syrup Take 5 mLs by mouth every 6 (six) hours as needed for cough.   losartan (COZAAR) 100 MG tablet TAKE 1 TABLET BY MOUTH DAILY   meclizine (ANTIVERT) 25 MG tablet Take 1 tablet (25 mg total) by mouth 3 (three) times daily as needed for dizziness.   metoprolol succinate (TOPROL-XL) 50 MG 24 hr tablet Take 1 tablet (50 mg total) by mouth daily. Take with or immediately following a meal.   pantoprazole (PROTONIX) 40 MG tablet Take 1 tablet (40 mg total) by mouth daily.   rivaroxaban (XARELTO) 20 MG TABS tablet TAKE ONE TABLET ONCE DAILY WITH SUPPER   rosuvastatin (CRESTOR) 10 MG tablet TAKE 1 TABLET BY MOUTH DAILY   No facility-administered encounter medications on file as of 09/24/2021.    Contacted [name] for General Review Call   Chart Review:  Have there been any documented new, changed, or discontinued medications since last visit? No ( Has there been  any documented recent hospitalizations or ED visits since last visit with Clinical Pharmacist? No  Adherence Review:  Does the Clinical Pharmacist Assistant have access to adherence rates? Yes Adherence rates for STAR metric medications (List medication(s)/day supply/ last 2 fill dates). Losartan 07/01/21-04/03/21 90ds           Rosuvastatin 08/19/21 30ds-05/29/21 90ds Adherence rates for medications indicated for disease state being reviewed (List medication(s)/day supply/ last 2 fill dates). Does the patient have >5 day gap between last estimated fill dates for any of the above medications or other medication gaps? No Reason for medication gaps.   Disease State Questions:  Able to connect with Patient? No, unable to speak with pt   [name], [place] Pharmacist Assistant  [phone]

## 2021-09-25 ENCOUNTER — Other Ambulatory Visit: Payer: Self-pay | Admitting: Family Medicine

## 2021-09-25 DIAGNOSIS — I10 Essential (primary) hypertension: Secondary | ICD-10-CM

## 2021-09-28 DIAGNOSIS — G4733 Obstructive sleep apnea (adult) (pediatric): Secondary | ICD-10-CM | POA: Diagnosis not present

## 2021-09-29 ENCOUNTER — Other Ambulatory Visit: Payer: Self-pay

## 2021-09-29 NOTE — Patient Outreach (Signed)
  Care Coordination   09/29/2021 Name: [name] MRN: [id number] DOB: 01/14/1956   Care Coordination Outreach Attempts:  A second unsuccessful outreach was attempted today to offer the patient with information about available care coordination services as a benefit of their health plan.     Follow Up Plan:  Additional outreach attempts will be made to offer the patient care coordination information and services.   Encounter Outcome:  No Answer   Care Coordination Interventions Activated:  No   Care Coordination Interventions:  No, not indicated    Tomasa Rand, RN, BSN, CEN [facility] [employer] [phone]

## 2021-10-07 ENCOUNTER — Other Ambulatory Visit: Payer: Self-pay | Admitting: Family Medicine

## 2021-10-07 DIAGNOSIS — I10 Essential (primary) hypertension: Secondary | ICD-10-CM

## 2021-10-14 DIAGNOSIS — G4733 Obstructive sleep apnea (adult) (pediatric): Secondary | ICD-10-CM | POA: Diagnosis not present

## 2021-10-29 ENCOUNTER — Telehealth: Payer: Self-pay

## 2021-10-29 DIAGNOSIS — G4733 Obstructive sleep apnea (adult) (pediatric): Secondary | ICD-10-CM | POA: Diagnosis not present

## 2021-10-29 NOTE — Patient Outreach (Signed)
  Care Coordination   Initial Visit Note   10/29/2021 Name: Helen West MRN: [id number] DOB: Feb 21, 1955  Helen West is a 66 y.o. year old female who sees [name], Kirsten, MD for primary care. I spoke with  Helen West by phone today.  What matters to the patients health and wellness today?  Placed call to patient today to explain and offer [facility] care coordination program. Patient declined.  I reminded  patient to call the office for her AWV.      SDOH assessments and interventions completed:  No     Care Coordination Interventions Activated:  No  Care Coordination Interventions:  No, not indicated   Follow up plan: No further intervention required.   Encounter Outcome:  Pt. Refused   Helen Rand, RN, BSN, CEN [facility] [employer] [phone]

## 2021-11-28 DIAGNOSIS — G4733 Obstructive sleep apnea (adult) (pediatric): Secondary | ICD-10-CM | POA: Diagnosis not present

## 2021-12-09 ENCOUNTER — Ambulatory Visit (INDEPENDENT_AMBULATORY_CARE_PROVIDER_SITE_OTHER): Payer: PPO

## 2021-12-09 VITALS — BP 120/78 | HR 62 | Ht 62.0 in | Wt 264.8 lb

## 2021-12-09 DIAGNOSIS — Z0000 Encounter for general adult medical examination without abnormal findings: Secondary | ICD-10-CM

## 2021-12-09 DIAGNOSIS — Z1231 Encounter for screening mammogram for malignant neoplasm of breast: Secondary | ICD-10-CM | POA: Diagnosis not present

## 2021-12-09 DIAGNOSIS — Z23 Encounter for immunization: Secondary | ICD-10-CM | POA: Diagnosis not present

## 2021-12-09 NOTE — Patient Instructions (Addendum)
Helen West , Thank you for taking time to come for your Medicare Wellness Visit. I appreciate your ongoing commitment to your health goals. Please review the following plan we discussed and let me know if I can assist you in the future.   Screening recommendations/referrals: Colonoscopy: Due - Call Dr Melina Copa to schedule Mammogram: Due - ordered on the mobile mammogram bus Bone Density: done 03/2020 due 03/2022 Recommended yearly ophthalmology/optometry visit for glaucoma screening and checkup Recommended yearly dental visit for hygiene and checkup  Vaccinations: Influenza vaccine: Due Pneumococcal vaccine: Completed today Tdap vaccine: Due Shingles vaccine: Due    Advanced directives: Bring a copy for your medical record    Preventive Care 68 Years and Older, Female   Preventive care refers to lifestyle choices and visits with your health care provider that can promote health and wellness.  What does preventive care include? A yearly physical exam. This is also called an annual well check. Dental exams once or twice a year. Routine eye exams. Ask your health care provider how often you should have your eyes checked. Personal lifestyle choices, including: Daily care of your teeth and gums. Regular physical activity. Eating a healthy diet. Avoiding tobacco and drug use. Limiting alcohol use. Practicing safe sex. Taking low-dose aspirin every day. Taking vitamin and mineral supplements as recommended by your health care provider.  What happens during an annual well check? The services and screenings done by your health care provider during your annual well check will depend on your age, overall health, lifestyle risk factors, and family history of disease.  Counseling Your health care provider may ask you questions about your: Alcohol use. Tobacco use. Drug use. Emotional well-being. Home and relationship well-being. Sexual activity. Eating habits. History of  falls. Memory and ability to understand (cognition). Work and work Statistician. Reproductive health.  Screening You may have the following tests or measurements: Height, weight, and BMI. Blood pressure. Lipid and cholesterol levels. These may be checked every 5 years, or more frequently if you are over 23 years old. Skin check. Lung cancer screening. You may have this screening every year starting at age 33 if you have a 30-pack-year history of smoking and currently smoke or have quit within the past 15 years. Fecal occult blood test (FOBT) of the stool. You may have this test every year starting at age 39. Flexible sigmoidoscopy or colonoscopy. You may have a sigmoidoscopy every 5 years or a colonoscopy every 10 years starting at age 39. Hepatitis C blood test. Hepatitis B blood test. Sexually transmitted disease (STD) testing. Diabetes screening. This is done by checking your blood sugar (glucose) after you have not eaten for a while (fasting). You may have this done every 1-3 years. Bone density scan. This is done to screen for osteoporosis. You may have this done starting at age 36. Mammogram. This may be done every 1-2 years. Talk to your health care provider about how often you should have regular mammograms. Talk with your health care provider about your test results, treatment options, and if necessary, the need for more tests.  Vaccines Your health care provider may recommend certain vaccines, such as: Influenza vaccine. This is recommended every year. Tetanus, diphtheria, and acellular pertussis (Tdap, Td) vaccine. You may need a Td booster every 10 years. Zoster vaccine. You may need this after age 92. Pneumococcal 13-valent conjugate (PCV13) vaccine. One dose is recommended after age 10. Pneumococcal polysaccharide (PPSV23) vaccine. One dose is recommended after age 53. Talk to your  health care provider about which screenings and vaccines you need and how often you need  them.  This information is not intended to replace advice given to you by your health care provider. Make sure you discuss any questions you have with your health care provider. Document Released: 02/15/2015 Document Revised: 10/09/2015 Document Reviewed: 11/20/2014 Elsevier Interactive Patient Education  2017 [place] Prevention in the Home  Falls can cause injuries. They can happen to people of all ages. There are many things you can do to make your home safe and to help prevent falls.  What can I do on the outside of my home? Regularly fix the edges of walkways and driveways and fix any cracks. Remove anything that might make you trip as you walk through a door, such as a raised step or threshold. Trim any bushes or trees on the path to your home. Use bright outdoor lighting. Clear any walking paths of anything that might make someone trip, such as rocks or tools. Regularly check to see if handrails are loose or broken. Make sure that both sides of any steps have handrails. Any raised decks and porches should have guardrails on the edges. Have any leaves, snow, or ice cleared regularly. Use sand or salt on walking paths during winter. Clean up any spills in your garage right away. This includes oil or grease spills.  What can I do in the bathroom? Use night lights. Install grab bars by the toilet and in the tub and shower. Do not use towel bars as grab bars. Use non-skid mats or decals in the tub or shower. If you need to sit down in the shower, use a plastic, non-slip stool. Keep the floor dry. Clean up any water that spills on the floor as soon as it happens. Remove soap buildup in the tub or shower regularly. Attach bath mats securely with double-sided non-slip rug tape. Do not have throw rugs and other things on the floor that can make you trip.  What can I do in the bedroom? Use night lights. Make sure that you have a light by your bed that is easy to  reach. Do not use any sheets or blankets that are too big for your bed. They should not hang down onto the floor. Have a firm chair that has side arms. You can use this for support while you get dressed. Do not have throw rugs and other things on the floor that can make you trip.  What can I do in the kitchen? Clean up any spills right away. Avoid walking on wet floors. Keep items that you use a lot in easy-to-reach places. If you need to reach something above you, use a strong step stool that has a grab bar. Keep electrical cords out of the way. Do not use floor polish or wax that makes floors slippery. If you must use wax, use non-skid floor wax. Do not have throw rugs and other things on the floor that can make you trip.  What can I do with my stairs? Do not leave any items on the stairs. Make sure that there are handrails on both sides of the stairs and use them. Fix handrails that are broken or loose. Make sure that handrails are as long as the stairways. Check any carpeting to make sure that it is firmly attached to the stairs. Fix any carpet that is loose or worn. Avoid having throw rugs at the top or bottom of the stairs.  If you do have throw rugs, attach them to the floor with carpet tape. Make sure that you have a light switch at the top of the stairs and the bottom of the stairs. If you do not have them, ask someone to add them for you.  What else can I do to help prevent falls? Wear shoes that: Do not have high heels. Have rubber bottoms. Are comfortable and fit you well. Are closed at the toe. Do not wear sandals. If you use a stepladder: Make sure that it is fully opened. Do not climb a closed stepladder. Make sure that both sides of the stepladder are locked into place. Ask someone to hold it for you, if possible. Clearly mark and make sure that you can see: Any grab bars or handrails. First and last steps. Where the edge of each step is. Use tools that help you move  around (mobility aids) if they are needed. These include: Canes. Walkers. Scooters. Crutches. Turn on the lights when you go into a dark area. Replace any light bulbs as soon as they burn out. Set up your furniture so you have a clear path. Avoid moving your furniture around. If any of your floors are uneven, fix them. If there are any pets around you, be aware of where they are. Review your medicines with your doctor. Some medicines can make you feel dizzy. This can increase your chance of falling. Ask your doctor what other things that you can do to help prevent falls.  This information is not intended to replace advice given to you by your health care provider. Make sure you discuss any questions you have with your health care provider. Document Released: 11/15/2008 Document Revised: 06/27/2015 Document Reviewed: 02/23/2014 Elsevier Interactive Patient Education  2017 [employer].

## 2021-12-09 NOTE — Progress Notes (Signed)
Subjective:   Helen West is a 66 y.o. female who presents for Medicare Annual (Subsequent) preventive examination.  This wellness visit is conducted by a nurse.  The patient's medications were reviewed and reconciled since the patient's last visit.  History details were provided by the patient.  The history appears to be reliable.    Medical History: Patient history and Family history was reviewed  Medications, Allergies, and preventative health maintenance was reviewed and updated.   Cardiac Risk Factors include: advanced age (>32mn, >>41women);obesity (BMI >30kg/m2)     Objective:    Today's Vitals   12/09/21 1019  BP: 120/78  Pulse: 62  SpO2: 97%  Weight: 264 lb 12.8 oz (120.1 kg)  Height: '5\' 2"'$  (1.575 m)  PainSc: 0-No pain   Body mass index is 48.43 kg/m.     08/28/2021    8:55 AM 10/10/2020   11:06 PM 08/15/2020    9:10 AM 06/14/2020    8:41 PM 05/15/2020    7:15 AM 12/25/2019    2:13 PM 11/16/2019    9:09 AM  Advanced Directives  Does Patient Have a Medical Advance Directive? Yes Yes Yes Yes Yes Yes No  Type of Advance Directive  Living will;Healthcare Power of Attorney  Living will Healthcare Power of [place]   Does patient want to make changes to medical advance directive? No - Patient declined No - Patient declined No - Patient declined No - Patient declined No - Patient declined    Copy of HConcordin Chart?  No - copy requested   No - copy requested    Would patient like information on creating a medical advance directive?       No - Patient declined    Current Medications (verified) Outpatient Encounter Medications as of 12/09/2021  Medication Sig   acetaminophen (TYLENOL) 650 MG CR tablet Take 1,950 mg by mouth in the morning and at bedtime.   albuterol (VENTOLIN HFA) 108 (90 Base) MCG/ACT inhaler Inhale 2 puffs into the lungs every 6 (six) hours as needed for wheezing or shortness of breath.    chlorthalidone (HYGROTON) 25 MG tablet TAKE 1 TABLET BY MOUTH DAILY   fluticasone (FLONASE) 50 MCG/ACT nasal spray Place 2 sprays into both nostrils daily.   losartan (COZAAR) 100 MG tablet TAKE 1 TABLET BY MOUTH DAILY   metoprolol succinate (TOPROL-XL) 50 MG 24 hr tablet Take 1 tablet (50 mg total) by mouth daily. Take with or immediately following a meal.   pantoprazole (PROTONIX) 40 MG tablet Take 1 tablet (40 mg total) by mouth daily.   rivaroxaban (XARELTO) 20 MG TABS tablet TAKE ONE TABLET ONCE DAILY WITH SUPPER   rosuvastatin (CRESTOR) 10 MG tablet TAKE 1 TABLET BY MOUTH DAILY   cefdinir (OMNICEF) 300 MG capsule Take 1 capsule (300 mg total) by mouth 2 (two) times daily. (Patient not taking: Reported on 12/09/2021)   HYDROcodone bit-homatropine (HYDROMET) 5-1.5 MG/5ML syrup Take 5 mLs by mouth every 6 (six) hours as needed for cough. (Patient not taking: Reported on 12/09/2021)   meclizine (ANTIVERT) 25 MG tablet Take 1 tablet (25 mg total) by mouth 3 (three) times daily as needed for dizziness. (Patient not taking: Reported on 12/09/2021)   No facility-administered encounter medications on file as of 12/09/2021.    Allergies (verified) Patient has no known allergies.   History: Past Medical History:  Diagnosis Date   A-fib ([place]    Anemia  Arthritis    Cancer ([place])    Complication of anesthesia    Loss of vocal/voice x 2 months   Dysrhythmia    Headache    High cholesterol    History of radiation therapy 01/12/2018   11/14, 11/21, 11/27, 12/4, 01/12/18:  Vaginal cuff, 6 Gy in 5 fractions for a total dose of 30 Gy  Dr Gery Pray   Obesity    Post-menopausal bleeding    Past Surgical History:  Procedure Laterality Date   ATRIAL FIBRILLATION ABLATION N/A 05/15/2020   Procedure: ATRIAL FIBRILLATION ABLATION;  Surgeon: [name], MD;  Location: [facility];  Service: Cardiovascular;  Laterality: N/A;   ROBOTIC ASSISTED TOTAL HYSTERECTOMY WITH BILATERAL  SALPINGO OOPHERECTOMY Bilateral 11/02/2017   Procedure: XI ROBOTIC ASSISTED TOTAL HYSTERECTOMY WITH BILATERAL SALPINGO OOPHORECTOMY;  Surgeon: [name], MD;  Location: [facility];  Service: Gynecology;  Laterality: Bilateral;   SENTINEL NODE BIOPSY N/A 11/02/2017   Procedure: SENTINEL NODE BIOPSY;  Surgeon: [name], MD;  Location: [facility];  Service: Gynecology;  Laterality: N/A;   TONSILLECTOMY     TOTAL HIP ARTHROPLASTY     TUBAL LIGATION     Family History  Problem Relation Age of Onset   Aneurysm Mother    Emphysema Father    Cancer Paternal Uncle    Social History   Socioeconomic History   Marital status: Married    Spouse name: Geoffery Spruce   Number of children: 2   Years of education: Not on file   Highest education level: Not on file  Occupational History   Not on file  Tobacco Use   Smoking status: Never   Smokeless tobacco: Never  Vaping Use   Vaping Use: Never used  Substance and Sexual Activity   Alcohol use: Yes    Comment: occasional social   Drug use: Never   Sexual activity: Not on file  Other Topics Concern   Not on file  Social History Narrative   2 children from previous marriage, 2 step children and many grandchildren    Social Determinants of Health   Financial Resource Strain: Low Risk  (12/09/2021)   Overall Financial Resource Strain (CARDIA)    Difficulty of Paying Living Expenses: Not hard at all  Food Insecurity: No Food Insecurity (12/09/2021)   Hunger Vital Sign    Worried About Running Out of Food in the Last Year: Never true    [place] in the Last Year: Never true  Transportation Needs: No Transportation Needs (12/09/2021)   PRAPARE - Hydrologist (Medical): No    Lack of Transportation (Non-Medical): No  Physical Activity: Inactive (12/09/2021)   Exercise Vital Sign    Days of Exercise per Week: 0 days    Minutes of Exercise per Session: 0 min  Stress: No Stress Concern Present (12/09/2021)   [place]    Feeling of Stress : Not at all  Social Connections: Unknown (12/09/2021)   Social Connection and Isolation Panel [NHANES]    Frequency of Communication with Friends and Family: More than three times a week    Frequency of Social Gatherings with Friends and Family: Twice a week    Attends Religious Services: Patient refused    Marine scientist or Organizations: No    Attends Music therapist: More than 4 times per year    Marital Status: Married    Tobacco  Counseling Counseling given: Not Answered   Clinical Intake:  Pre-visit preparation completed: Yes Pain : No/denies pain Pain Score: 0-No pain   BMI - recorded: 48.43 Nutritional Status: BMI > 30  Obese Diabetes: No How often do you need to have someone help you when you read instructions, pamphlets, or other written materials from your doctor or pharmacy?: 1 - Never Interpreter Needed?: No    Activities of Daily Living    12/09/2021   10:34 AM  In your present state of health, do you have any difficulty performing the following activities:  Hearing? 0  Vision? 0  Difficulty concentrating or making decisions? 0  Walking or climbing stairs? 0  Dressing or bathing? 0  Doing errands, shopping? 0  Preparing Food and eating ? N  Using the Toilet? N  In the past six months, have you accidently leaked urine? N  Do you have problems with loss of bowel control? N  Managing your Medications? N  Managing your Finances? N  Housekeeping or managing your Housekeeping? N    Patient Care Team: [name], MD as PCP - General (Family Medicine) [name], MD as PCP - Electrophysiology (Cardiology) Sueanne Margarita, MD as PCP - Sleep Medicine (Cardiology) Burnice Logan, [facility] (Inactive) as Pharmacist (Pharmacist) Burnice Logan, [facility] (Inactive) as Pharmacist (Pharmacist) Gery Pray, MD as Consulting Physician (Radiation  Oncology)     Assessment:   This is a routine wellness examination for [place].  Hearing/Vision screen No results found.  Dietary issues and exercise activities discussed: Current Exercise Habits: The patient does not participate in regular exercise at present, Exercise limited by: None identified   Depression Screen    12/09/2021   10:26 AM 11/29/2020    7:54 AM 05/31/2020    7:51 AM 02/13/2020    9:10 AM 12/25/2019    2:09 PM 12/16/2017    8:12 AM 11/15/2017    8:38 AM  PHQ 2/9 Scores  PHQ - 2 Score 0 0 0 0 0 0 0    Fall Risk    12/09/2021   10:33 AM 11/29/2020    7:54 AM 05/31/2020    8:28 AM 05/31/2020    7:51 AM 02/13/2020    9:10 AM  Fall Risk   Falls in the past year? 0 0  0 0  Number falls in past yr: 0 0  0 0  Injury with Fall? 0 0  0 0  Risk for fall due to : No Fall Risks  History of fall(s)    Follow up Falls evaluation completed;Education provided;Falls prevention discussed Falls evaluation completed Falls evaluation completed      FALL RISK PREVENTION PERTAINING TO THE HOME:  Any stairs in or around the home? Yes  If so, are there any without handrails? No  Home free of loose throw rugs in walkways, pet beds, electrical cords, etc? Yes  Adequate lighting in your home to reduce risk of falls? Yes   ASSISTIVE DEVICES UTILIZED TO PREVENT FALLS:  Life alert? No  Use of a cane, walker or w/c? No   Gait steady and fast without use of assistive device  Cognitive Function:        12/09/2021   10:34 AM  6CIT Screen  What Year? 0 points  What month? 0 points  What time? 0 points  Count back from 20 0 points  Months in reverse 0 points  Repeat phrase 0 points  Total Score 0 points    Immunizations  Immunization History  Administered Date(s) Administered   Fluad Quad(high Dose 65+) 11/29/2020   Moderna Sars-Covid-2 Vaccination 02/23/2019, 03/22/2019, 10/10/2019   PNEUMOCOCCAL CONJUGATE-20 12/09/2021   Pneumococcal Polysaccharide-23 02/13/2020     TDAP status: Due, Education has been provided regarding the importance of this vaccine. Advised may receive this vaccine at local pharmacy or Health Dept. Aware to provide a copy of the vaccination record if obtained from local pharmacy or Health Dept. Verbalized acceptance and understanding.  Flu Vaccine status: Declined, Education has been provided regarding the importance of this vaccine but patient still declined. Advised may receive this vaccine at local pharmacy or Health Dept. Aware to provide a copy of the vaccination record if obtained from local pharmacy or Health Dept. Verbalized acceptance and understanding.  Pneumococcal vaccine status: Completed during today's visit.  Covid-19 vaccine status: Declined, Education has been provided regarding the importance of this vaccine but patient still declined. Advised may receive this vaccine at local pharmacy or Health Dept.or vaccine clinic. Aware to provide a copy of the vaccination record if obtained from local pharmacy or Health Dept. Verbalized acceptance and understanding.  Qualifies for Shingles Vaccine? Yes   Zostavax completed No   Shingrix Completed?: No.    Education has been provided regarding the importance of this vaccine. Patient has been advised to call insurance company to determine out of pocket expense if they have not yet received this vaccine. Advised may also receive vaccine at local pharmacy or Health Dept. Verbalized acceptance and understanding.  Screening Tests Health Maintenance  Topic Date Due   Hepatitis C Screening  Never done   COVID-19 Vaccine (4 - Moderna risk series) 12/05/2019   Medicare Annual Wellness (AWV)  12/24/2020   COLONOSCOPY (Pts 45-14yr Insurance coverage will need to be confirmed)  09/04/2021   Zoster Vaccines- Shingrix (1 of 2) 03/11/2022 (Originally 06/10/1974)   INFLUENZA VACCINE  05/03/2022 (Originally 09/02/2021)   TETANUS/TDAP  12/10/2022 (Originally 06/10/1974)   MAMMOGRAM  03/04/2022    Pneumonia Vaccine 66 Years old  Completed   DEXA SCAN  Completed   HPV VACCINES  Aged Out    Health Maintenance  Health Maintenance Due  Topic Date Due   Hepatitis C Screening  Never done   COVID-19 Vaccine (4 - Moderna risk series) 12/05/2019   Medicare Annual Wellness (AWV)  12/24/2020   COLONOSCOPY (Pts 45-419yrInsurance coverage will need to be confirmed)  09/04/2021    Colorectal cancer screening: Type of screening: Colonoscopy. Completed 09/2016. Repeat every 5 years  Mammogram status: Ordered to be done on the mobile mammogram bus  Bone Density status: Completed 03/13/20. Results reflect: Bone density results: OSTEOPENIA. Repeat every 2 years.  Lung Cancer Screening: (Low Dose CT Chest recommended if Age 68-80ears, 30 pack-year currently smoking OR have quit w/in 15years.) does not qualify.    Additional Screening:  Vision Screening: Recommended annual ophthalmology exams for early detection of glaucoma and other disorders of the eye. Is the patient up to date with their annual eye exam?  Yes   Dental Screening: Recommended annual dental exams for proper oral hygiene  Community Resource Referral / Chronic Care Management: CRR required this visit?  No   CCM required this visit?  No      Plan:     I have personally reviewed and noted the following in the patient's chart:   Medical and social history Use of alcohol, tobacco or illicit drugs  Current medications and supplements including opioid prescriptions. Patient is not currently  taking opioid prescriptions. Functional ability and status Nutritional status Physical activity Advanced directives List of other physicians Hospitalizations, surgeries, and ER visits in previous 12 months Vitals Screenings to include cognitive, depression, and falls Referrals and appointments  In addition, I have reviewed and discussed with patient certain preventive protocols, quality metrics, and best practice  recommendations. A written personalized care plan for preventive services as well as general preventive health recommendations were provided to patient.     Erie Noe, LPN   [id number]/04/[id number]

## 2021-12-10 ENCOUNTER — Other Ambulatory Visit: Payer: Self-pay | Admitting: Cardiology

## 2021-12-12 ENCOUNTER — Other Ambulatory Visit: Payer: PPO

## 2021-12-12 DIAGNOSIS — I10 Essential (primary) hypertension: Secondary | ICD-10-CM

## 2021-12-12 DIAGNOSIS — E782 Mixed hyperlipidemia: Secondary | ICD-10-CM | POA: Diagnosis not present

## 2021-12-12 DIAGNOSIS — R7309 Other abnormal glucose: Secondary | ICD-10-CM | POA: Diagnosis not present

## 2021-12-12 DIAGNOSIS — Z1159 Encounter for screening for other viral diseases: Secondary | ICD-10-CM | POA: Diagnosis not present

## 2021-12-13 LAB — COMPREHENSIVE METABOLIC PANEL
ALT: 10 IU/L (ref 0–32)
AST: 14 IU/L (ref 0–40)
Albumin/Globulin Ratio: 1.9 (ref 1.2–2.2)
Albumin: 4.4 g/dL (ref 3.9–4.9)
Alkaline Phosphatase: 94 IU/L (ref 44–121)
BUN/Creatinine Ratio: 20 (ref 12–28)
BUN: 17 mg/dL (ref 8–27)
Bilirubin Total: 0.4 mg/dL (ref 0.0–1.2)
CO2: 28 mmol/L (ref 20–29)
Calcium: 9.7 mg/dL (ref 8.7–10.3)
Chloride: 94 mmol/L — ABNORMAL LOW (ref 96–106)
Creatinine, Ser: 0.83 mg/dL (ref 0.57–1.00)
Globulin, Total: 2.3 g/dL (ref 1.5–4.5)
Glucose: 108 mg/dL — ABNORMAL HIGH (ref 70–99)
Potassium: 4.5 mmol/L (ref 3.5–5.2)
Sodium: 140 mmol/L (ref 134–144)
Total Protein: 6.7 g/dL (ref 6.0–8.5)
eGFR: 77 mL/min/{1.73_m2} (ref >59)

## 2021-12-13 LAB — HEMOGLOBIN A1C
Est. average glucose Bld gHb Est-mCnc: 126 mg/dL
Hgb A1c MFr Bld: 6 % — ABNORMAL HIGH (ref 4.8–5.6)

## 2021-12-13 LAB — LIPID PANEL
Chol/HDL Ratio: 3 ratio (ref 0.0–4.4)
Cholesterol, Total: 161 mg/dL (ref 100–199)
HDL: 53 mg/dL (ref >39)
LDL Chol Calc (NIH): 79 mg/dL (ref 0–99)
Triglycerides: 171 mg/dL — ABNORMAL HIGH (ref 0–149)
VLDL Cholesterol Cal: 29 mg/dL (ref 5–40)

## 2021-12-13 LAB — CARDIOVASCULAR RISK ASSESSMENT

## 2021-12-13 LAB — CBC
Hematocrit: 42.7 % (ref 34.0–46.6)
Hemoglobin: 13.7 g/dL (ref 11.1–15.9)
MCH: 30.2 pg (ref 26.6–33.0)
MCHC: 32.1 g/dL (ref 31.5–35.7)
MCV: 94 fL (ref 79–97)
Platelets: 279 10*3/uL (ref 150–450)
RBC: 4.53 x10E6/uL (ref 3.77–5.28)
RDW: 12.4 % (ref 11.7–15.4)
WBC: 5 10*3/uL (ref 3.4–10.8)

## 2021-12-13 NOTE — Progress Notes (Signed)
Blood count normal.  Liver function normal.  Kidney function normal.  Cholesterol: triglycerides little high.  HBA1C:6. Improved.  Appt coming up. Dr. Tobie Poet

## 2021-12-15 ENCOUNTER — Encounter: Payer: Self-pay | Admitting: Family Medicine

## 2021-12-15 ENCOUNTER — Ambulatory Visit (INDEPENDENT_AMBULATORY_CARE_PROVIDER_SITE_OTHER): Payer: PPO | Admitting: Family Medicine

## 2021-12-15 VITALS — BP 120/76 | HR 74 | Temp 98.7°F | Resp 14 | Ht 62.0 in | Wt 262.0 lb

## 2021-12-15 DIAGNOSIS — D6869 Other thrombophilia: Secondary | ICD-10-CM | POA: Diagnosis not present

## 2021-12-15 DIAGNOSIS — K219 Gastro-esophageal reflux disease without esophagitis: Secondary | ICD-10-CM | POA: Diagnosis not present

## 2021-12-15 DIAGNOSIS — I10 Essential (primary) hypertension: Secondary | ICD-10-CM

## 2021-12-15 DIAGNOSIS — Z1159 Encounter for screening for other viral diseases: Secondary | ICD-10-CM | POA: Diagnosis not present

## 2021-12-15 DIAGNOSIS — E782 Mixed hyperlipidemia: Secondary | ICD-10-CM

## 2021-12-15 DIAGNOSIS — Z1211 Encounter for screening for malignant neoplasm of colon: Secondary | ICD-10-CM | POA: Insufficient documentation

## 2021-12-15 DIAGNOSIS — R7309 Other abnormal glucose: Secondary | ICD-10-CM

## 2021-12-15 DIAGNOSIS — I480 Paroxysmal atrial fibrillation: Secondary | ICD-10-CM

## 2021-12-15 DIAGNOSIS — C548 Malignant neoplasm of overlapping sites of corpus uteri: Secondary | ICD-10-CM

## 2021-12-15 MED ORDER — LOSARTAN POTASSIUM 100 MG PO TABS: 100.0000 mg | ORAL_TABLET | Freq: Every day | ORAL | 1 refills | Status: DC

## 2021-12-15 MED ORDER — ROSUVASTATIN CALCIUM 10 MG PO TABS: 10.0000 mg | ORAL_TABLET | Freq: Every day | ORAL | 1 refills | Status: DC

## 2021-12-15 MED ORDER — METOPROLOL SUCCINATE ER 50 MG PO TB24: 50.0000 mg | ORAL_TABLET | Freq: Every day | ORAL | 1 refills | Status: DC

## 2021-12-15 MED ORDER — CHLORTHALIDONE 25 MG PO TABS: 25.0000 mg | ORAL_TABLET | Freq: Every day | ORAL | 1 refills | Status: DC

## 2021-12-15 NOTE — Progress Notes (Signed)
Subjective:  Patient ID: Helen West, female    DOB: [birth year]  Age: 66 y.o. MRN: [id number]  Chief Complaint  Patient presents with   Hypertension   Hyperlipidemia    HPI Atrial fibrillation on xarelto 20 mg daily and metoprolol succinate 50 mg once daily.  OSA: She is on CPAP 20 pressure.  Hyperlipidemia: Current medications: Crestor 10 mg daily.  Hypertension: Current medications: Chlorthalidone 25 mg daily, Losartan 100 mg daily, Metoprolol succinate 50 mg daily  Diet: Healthy. Fruits and vegetable. Sausage gravy. Eats steak, Eats fish.  Exercise: bike 15 minutes every other day.  GERD-Protonix 40 mg twice daily   Current Outpatient Medications on File Prior to Visit  Medication Sig Dispense Refill   acetaminophen (TYLENOL) 650 MG CR tablet Take 1,950 mg by mouth in the morning and at bedtime.     albuterol (VENTOLIN HFA) 108 (90 Base) MCG/ACT inhaler Inhale 2 puffs into the lungs every 6 (six) hours as needed for wheezing or shortness of breath. 8 g 2   fluticasone (FLONASE) 50 MCG/ACT nasal spray Place 2 sprays into both nostrils daily. 16 g 6   meclizine (ANTIVERT) 25 MG tablet Take 1 tablet (25 mg total) by mouth 3 (three) times daily as needed for dizziness. 30 tablet 0   pantoprazole (PROTONIX) 40 MG tablet Take 1 tablet (40 mg total) by mouth daily. 30 tablet 3   Respiratory Therapy Supplies (CARETOUCH CPAP & BIPAP HOSE) MISC by Does not apply route.     rivaroxaban (XARELTO) 20 MG TABS tablet TAKE ONE TABLET ONCE DAILY WITH SUPPER 90 tablet 1   No current facility-administered medications on file prior to visit.   Past Medical History:  Diagnosis Date   A-fib ([place])    Anemia    Arthritis    Cancer ([place])    Complex endometrial hyperplasia with atypia 93/71/6967   Complication of anesthesia    Loss of vocal/voice x 2 months   Dysrhythmia    Endometrial cancer ([place]) 11/02/2017   Headache    High cholesterol    History of radiation therapy 01/12/2018    11/14, 11/21, 11/27, 12/4, 01/12/18:  Vaginal cuff, 6 Gy in 5 fractions for a total dose of 30 Gy  Dr Gery Pray   Malignant neoplasm of overlapping sites of body of uterus ([place]) 12/29/2017   xrt 11/19   Obesity    Post-menopausal bleeding    Past Surgical History:  Procedure Laterality Date   ATRIAL FIBRILLATION ABLATION N/A 05/15/2020   Procedure: ATRIAL FIBRILLATION ABLATION;  Surgeon: [name], MD;  Location: [facility];  Service: Cardiovascular;  Laterality: N/A;   ROBOTIC ASSISTED TOTAL HYSTERECTOMY WITH BILATERAL SALPINGO OOPHERECTOMY Bilateral 11/02/2017   Procedure: XI ROBOTIC ASSISTED TOTAL HYSTERECTOMY WITH BILATERAL SALPINGO OOPHORECTOMY;  Surgeon: [name], MD;  Location: [facility];  Service: Gynecology;  Laterality: Bilateral;   SENTINEL NODE BIOPSY N/A 11/02/2017   Procedure: SENTINEL NODE BIOPSY;  Surgeon: [name], MD;  Location: [facility];  Service: Gynecology;  Laterality: N/A;   TONSILLECTOMY     TOTAL HIP ARTHROPLASTY     TUBAL LIGATION      Family History  Problem Relation Age of Onset   Aneurysm Mother    Emphysema Father    Cancer Paternal Uncle    Social History   Socioeconomic History   Marital status: Married    Spouse name: Geoffery Spruce   Number of children: 2   Years of education: Not on file  Highest education level: Not on file  Occupational History   Not on file  Tobacco Use   Smoking status: Never   Smokeless tobacco: Never  Vaping Use   Vaping Use: Never used  Substance and Sexual Activity   Alcohol use: Yes    Comment: occasional social   Drug use: Never   Sexual activity: Not on file  Other Topics Concern   Not on file  Social History Narrative   2 children from previous marriage, 2 step children and many grandchildren    Social Determinants of Health   Financial Resource Strain: Low Risk  (12/09/2021)   Overall Financial Resource Strain (CARDIA)    Difficulty of Paying Living Expenses: Not hard at all  Food  Insecurity: No Food Insecurity (12/09/2021)   Hunger Vital Sign    Worried About Running Out of Food in the Last Year: Never true    Ran Out of Food in the Last Year: Never true  Transportation Needs: No Transportation Needs (12/09/2021)   PRAPARE - Hydrologist (Medical): No    Lack of Transportation (Non-Medical): No  Physical Activity: Inactive (12/09/2021)   Exercise Vital Sign    Days of Exercise per Week: 0 days    Minutes of Exercise per Session: 0 min  Stress: No Stress Concern Present (12/09/2021)   Ball    Feeling of Stress : Not at all  Social Connections: Unknown (12/09/2021)   Social Connection and Isolation Panel [NHANES]    Frequency of Communication with Friends and Family: More than three times a week    Frequency of Social Gatherings with Friends and Family: Twice a week    Attends Religious Services: Patient refused    Marine scientist or Organizations: No    Attends Music therapist: More than 4 times per year    Marital Status: Married    Review of Systems  Constitutional:  Negative for chills, fatigue and fever.  HENT:  Negative for congestion, ear pain and sore throat.        Grounding teeth   Respiratory:  Negative for cough and shortness of breath.   Cardiovascular:  Negative for chest pain and palpitations.  Gastrointestinal:  Negative for abdominal pain, constipation, diarrhea, nausea and vomiting.  Endocrine: Negative for polydipsia, polyphagia and polyuria.  Genitourinary:  Negative for difficulty urinating and dysuria.  Musculoskeletal:  Negative for arthralgias, back pain and myalgias.  Skin:  Negative for rash.  Neurological:  Positive for headaches.  Psychiatric/Behavioral:  Negative for dysphoric mood. The patient is not nervous/anxious.      Objective:  BP 120/76   Pulse 74   Temp 98.7 F (37.1 C)   Resp 14   Ht '5\' 2"'$   (1.575 m)   Wt 262 lb (118.8 kg)   BMI 47.92 kg/m      12/15/2021    1:45 PM 12/09/2021   10:19 AM 08/28/2021    8:50 AM  BP/Weight  Systolic BP 500 938 182  Diastolic BP 76 78 91  Wt. (Lbs) 262 264.8 261.8  BMI 47.92 kg/m2 48.43 kg/m2 47.88 kg/m2    Physical Exam Vitals reviewed.  Constitutional:      Appearance: Normal appearance. She is obese.  Cardiovascular:     Rate and Rhythm: Normal rate. Rhythm irregular.     Heart sounds: Normal heart sounds.  Pulmonary:     Effort: Pulmonary effort is normal.  Breath sounds: Normal breath sounds.  Abdominal:     General: Bowel sounds are normal.     Palpations: Abdomen is soft.     Tenderness: There is no abdominal tenderness.  Musculoskeletal:     Right lower leg: No edema.     Left lower leg: No edema.  Neurological:     Mental Status: She is alert and oriented to person, place, and time.  Psychiatric:        Mood and Affect: Mood normal.        Behavior: Behavior normal.     Diabetic Foot Exam - Simple   No data filed      Lab Results  Component Value Date   WBC 5.0 12/12/2021   HGB 13.7 12/12/2021   HCT 42.7 12/12/2021   PLT 279 12/12/2021   GLUCOSE 108 (H) 12/12/2021   CHOL 161 12/12/2021   TRIG 171 (H) 12/12/2021   HDL 53 12/12/2021   LDLCALC 79 12/12/2021   ALT 10 12/12/2021   AST 14 12/12/2021   NA 140 12/12/2021   K 4.5 12/12/2021   CL 94 (L) 12/12/2021   CREATININE 0.83 12/12/2021   BUN 17 12/12/2021   CO2 28 12/12/2021   TSH 2.440 02/13/2020   HGBA1C 6.0 (H) 12/12/2021      Assessment & Plan:   Problem List Items Addressed This Visit       Cardiovascular and Mediastinum   PAF (paroxysmal atrial fibrillation) ([place])    Management per specialist. The current medical regimen is effective;  continue present plan and medications.  xarelto 20 mg daily and metoprolol succinate 50 mg once daily.       Relevant Medications   metoprolol succinate (TOPROL-XL) 50 MG 24 hr tablet    chlorthalidone (HYGROTON) 25 MG tablet   rosuvastatin (CRESTOR) 10 MG tablet   losartan (COZAAR) 100 MG tablet   Primary hypertension - Primary    Well controlled.  No changes to medicines. Chlorthalidone 25 mg daily, Losartan 100 mg daily, Metoprolol succinate 50 mg daily Continue to work on eating a healthy diet and exercise.  Labs reviewed      Relevant Medications   metoprolol succinate (TOPROL-XL) 50 MG 24 hr tablet   chlorthalidone (HYGROTON) 25 MG tablet   rosuvastatin (CRESTOR) 10 MG tablet   losartan (COZAAR) 100 MG tablet     Digestive   GERD (gastroesophageal reflux disease)    The current medical regimen is effective;  continue present plan and medications.  Taking Protonix 40 mg twice a day.        Genitourinary   RESOLVED: Malignant neoplasm of overlapping sites of body of uterus ([place])    On remission. Management per specialist.        Hematopoietic and Hemostatic   Acquired thrombophilia ([place])    Secondary to xarelto which is required for atrial fibrillation        Other   Elevated glucose    Hemoglobin A1c, 3 month avg of blood sugars, is in prediabetic range.  In order to prevent progression to diabetes, recommend low carb diet and regular exercise       Mixed hyperlipidemia    Fair control. No changes to medicines. Crestor 10 mg daily. Continue to work on eating a healthy diet and exercise.  Labs reviewed      Relevant Medications   metoprolol succinate (TOPROL-XL) 50 MG 24 hr tablet   chlorthalidone (HYGROTON) 25 MG tablet   rosuvastatin (CRESTOR) 10 MG tablet  losartan (COZAAR) 100 MG tablet   Colon cancer screening    Refer to [facility]. Due for colonoscopy.        Relevant Orders   Ambulatory referral to Gastroenterology   Encounter for hepatitis C screening test for low risk patient    Hep c screen     .  Meds ordered this encounter  Medications   metoprolol succinate (TOPROL-XL) 50 MG 24 hr tablet    Sig: Take 1 tablet (50  mg total) by mouth daily. TAKE WITH OR IMMEDIATELY FOLLOWING A MEAL.    Dispense:  90 tablet    Refill:  1   chlorthalidone (HYGROTON) 25 MG tablet    Sig: Take 1 tablet (25 mg total) by mouth daily.    Dispense:  90 tablet    Refill:  1   rosuvastatin (CRESTOR) 10 MG tablet    Sig: Take 1 tablet (10 mg total) by mouth daily.    Dispense:  90 tablet    Refill:  1   losartan (COZAAR) 100 MG tablet    Sig: Take 1 tablet (100 mg total) by mouth daily.    Dispense:  90 tablet    Refill:  1    Orders Placed This Encounter  Procedures   Ambulatory referral to Gastroenterology    Total time spent on today's visit was greater than 30 minutes, including both face-to-face time and nonface-to-face time personally spent on review of chart (labs and imaging), discussing labs and goals, discussing further work-up, treatment options, referrals to specialist if needed, reviewing outside records of pertinent, answering patient's questions, and coordinating care.  Follow-up: Return in about 6 months (around 06/15/2022) for chronic follow up, Nurse visit.  An After Visit Summary was printed and given to the patient.  [name], MD [facility] [phone]

## 2021-12-15 NOTE — Assessment & Plan Note (Signed)
Hep c screen

## 2021-12-15 NOTE — Assessment & Plan Note (Signed)
On remission. Management per specialist.

## 2021-12-15 NOTE — Assessment & Plan Note (Addendum)
The current medical regimen is effective;  continue present plan and medications.  Taking Protonix 40 mg twice a day.

## 2021-12-15 NOTE — Assessment & Plan Note (Signed)
Hemoglobin A1c, 3 month avg of blood sugars, is in prediabetic range.  In order to prevent progression to diabetes, recommend low carb diet and regular exercise

## 2021-12-15 NOTE — Assessment & Plan Note (Signed)
Refer to [facility]. Due for colonoscopy.

## 2021-12-15 NOTE — Assessment & Plan Note (Signed)
Management per specialist. The current medical regimen is effective;  continue present plan and medications.  xarelto 20 mg daily and metoprolol succinate 50 mg once daily.

## 2021-12-15 NOTE — Assessment & Plan Note (Addendum)
Fair control. No changes to medicines. Crestor 10 mg daily. Continue to work on eating a healthy diet and exercise.  Labs reviewed

## 2021-12-15 NOTE — Assessment & Plan Note (Signed)
Secondary to xarelto which is required for atrial fibrillation

## 2021-12-15 NOTE — Assessment & Plan Note (Addendum)
Well controlled.  No changes to medicines. Chlorthalidone 25 mg daily, Losartan 100 mg daily, Metoprolol succinate 50 mg daily Continue to work on eating a healthy diet and exercise.  Labs reviewed

## 2021-12-18 LAB — SPECIMEN STATUS REPORT

## 2021-12-18 LAB — HEPATITIS C ANTIBODY: Hep C Virus Ab: NONREACTIVE

## 2021-12-29 ENCOUNTER — Other Ambulatory Visit: Payer: Self-pay | Admitting: Family Medicine

## 2021-12-29 DIAGNOSIS — G4733 Obstructive sleep apnea (adult) (pediatric): Secondary | ICD-10-CM | POA: Diagnosis not present

## 2021-12-30 ENCOUNTER — Other Ambulatory Visit: Payer: Self-pay

## 2021-12-30 MED ORDER — PANTOPRAZOLE SODIUM 40 MG PO TBEC: 40.0000 mg | DELAYED_RELEASE_TABLET | Freq: Every day | ORAL | 1 refills | Status: DC

## 2021-12-31 ENCOUNTER — Inpatient Hospital Stay: Admission: RE | Admit: 2021-12-31 | Payer: PPO | Source: Ambulatory Visit

## 2021-12-31 ENCOUNTER — Telehealth: Payer: Self-pay | Admitting: Family Medicine

## 2021-12-31 NOTE — Telephone Encounter (Signed)
LEFT VOICEMAIL FOR PT TO GET MAMMOGRAM APPT [facility]

## 2022-01-14 DIAGNOSIS — G4733 Obstructive sleep apnea (adult) (pediatric): Secondary | ICD-10-CM | POA: Diagnosis not present

## 2022-01-28 ENCOUNTER — Other Ambulatory Visit: Payer: Self-pay

## 2022-01-28 DIAGNOSIS — I480 Paroxysmal atrial fibrillation: Secondary | ICD-10-CM

## 2022-01-28 MED ORDER — RIVAROXABAN 20 MG PO TABS: ORAL_TABLET | ORAL | 1 refills | Status: DC

## 2022-01-28 NOTE — Telephone Encounter (Signed)
Prescription refill request for Xarelto received.  Indication: Afib  Last office visit: 08/04/21 Curt Bears)  Weight: 118.8kg Age: 66 Scr: 0.83 (12/12/21)  CrCl: 121.64m/min  Appropriate dose and refill sent to requested pharmacy

## 2022-03-12 ENCOUNTER — Encounter (HOSPITAL_COMMUNITY): Payer: Self-pay | Admitting: *Deleted

## 2022-04-15 DIAGNOSIS — G4733 Obstructive sleep apnea (adult) (pediatric): Secondary | ICD-10-CM | POA: Diagnosis not present

## 2022-05-26 ENCOUNTER — Encounter: Payer: Self-pay | Admitting: Family Medicine

## 2022-05-26 ENCOUNTER — Ambulatory Visit (INDEPENDENT_AMBULATORY_CARE_PROVIDER_SITE_OTHER): Payer: PPO | Admitting: Family Medicine

## 2022-05-26 VITALS — BP 120/60 | HR 68 | Temp 96.7°F | Ht 62.0 in | Wt 271.0 lb

## 2022-05-26 DIAGNOSIS — M545 Low back pain, unspecified: Secondary | ICD-10-CM | POA: Diagnosis not present

## 2022-05-26 DIAGNOSIS — M25552 Pain in left hip: Secondary | ICD-10-CM | POA: Diagnosis not present

## 2022-05-26 NOTE — Assessment & Plan Note (Signed)
[facility]  Unable to take nsaids due to xarelto.  Consider tramadol

## 2022-05-26 NOTE — Progress Notes (Signed)
Acute Office Visit  Subjective:    Patient ID: Helen West, female    DOB: 05/20/1955, 67 y.o.   MRN: [id number]  Chief Complaint  Patient presents with   Hip Pain    HPI: Patient is in today for left hip pain x 8 months, tylenol helps some but the last 3 days it has not helped at all. When laying down feels like someone is stabbing her with a knife, walking is painful also.taking tylenol, but not helping anymore.   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   Complication of anesthesia    Loss of vocal/voice x 2 months   Dysrhythmia    Endometrial cancer ([facility]) 11/02/2017   Headache    High cholesterol    History of radiation therapy 01/12/2018   11/14, 11/21, 11/27, 12/4, 01/12/18:  Vaginal cuff, 6 Gy in 5 fractions for a total dose of 30 Gy  Dr Antony Blackbird   Malignant neoplasm of overlapping sites of body of uterus ([facility]) 12/29/2017   xrt 11/19   Obesity    Post-menopausal bleeding     Past Surgical History:  Procedure Laterality Date   ATRIAL FIBRILLATION ABLATION N/A 05/15/2020   Procedure: ATRIAL FIBRILLATION ABLATION;  Surgeon: Regan Lemming, MD;  [facility];  Service: Cardiovascular;  Laterality: N/A;   ROBOTIC ASSISTED TOTAL HYSTERECTOMY WITH BILATERAL SALPINGO OOPHERECTOMY Bilateral 11/02/2017   Procedure: XI ROBOTIC ASSISTED TOTAL HYSTERECTOMY WITH BILATERAL SALPINGO OOPHORECTOMY;  Surgeon: Adolphus Birchwood, MD;  Location: [facility];  Service: Gynecology;  Laterality: Bilateral;   SENTINEL NODE BIOPSY N/A 11/02/2017   Procedure: SENTINEL NODE BIOPSY;  Surgeon: Adolphus Birchwood, MD;  Location: [facility];  Service: Gynecology;  Laterality: N/A;   TONSILLECTOMY     TOTAL HIP ARTHROPLASTY     TUBAL LIGATION      Family History  Problem Relation Age of Onset   Aneurysm Mother    Emphysema Father    Cancer Paternal Uncle     Social History    Socioeconomic History   Marital status: Married    Spouse name: Lennox Grumbles   Number of children: 2   Years of education: Not on file   Highest education level: Not on file  Occupational History   Not on file  Tobacco Use   Smoking status: Never   Smokeless tobacco: Never  Vaping Use   Vaping Use: Never used  Substance and Sexual Activity   Alcohol use: Yes    Comment: occasional social   Drug use: Never   Sexual activity: Not on file  Other Topics Concern   Not on file  Social History Narrative   2 children from previous marriage, 2 step children and many grandchildren    Social Determinants of Health   Financial Resource Strain: Low Risk  (12/09/2021)   Overall Financial Resource Strain (CARDIA)    Difficulty of Paying Living Expenses: Not hard at all  Food Insecurity: No Food Insecurity (12/09/2021)   Hunger Vital Sign    Worried About Running Out of Food in the Last Year: Never true    Ran Out of Food in the Last Year: Never true  Transportation Needs: No Transportation Needs (12/09/2021)   PRAPARE - Administrator, Civil Service (Medical): No    Lack of Transportation (Non-Medical): No  Physical Activity: Inactive (  12/09/2021)   Exercise Vital Sign    Days of Exercise per Week: 0 days    Minutes of Exercise per Session: 0 min  Stress: No Stress Concern Present (12/09/2021)   [employer] of Occupational Health - Occupational Stress Questionnaire    Feeling of Stress : Not at all  Social Connections: Moderately Integrated (05/26/2022)   Social Connection and Isolation Panel [NHANES]    Frequency of Communication with Friends and Family: More than three times a week    Frequency of Social Gatherings with Friends and Family: Twice a week    Attends Religious Services: Never    Database administrator or Organizations: No    Attends Engineer, structural: More than 4 times per year    Marital Status: Married  Catering manager Violence: Not At Risk  (12/09/2021)   Humiliation, Afraid, Rape, and Kick questionnaire    Fear of Current or Ex-Partner: No    Emotionally Abused: No    Physically Abused: No    Sexually Abused: No    Outpatient Medications Prior to Visit  Medication Sig Dispense Refill   acetaminophen (TYLENOL) 650 MG CR tablet Take 1,950 mg by mouth in the morning and at bedtime.     albuterol (VENTOLIN HFA) 108 (90 Base) MCG/ACT inhaler Inhale 2 puffs into the lungs every 6 (six) hours as needed for wheezing or shortness of breath. 8 g 2   chlorthalidone (HYGROTON) 25 MG tablet Take 1 tablet (25 mg total) by mouth daily. 90 tablet 1   fluticasone (FLONASE) 50 MCG/ACT nasal spray Place 2 sprays into both nostrils daily. 16 g 6   losartan (COZAAR) 100 MG tablet Take 1 tablet (100 mg total) by mouth daily. 90 tablet 1   meclizine (ANTIVERT) 25 MG tablet Take 1 tablet (25 mg total) by mouth 3 (three) times daily as needed for dizziness. 30 tablet 0   metoprolol succinate (TOPROL-XL) 50 MG 24 hr tablet Take 1 tablet (50 mg total) by mouth daily. TAKE WITH OR IMMEDIATELY FOLLOWING A MEAL. 90 tablet 1   pantoprazole (PROTONIX) 40 MG tablet Take 1 tablet (40 mg total) by mouth daily. 90 tablet 1   Respiratory Therapy Supplies (CARETOUCH CPAP & BIPAP HOSE) MISC by Does not apply route.     rivaroxaban (XARELTO) 20 MG TABS tablet TAKE ONE TABLET ONCE DAILY WITH SUPPER 90 tablet 1   rosuvastatin (CRESTOR) 10 MG tablet Take 1 tablet (10 mg total) by mouth daily. 90 tablet 1   No facility-administered medications prior to visit.    No Known Allergies  Review of Systems  Constitutional:  Negative for chills, fatigue and fever.  HENT:  Negative for congestion, ear pain, postnasal drip, rhinorrhea, sinus pressure, sinus pain and sore throat.   Respiratory:  Negative for cough and shortness of breath.   Cardiovascular:  Negative for chest pain.  Gastrointestinal:  Negative for diarrhea and nausea.  Musculoskeletal:        Left Hip Pain   Neurological:  Negative for dizziness and headaches.       Objective:        05/26/2022    9:03 AM 12/15/2021    1:45 PM 12/09/2021   10:19 AM  Vitals with BMI  Height 5\' 2"  5\' 2"  5\' 2"   Weight 271 lbs 262 lbs 264 lbs 13 oz  BMI 49.55 47.91 48.42  Systolic 120 120 161  Diastolic 60 76 78  Pulse 68 74 62    No data  found.   Physical Exam Vitals reviewed.  Constitutional:      Appearance: Normal appearance. She is obese.  Musculoskeletal:        General: Tenderness (posterior left buttock. mild tender over left trochanteric bursa., discomfort with flexion and external rotation.) present. No swelling.  Neurological:     Mental Status: She is alert.     Health Maintenance Due  Topic Date Due   DTaP/Tdap/Td (1 - Tdap) Never done   Zoster Vaccines- Shingrix (1 of 2) Never done   COLONOSCOPY (Pts 45-16yrs Insurance coverage will need to be confirmed)  09/04/2021   MAMMOGRAM  03/04/2022    There are no preventive care reminders to display for this patient.   Lab Results  Component Value Date   TSH 2.440 02/13/2020   Lab Results  Component Value Date   WBC 5.0 12/12/2021   HGB 13.7 12/12/2021   HCT 42.7 12/12/2021   MCV 94 12/12/2021   PLT 279 12/12/2021   Lab Results  Component Value Date   NA 140 12/12/2021   K 4.5 12/12/2021   CO2 28 12/12/2021   GLUCOSE 108 (H) 12/12/2021   BUN 17 12/12/2021   CREATININE 0.83 12/12/2021   BILITOT 0.4 12/12/2021   ALKPHOS 94 12/12/2021   AST 14 12/12/2021   ALT 10 12/12/2021   PROT 6.7 12/12/2021   ALBUMIN 4.4 12/12/2021   CALCIUM 9.7 12/12/2021   ANIONGAP 8 11/03/2017   EGFR 77 12/12/2021   Lab Results  Component Value Date   CHOL 161 12/12/2021   Lab Results  Component Value Date   HDL 53 12/12/2021   Lab Results  Component Value Date   LDLCALC 79 12/12/2021   Lab Results  Component Value Date   TRIG 171 (H) 12/12/2021   Lab Results  Component Value Date   CHOLHDL 3.0 12/12/2021   Lab Results   Component Value Date   HGBA1C 6.0 (H) 12/12/2021       Assessment & Plan:  Left hip pain Assessment & Plan: [facility]  Unable to take nsaids due to xarelto.  Consider tramadol      Follow-up: Return if symptoms worsen or fail to improve.  An After Visit Summary was printed and given to the patient.  Blane Ohara, MD [facility] [phone]

## 2022-06-09 ENCOUNTER — Other Ambulatory Visit: Payer: Self-pay | Admitting: Family Medicine

## 2022-06-09 DIAGNOSIS — M7062 Trochanteric bursitis, left hip: Secondary | ICD-10-CM | POA: Diagnosis not present

## 2022-06-09 DIAGNOSIS — Z6841 Body Mass Index (BMI) 40.0 and over, adult: Secondary | ICD-10-CM | POA: Diagnosis not present

## 2022-06-09 DIAGNOSIS — E782 Mixed hyperlipidemia: Secondary | ICD-10-CM

## 2022-06-09 DIAGNOSIS — M533 Sacrococcygeal disorders, not elsewhere classified: Secondary | ICD-10-CM | POA: Diagnosis not present

## 2022-06-09 DIAGNOSIS — M1612 Unilateral primary osteoarthritis, left hip: Secondary | ICD-10-CM | POA: Diagnosis not present

## 2022-06-09 DIAGNOSIS — I10 Essential (primary) hypertension: Secondary | ICD-10-CM

## 2022-06-18 ENCOUNTER — Other Ambulatory Visit: Payer: Self-pay

## 2022-06-18 ENCOUNTER — Encounter: Payer: Self-pay | Admitting: Family Medicine

## 2022-06-18 ENCOUNTER — Ambulatory Visit (INDEPENDENT_AMBULATORY_CARE_PROVIDER_SITE_OTHER): Payer: PPO | Admitting: Family Medicine

## 2022-06-18 VITALS — BP 110/70 | HR 72 | Temp 97.1°F | Resp 18 | Ht 62.0 in | Wt 257.0 lb

## 2022-06-18 DIAGNOSIS — E782 Mixed hyperlipidemia: Secondary | ICD-10-CM

## 2022-06-18 DIAGNOSIS — J309 Allergic rhinitis, unspecified: Secondary | ICD-10-CM | POA: Diagnosis not present

## 2022-06-18 DIAGNOSIS — R7309 Other abnormal glucose: Secondary | ICD-10-CM | POA: Diagnosis not present

## 2022-06-18 DIAGNOSIS — I10 Essential (primary) hypertension: Secondary | ICD-10-CM

## 2022-06-18 DIAGNOSIS — M25562 Pain in left knee: Secondary | ICD-10-CM | POA: Insufficient documentation

## 2022-06-18 DIAGNOSIS — G8929 Other chronic pain: Secondary | ICD-10-CM | POA: Insufficient documentation

## 2022-06-18 MED ORDER — LOSARTAN POTASSIUM 100 MG PO TABS: 100.0000 mg | ORAL_TABLET | Freq: Every day | ORAL | 0 refills | Status: DC

## 2022-06-18 MED ORDER — ALBUTEROL SULFATE HFA 108 (90 BASE) MCG/ACT IN AERS: 2.0000 | INHALATION_SPRAY | Freq: Four times a day (QID) | RESPIRATORY_TRACT | 2 refills | Status: DC | PRN

## 2022-06-18 MED ORDER — TRIAMCINOLONE ACETONIDE 40 MG/ML IJ SUSP
80.0000 mg | Freq: Once | INTRAMUSCULAR | Status: AC
Start: 2022-06-18 — End: 2022-06-18
  Administered 2022-06-18: 80 mg via INTRA_ARTICULAR

## 2022-06-18 MED ORDER — CHLORTHALIDONE 25 MG PO TABS: 25.0000 mg | ORAL_TABLET | Freq: Every day | ORAL | 0 refills | Status: DC

## 2022-06-18 MED ORDER — FLUTICASONE PROPIONATE 50 MCG/ACT NA SUSP: 2.0000 | Freq: Every day | NASAL | 6 refills | Status: DC

## 2022-06-18 MED ORDER — PANTOPRAZOLE SODIUM 40 MG PO TBEC: 40.0000 mg | DELAYED_RELEASE_TABLET | Freq: Every day | ORAL | 1 refills | Status: DC

## 2022-06-18 NOTE — Assessment & Plan Note (Signed)
Risks were discussed including bleeding, infection, increase in sugars if diabetic, atrophy at site of injection, and increased pain.  After consent was obtained, using sterile technique the left knee was prepped with alcohol.  The joint was entered and Kenalog 80 mg and 5 ml plain Lidocaine was then injected and the needle withdrawn.  The procedure was well tolerated.   The patient is asked to continue to rest the joint for a few more days before resuming regular activities.  It may be more painful for the first 1-2 days.  Watch for fever, or increased swelling or persistent pain in the joint. Call or return to clinic prn if such symptoms occur or there is failure to improve as anticipated. 

## 2022-06-18 NOTE — Progress Notes (Signed)
Acute Office Visit  Subjective:    Patient ID: Helen West, female    DOB: February 11, 1955, 67 y.o.   MRN: [id number]  Chief Complaint  Patient presents with   Knee Pain    Left     HPI: Patient is in today for left knee pain which has been going on for months.  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   Complication of anesthesia    Loss of vocal/voice x 2 months   Dysrhythmia    Endometrial cancer (HCC) 11/02/2017   Headache    High cholesterol    History of radiation therapy 01/12/2018   11/14, 11/21, 11/27, 12/4, 01/12/18:  Vaginal cuff, 6 Gy in 5 fractions for a total dose of 30 Gy  Dr Antony Blackbird   Malignant neoplasm of overlapping sites of body of uterus (HCC) 12/29/2017   xrt 11/19   Obesity    Post-menopausal bleeding     Past Surgical History:  Procedure Laterality Date   ATRIAL FIBRILLATION ABLATION N/A 05/15/2020   Procedure: ATRIAL FIBRILLATION ABLATION;  Surgeon: Regan Lemming, MD;  Location: [facility];  Service: Cardiovascular;  Laterality: N/A;   ROBOTIC ASSISTED TOTAL HYSTERECTOMY WITH BILATERAL SALPINGO OOPHERECTOMY Bilateral 11/02/2017   Procedure: XI ROBOTIC ASSISTED TOTAL HYSTERECTOMY WITH BILATERAL SALPINGO OOPHORECTOMY;  Surgeon: Adolphus Birchwood, MD;  Location: [facility];  Service: Gynecology;  Laterality: Bilateral;   SENTINEL NODE BIOPSY N/A 11/02/2017   Procedure: SENTINEL NODE BIOPSY;  Surgeon: Adolphus Birchwood, MD;  Location: [facility];  Service: Gynecology;  Laterality: N/A;   TONSILLECTOMY     TOTAL HIP ARTHROPLASTY     TUBAL LIGATION      Family History  Problem Relation Age of Onset   Aneurysm Mother    Emphysema Father    Cancer Paternal Uncle     Social History   Socioeconomic History   Marital status: Married    Spouse name: Lennox Grumbles   Number  of children: 2   Years of education: Not on file   Highest education level: Not on file  Occupational History   Not on file  Tobacco Use   Smoking status: Never   Smokeless tobacco: Never  Vaping Use   Vaping Use: Never used  Substance and Sexual Activity   Alcohol use: Yes    Comment: occasional social   Drug use: Never   Sexual activity: Not on file  Other Topics Concern   Not on file  Social History Narrative   2 children from previous marriage, 2 step children and many grandchildren    Social Determinants of Health   Financial Resource Strain: Low Risk  (12/09/2021)   Overall Financial Resource Strain (CARDIA)    Difficulty of Paying Living Expenses: Not hard at all  Food Insecurity: No Food Insecurity (12/09/2021)   Hunger Vital Sign    Worried About Running Out of Food in the Last Year: Never true    Ran Out of Food in the Last Year: Never true  Transportation Needs: No Transportation Needs (12/09/2021)   PRAPARE - Administrator, Civil Service (Medical): No    Lack of Transportation (Non-Medical): No  Physical Activity: Inactive (12/09/2021)   Exercise Vital Sign    Days of Exercise  per Week: 0 days    Minutes of Exercise per Session: 0 min  Stress: No Stress Concern Present (12/09/2021)   [employer] of Occupational Health - Occupational Stress Questionnaire    Feeling of Stress : Not at all  Social Connections: Moderately Integrated (05/26/2022)   Social Connection and Isolation Panel [NHANES]    Frequency of Communication with Friends and Family: More than three times a week    Frequency of Social Gatherings with Friends and Family: Twice a week    Attends Religious Services: Never    Database administrator or Organizations: No    Attends Engineer, structural: More than 4 times per year    Marital Status: Married  Catering manager Violence: Not At Risk (12/09/2021)   Humiliation, Afraid, Rape, and Kick questionnaire    Fear of Current or  Ex-Partner: No    Emotionally Abused: No    Physically Abused: No    Sexually Abused: No    Outpatient Medications Prior to Visit  Medication Sig Dispense Refill   acetaminophen (TYLENOL) 650 MG CR tablet Take 1,950 mg by mouth in the morning and at bedtime.     meclizine (ANTIVERT) 25 MG tablet Take 1 tablet (25 mg total) by mouth 3 (three) times daily as needed for dizziness. 30 tablet 0   metoprolol succinate (TOPROL-XL) 50 MG 24 hr tablet Take 1 tablet (50 mg total) by mouth daily. TAKE WITH OR IMMEDIATELY FOLLOWING A MEAL. 90 tablet 1   Respiratory Therapy Supplies (CARETOUCH CPAP & BIPAP HOSE) MISC by Does not apply route.     rivaroxaban (XARELTO) 20 MG TABS tablet TAKE ONE TABLET ONCE DAILY WITH SUPPER 90 tablet 1   rosuvastatin (CRESTOR) 10 MG tablet TAKE 1 TABLET BY MOUTH DAILY. 90 tablet 0   albuterol (VENTOLIN HFA) 108 (90 Base) MCG/ACT inhaler Inhale 2 puffs into the lungs every 6 (six) hours as needed for wheezing or shortness of breath. 8 g 2   chlorthalidone (HYGROTON) 25 MG tablet TAKE 1 TABLET BY MOUTH DAILY. 90 tablet 0   fluticasone (FLONASE) 50 MCG/ACT nasal spray Place 2 sprays into both nostrils daily. 16 g 6   losartan (COZAAR) 100 MG tablet TAKE 1 TABLET BY MOUTH DAILY. 90 tablet 0   pantoprazole (PROTONIX) 40 MG tablet Take 1 tablet (40 mg total) by mouth daily. 90 tablet 1   No facility-administered medications prior to visit.    No Known Allergies  Review of Systems  Constitutional:  Negative for chills and fever.  Respiratory:  Negative for cough.   Cardiovascular:  Negative for chest pain.  Musculoskeletal:  Positive for arthralgias and back pain.  Neurological:  Positive for dizziness. Negative for headaches.  Psychiatric/Behavioral:  Negative for dysphoric mood. The patient is not nervous/anxious.        Objective:        06/22/2022    1:24 PM 06/18/2022    7:50 AM 05/26/2022    9:03 AM  Vitals with BMI  Height 5\' 2"  5\' 2"  5\' 2"   Weight 258  lbs 257 lbs 271 lbs  BMI 47.18 46.99 49.55  Systolic 120 110 161  Diastolic 70 70 60  Pulse 78 72 68    No data found.   Physical Exam Vitals reviewed.  Constitutional:      Appearance: Normal appearance.  Musculoskeletal:        General: Tenderness (patella apprehension, tender left medial ligament. positive mcmurrays.) present.  Neurological:  Mental Status: She is alert.     Health Maintenance Due  Topic Date Due   DTaP/Tdap/Td (1 - Tdap) Never done   Zoster Vaccines- Shingrix (1 of 2) Never done   Colonoscopy  09/04/2021   MAMMOGRAM  03/04/2022    There are no preventive care reminders to display for this patient.   Lab Results  Component Value Date   TSH 3.110 06/18/2022   Lab Results  Component Value Date   WBC 4.8 06/18/2022   HGB 13.7 06/18/2022   HCT 43.0 06/18/2022   MCV 95 06/18/2022   PLT 282 06/18/2022   Lab Results  Component Value Date   NA 138 06/18/2022   K 4.7 06/18/2022   CO2 28 06/18/2022   GLUCOSE 99 06/18/2022   BUN 19 06/18/2022   CREATININE 0.85 06/18/2022   BILITOT 0.4 06/18/2022   ALKPHOS 88 06/18/2022   AST 16 06/18/2022   ALT 11 06/18/2022   PROT 6.8 06/18/2022   ALBUMIN 4.4 06/18/2022   CALCIUM 10.0 06/18/2022   ANIONGAP 8 11/03/2017   EGFR 74 06/18/2022   Lab Results  Component Value Date   CHOL 158 06/18/2022   Lab Results  Component Value Date   HDL 56 06/18/2022   Lab Results  Component Value Date   LDLCALC 81 06/18/2022   Lab Results  Component Value Date   TRIG 121 06/18/2022   Lab Results  Component Value Date   CHOLHDL 2.8 06/18/2022   Lab Results  Component Value Date   HGBA1C 6.1 (H) 06/18/2022       Assessment & Plan:  Acute pain of left knee Assessment & Plan: Risks were discussed including bleeding, infection, increase in sugars if diabetic, atrophy at site of injection, and increased pain.  After consent was obtained, using sterile technique the left knee was prepped with alcohol.   The joint was entered and Kenalog 80 mg and 5 ml plain Lidocaine was then injected and the needle withdrawn.  The procedure was well tolerated.   The patient is asked to continue to rest the joint for a few more days before resuming regular activities.  It may be more painful for the first 1-2 days.  Watch for fever, or increased swelling or persistent pain in the joint. Call or return to clinic prn if such symptoms occur or there is failure to improve as anticipated.   Orders: -     Triamcinolone Acetonide  Primary hypertension Assessment & Plan: Well controlled.  No changes to medicines.  Continue to work on eating a healthy diet and exercise.    Orders: -     Chlorthalidone; Take 1 tablet (25 mg total) by mouth daily.  Dispense: 90 tablet; Refill: 0 -     Losartan Potassium; Take 1 tablet (100 mg total) by mouth daily.  Dispense: 90 tablet; Refill: 0  Chronic allergic rhinitis Assessment & Plan: Sent Flonase nasal spray  Orders: -     Fluticasone Propionate; Place 2 sprays into both nostrils daily.  Dispense: 16 g; Refill: 6  Mixed hyperlipidemia Assessment & Plan: Well controlled.  No changes to medicines. Rosuvastatin 10 mg daily Continue to work on eating a healthy diet and exercise.  Labs drawn today.     Other orders -     Albuterol Sulfate HFA; Inhale 2 puffs into the lungs every 6 (six) hours as needed for wheezing or shortness of breath.  Dispense: 8 g; Refill: 2 -     Pantoprazole Sodium; Take  1 tablet (40 mg total) by mouth daily.  Dispense: 90 tablet; Refill: 1     Meds ordered this encounter  Medications   albuterol (VENTOLIN HFA) 108 (90 Base) MCG/ACT inhaler    Sig: Inhale 2 puffs into the lungs every 6 (six) hours as needed for wheezing or shortness of breath.    Dispense:  8 g    Refill:  2   chlorthalidone (HYGROTON) 25 MG tablet    Sig: Take 1 tablet (25 mg total) by mouth daily.    Dispense:  90 tablet    Refill:  0   fluticasone (FLONASE) 50  MCG/ACT nasal spray    Sig: Place 2 sprays into both nostrils daily.    Dispense:  16 g    Refill:  6   losartan (COZAAR) 100 MG tablet    Sig: Take 1 tablet (100 mg total) by mouth daily.    Dispense:  90 tablet    Refill:  0   pantoprazole (PROTONIX) 40 MG tablet    Sig: Take 1 tablet (40 mg total) by mouth daily.    Dispense:  90 tablet    Refill:  1    * * N O T I C E * * PRESCRIPTION PREVIOUSLY AUTHORIZED BY DOCTOR:BUTLER, ROBERT [phone]* * *   triamcinolone acetonide (KENALOG-40) injection 80 mg    No orders of the defined types were placed in this encounter.    Follow-up: No follow-ups on file.  An After Visit Summary was printed and given to the patient.   Clayborn Bigness I Leal-Borjas,acting as a scribe for Blane Ohara, MD.,have documented all relevant documentation on the behalf of Blane Ohara, MD,as directed by  Blane Ohara, MD while in the presence of Blane Ohara, MD.   Blane Ohara, MD [facility] [phone]

## 2022-06-19 LAB — HEMOGLOBIN A1C
Est. average glucose Bld gHb Est-mCnc: 128 mg/dL
Hgb A1c MFr Bld: 6.1 % — ABNORMAL HIGH (ref 4.8–5.6)

## 2022-06-19 LAB — CBC WITH DIFFERENTIAL/PLATELET
Basophils Absolute: 0 10*3/uL (ref 0.0–0.2)
Basos: 1 %
EOS (ABSOLUTE): 0.2 10*3/uL (ref 0.0–0.4)
Eos: 4 %
Hematocrit: 43 % (ref 34.0–46.6)
Hemoglobin: 13.7 g/dL (ref 11.1–15.9)
Immature Grans (Abs): 0 10*3/uL (ref 0.0–0.1)
Immature Granulocytes: 0 %
Lymphocytes Absolute: 1.4 10*3/uL (ref 0.7–3.1)
Lymphs: 29 %
MCH: 30.2 pg (ref 26.6–33.0)
MCHC: 31.9 g/dL (ref 31.5–35.7)
MCV: 95 fL (ref 79–97)
Monocytes Absolute: 0.4 10*3/uL (ref 0.1–0.9)
Monocytes: 9 %
Neutrophils Absolute: 2.8 10*3/uL (ref 1.4–7.0)
Neutrophils: 57 %
Platelets: 282 10*3/uL (ref 150–450)
RBC: 4.54 x10E6/uL (ref 3.77–5.28)
RDW: 12.8 % (ref 11.7–15.4)
WBC: 4.8 10*3/uL (ref 3.4–10.8)

## 2022-06-19 LAB — COMPREHENSIVE METABOLIC PANEL
ALT: 11 IU/L (ref 0–32)
AST: 16 IU/L (ref 0–40)
Albumin/Globulin Ratio: 1.8 (ref 1.2–2.2)
Albumin: 4.4 g/dL (ref 3.9–4.9)
Alkaline Phosphatase: 88 IU/L (ref 44–121)
BUN/Creatinine Ratio: 22 (ref 12–28)
BUN: 19 mg/dL (ref 8–27)
Bilirubin Total: 0.4 mg/dL (ref 0.0–1.2)
CO2: 28 mmol/L (ref 20–29)
Calcium: 10 mg/dL (ref 8.7–10.3)
Chloride: 94 mmol/L — ABNORMAL LOW (ref 96–106)
Creatinine, Ser: 0.85 mg/dL (ref 0.57–1.00)
Globulin, Total: 2.4 g/dL (ref 1.5–4.5)
Glucose: 99 mg/dL (ref 70–99)
Potassium: 4.7 mmol/L (ref 3.5–5.2)
Sodium: 138 mmol/L (ref 134–144)
Total Protein: 6.8 g/dL (ref 6.0–8.5)
eGFR: 74 mL/min/{1.73_m2} (ref >59)

## 2022-06-19 LAB — LIPID PANEL
Chol/HDL Ratio: 2.8 ratio (ref 0.0–4.4)
Cholesterol, Total: 158 mg/dL (ref 100–199)
HDL: 56 mg/dL (ref >39)
LDL Chol Calc (NIH): 81 mg/dL (ref 0–99)
Triglycerides: 121 mg/dL (ref 0–149)
VLDL Cholesterol Cal: 21 mg/dL (ref 5–40)

## 2022-06-19 LAB — TSH: TSH: 3.11 u[IU]/mL (ref 0.450–4.500)

## 2022-06-19 LAB — CARDIOVASCULAR RISK ASSESSMENT

## 2022-06-22 ENCOUNTER — Encounter: Payer: Self-pay | Admitting: Family Medicine

## 2022-06-22 ENCOUNTER — Ambulatory Visit (INDEPENDENT_AMBULATORY_CARE_PROVIDER_SITE_OTHER): Payer: PPO | Admitting: Family Medicine

## 2022-06-22 VITALS — BP 120/70 | HR 78 | Temp 97.1°F | Resp 16 | Ht 62.0 in | Wt 258.0 lb

## 2022-06-22 DIAGNOSIS — M25562 Pain in left knee: Secondary | ICD-10-CM

## 2022-06-22 DIAGNOSIS — I10 Essential (primary) hypertension: Secondary | ICD-10-CM

## 2022-06-22 DIAGNOSIS — M25552 Pain in left hip: Secondary | ICD-10-CM

## 2022-06-22 DIAGNOSIS — I480 Paroxysmal atrial fibrillation: Secondary | ICD-10-CM

## 2022-06-22 DIAGNOSIS — K219 Gastro-esophageal reflux disease without esophagitis: Secondary | ICD-10-CM | POA: Diagnosis not present

## 2022-06-22 DIAGNOSIS — Z1211 Encounter for screening for malignant neoplasm of colon: Secondary | ICD-10-CM | POA: Diagnosis not present

## 2022-06-22 DIAGNOSIS — Z1231 Encounter for screening mammogram for malignant neoplasm of breast: Secondary | ICD-10-CM

## 2022-06-22 DIAGNOSIS — J309 Allergic rhinitis, unspecified: Secondary | ICD-10-CM | POA: Insufficient documentation

## 2022-06-22 DIAGNOSIS — E782 Mixed hyperlipidemia: Secondary | ICD-10-CM | POA: Diagnosis not present

## 2022-06-22 NOTE — Assessment & Plan Note (Addendum)
Referral to [facility] for colonoscopy 

## 2022-06-22 NOTE — Assessment & Plan Note (Signed)
Continue chlorthalidone 25 mg daily, losartan 100 mg daily and metoprolol succinate daily. Healthy diet and exercise.

## 2022-06-22 NOTE — Assessment & Plan Note (Signed)
Well controlled.  No changes to medicines. Rosuvastatin 10 mg daily. Continue to work on eating a healthy diet and exercise.  Labs drawn today.  

## 2022-06-22 NOTE — Progress Notes (Signed)
Subjective:  Patient ID: Helen West, female    DOB: 03-Apr-1955  Age: 67 y.o. MRN: [id number]  Chief Complaint  Patient presents with   Medical Management of Chronic Issues    HPI Atrial fibrillation on xarelto 20 mg daily and metoprolol succinate 50 mg once daily.   OSA: She is on CPAP 20 pressure.  Hyperlipidemia: Current medications: Crestor 10 mg daily.  Hypertension: Current medications: Chlorthalidone 25 mg daily, Losartan 100 mg daily, Metoprolol succinate 50 mg daily  Diet: Healthy. Fruits and vegetable. Eats steak, Eats fish.   Exercise: currently unable to rider her bike due to knee pain.    GERD-Protonix 40 mg twice daily  Acute left knee pain:  pain is worse since steroid injection       06/22/2022    1:30 PM 06/18/2022    8:00 AM 05/26/2022    9:08 AM 12/09/2021   10:26 AM 11/29/2020    7:54 AM  Depression screen PHQ 2/9  Decreased Interest 0 0 0 0 0  Down, Depressed, Hopeless 0 0 0 0 0  PHQ - 2 Score 0 0 0 0 0  Altered sleeping 0      Tired, decreased energy 0      Change in appetite 0      Feeling bad or failure about yourself  0      Trouble concentrating 0      Moving slowly or fidgety/restless 0      Suicidal thoughts 0      PHQ-9 Score 0      Difficult doing work/chores Not difficult at all            06/22/2022    1:29 PM  Fall Risk   Falls in the past year? 0  Number falls in past yr: 0  Injury with Fall? 0  Risk for fall due to : Impaired mobility  Follow up Falls evaluation completed;Falls prevention discussed    Patient Care Team: Blane Ohara, MD as PCP - General (Family Medicine) Regan Lemming, MD as PCP - Electrophysiology (Cardiology) Quintella Reichert, MD as PCP - Sleep Medicine (Cardiology) Earvin Hansen, [facility] (Inactive) as Pharmacist (Pharmacist) Earvin Hansen, [facility] (Inactive) as Pharmacist (Pharmacist) Antony Blackbird, MD as Consulting Physician (Radiation Oncology)   Review of Systems  Constitutional:  Negative  for chills, fatigue and fever.  HENT:  Negative for congestion, rhinorrhea and sore throat.   Respiratory:  Negative for cough and shortness of breath.   Cardiovascular:  Negative for chest pain.  Gastrointestinal:  Negative for abdominal pain, constipation, diarrhea, nausea and vomiting.  Genitourinary:  Negative for dysuria and urgency.  Musculoskeletal:  Positive for arthralgias (left knee pain) and back pain. Negative for myalgias.  Neurological:  Negative for dizziness, weakness, light-headedness and headaches.  Psychiatric/Behavioral:  Negative for dysphoric mood. The patient is not nervous/anxious.     Current Outpatient Medications on File Prior to Visit  Medication Sig Dispense Refill   acetaminophen (TYLENOL) 650 MG CR tablet Take 1,950 mg by mouth in the morning and at bedtime.     albuterol (VENTOLIN HFA) 108 (90 Base) MCG/ACT inhaler Inhale 2 puffs into the lungs every 6 (six) hours as needed for wheezing or shortness of breath. 8 g 2   chlorthalidone (HYGROTON) 25 MG tablet Take 1 tablet (25 mg total) by mouth daily. 90 tablet 0   fluticasone (FLONASE) 50 MCG/ACT nasal spray Place 2 sprays into both nostrils daily. 16 g 6  losartan (COZAAR) 100 MG tablet Take 1 tablet (100 mg total) by mouth daily. 90 tablet 0   meclizine (ANTIVERT) 25 MG tablet Take 1 tablet (25 mg total) by mouth 3 (three) times daily as needed for dizziness. 30 tablet 0   metoprolol succinate (TOPROL-XL) 50 MG 24 hr tablet Take 1 tablet (50 mg total) by mouth daily. TAKE WITH OR IMMEDIATELY FOLLOWING A MEAL. 90 tablet 1   pantoprazole (PROTONIX) 40 MG tablet Take 1 tablet (40 mg total) by mouth daily. 90 tablet 1   Respiratory Therapy Supplies (CARETOUCH CPAP & BIPAP HOSE) MISC by Does not apply route.     rivaroxaban (XARELTO) 20 MG TABS tablet TAKE ONE TABLET ONCE DAILY WITH SUPPER 90 tablet 1   rosuvastatin (CRESTOR) 10 MG tablet TAKE 1 TABLET BY MOUTH DAILY. 90 tablet 0   No current  facility-administered medications on file prior to visit.   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   Complication of anesthesia    Loss of vocal/voice x 2 months   Dysrhythmia    Endometrial cancer (HCC) 11/02/2017   Headache    High cholesterol    History of radiation therapy 01/12/2018   11/14, 11/21, 11/27, 12/4, 01/12/18:  Vaginal cuff, 6 Gy in 5 fractions for a total dose of 30 Gy  Dr Antony Blackbird   Malignant neoplasm of overlapping sites of body of uterus (HCC) 12/29/2017   xrt 11/19   Obesity    Post-menopausal bleeding    Past Surgical History:  Procedure Laterality Date   ATRIAL FIBRILLATION ABLATION N/A 05/15/2020   Procedure: ATRIAL FIBRILLATION ABLATION;  Surgeon: Regan Lemming, MD;  Location: [facility];  Service: Cardiovascular;  Laterality: N/A;   ROBOTIC ASSISTED TOTAL HYSTERECTOMY WITH BILATERAL SALPINGO OOPHERECTOMY Bilateral 11/02/2017   Procedure: XI ROBOTIC ASSISTED TOTAL HYSTERECTOMY WITH BILATERAL SALPINGO OOPHORECTOMY;  Surgeon: Adolphus Birchwood, MD;  Location: [facility];  Service: Gynecology;  Laterality: Bilateral;   SENTINEL NODE BIOPSY N/A 11/02/2017   Procedure: SENTINEL NODE BIOPSY;  Surgeon: Adolphus Birchwood, MD;  Location: [facility];  Service: Gynecology;  Laterality: N/A;   TONSILLECTOMY     TOTAL HIP ARTHROPLASTY     TUBAL LIGATION      Family History  Problem Relation Age of Onset   Aneurysm Mother    Emphysema Father    Cancer Paternal Uncle    Social History   Socioeconomic History   Marital status: Married    Spouse name: Lennox Grumbles   Number of children: 2   Years of education: Not on file   Highest education level: Not on file  Occupational History   Not on file  Tobacco Use   Smoking status: Never   Smokeless tobacco: Never  Vaping Use   Vaping Use: Never used  Substance and Sexual Activity   Alcohol use: Yes    Comment:  occasional social   Drug use: Never   Sexual activity: Not on file  Other Topics Concern   Not on file  Social History Narrative   2 children from previous marriage, 2 step children and many grandchildren    Social Determinants of Health   Financial Resource Strain: Low Risk  (12/09/2021)   Overall Financial Resource Strain (CARDIA)    Difficulty of Paying Living Expenses: Not hard at all  Food Insecurity: No Food Insecurity (  12/09/2021)   Hunger Vital Sign    Worried About Running Out of Food in the Last Year: Never true    Ran Out of Food in the Last Year: Never true  Transportation Needs: No Transportation Needs (12/09/2021)   PRAPARE - Administrator, Civil Service (Medical): No    Lack of Transportation (Non-Medical): No  Physical Activity: Inactive (12/09/2021)   Exercise Vital Sign    Days of Exercise per Week: 0 days    Minutes of Exercise per Session: 0 min  Stress: No Stress Concern Present (12/09/2021)   [employer] of Occupational Health - Occupational Stress Questionnaire    Feeling of Stress : Not at all  Social Connections: Moderately Integrated (05/26/2022)   Social Connection and Isolation Panel [NHANES]    Frequency of Communication with Friends and Family: More than three times a week    Frequency of Social Gatherings with Friends and Family: Twice a week    Attends Religious Services: Never    Diplomatic Services operational officer: No    Attends Engineer, structural: More than 4 times per year    Marital Status: Married    Objective:  BP 120/70   Pulse 78   Temp (!) 97.1 F (36.2 C)   Resp 16   Ht 5\' 2"  (1.575 m)   Wt 258 lb (117 kg)   BMI 47.19 kg/m      06/22/2022    1:24 PM 06/18/2022    7:50 AM 05/26/2022    9:03 AM  BP/Weight  Systolic BP 120 110 120  Diastolic BP 70 70 60  Wt. (Lbs) 258 257 271  BMI 47.19 kg/m2 47.01 kg/m2 49.57 kg/m2    Physical Exam Vitals reviewed.  Constitutional:      Appearance: Normal  appearance. She is normal weight.  Neck:     Vascular: No carotid bruit.  Cardiovascular:     Rate and Rhythm: Normal rate and regular rhythm.     Heart sounds: Normal heart sounds.  Pulmonary:     Effort: Pulmonary effort is normal. No respiratory distress.     Breath sounds: Normal breath sounds.  Abdominal:     General: Abdomen is flat. Bowel sounds are normal.     Palpations: Abdomen is soft.     Tenderness: There is no abdominal tenderness.  Neurological:     Mental Status: She is alert and oriented to person, place, and time.  Psychiatric:        Mood and Affect: Mood normal.        Behavior: Behavior normal.     Diabetic Foot Exam - Simple   No data filed      Lab Results  Component Value Date   WBC 4.8 06/18/2022   HGB 13.7 06/18/2022   HCT 43.0 06/18/2022   PLT 282 06/18/2022   GLUCOSE 99 06/18/2022   CHOL 158 06/18/2022   TRIG 121 06/18/2022   HDL 56 06/18/2022   LDLCALC 81 06/18/2022   ALT 11 06/18/2022   AST 16 06/18/2022   NA 138 06/18/2022   K 4.7 06/18/2022   CL 94 (L) 06/18/2022   CREATININE 0.85 06/18/2022   BUN 19 06/18/2022   CO2 28 06/18/2022   TSH 3.110 06/18/2022   HGBA1C 6.1 (H) 06/18/2022      Assessment & Plan:    Mixed hyperlipidemia Assessment & Plan: Healthy diet.  Continue crestor 10 mg daily.     Primary hypertension Assessment &  Plan: Continue chlorthalidone 25 mg daily, losartan 100 mg daily and metoprolol succinate daily. Healthy diet and exercise.      Gastroesophageal reflux disease without esophagitis Assessment & Plan: Continue protonix 40 mg daily.     Screening for colon cancer -     Ambulatory referral to Gastroenterology  Encounter for screening mammogram for malignant neoplasm of breast -     3D Screening Mammogram, Left and Right; Future  Acute pain of left knee  PAF (paroxysmal atrial fibrillation) (HCC) Assessment & Plan: Continue xarelto 20 mg daily and metoprolol succinate 50 mg  daily.    Left hip pain Assessment & Plan: Patient to call current orthopedic office to inquire if they can schedule evaluation for knee pain. Patient to call back if referral from our office is needed.   Tylenol as needed pain.     Colon cancer screening Assessment & Plan: Referral to [facility] for colonoscopy.      No orders of the defined types were placed in this encounter.   Orders Placed This Encounter  Procedures   MM 3D SCREENING MAMMOGRAM BILATERAL BREAST   Ambulatory referral to Gastroenterology     Follow-up: Return in about 6 months (around 12/23/2022) for chronic follow up, fasting.   I,Carolyn M Morrison,acting as a Neurosurgeon for Blane Ohara, MD.,have documented all relevant documentation on the behalf of Blane Ohara, MD,as directed by  Blane Ohara, MD while in the presence of Blane Ohara, MD.   An After Visit Summary was printed and given to the patient.  Blane Ohara, MD [facility] [phone]

## 2022-06-22 NOTE — Assessment & Plan Note (Signed)
Sent Flonase nasal spray

## 2022-06-22 NOTE — Assessment & Plan Note (Signed)
Healthy diet.  Continue crestor 10 mg daily.

## 2022-06-22 NOTE — Assessment & Plan Note (Signed)
Well controlled. No changes to medicines.  Continue to work on eating a healthy diet and exercise.    

## 2022-06-22 NOTE — Assessment & Plan Note (Signed)
Patient to call current orthopedic office to inquire if they can schedule evaluation for knee pain. Patient to call back if referral from our office is needed.   Tylenol as needed pain.

## 2022-06-22 NOTE — Assessment & Plan Note (Signed)
Continue xarelto 20 mg daily and metoprolol succinate 50 mg daily.

## 2022-06-22 NOTE — Assessment & Plan Note (Signed)
Continue protonix 40mg daily.  

## 2022-06-24 DIAGNOSIS — M25562 Pain in left knee: Secondary | ICD-10-CM | POA: Diagnosis not present

## 2022-06-25 ENCOUNTER — Telehealth: Payer: Self-pay

## 2022-06-25 NOTE — Progress Notes (Signed)
Care Management & Coordination Services Pharmacy Team  Reason for Encounter: General adherence update   Contacted patient for general health update and medication adherence call.  Spoke with patient on 06/25/2022    What concerns do you have about your medications? No concerns   The patient denies side effects with their medications.   How often do you forget or accidentally miss a dose? Never  Do you use a pillbox? Yes  Are you having any problems getting your medications from your pharmacy? No  Has the cost of your medications been a concern? No  Since last visit with PharmD, no interventions have been made.   The patient has not had an ED visit since last contact.   The patient reports the following problems with their health.  Pt has been following up with Dr. Sedalia Muta and Ortho for knee pain. They took xrays and its bone on bone so she is having to elevate it and keep ice on it to help with the pain. Pt has a follow up in 4 weeks with Ortho to hopefully get her some relief and develop a plan of care. Pt can walk with a cane but has to limit it so she can keep her pain levels to a minimum. Pt reports everything else is doing really good for her and has no other complaints.    Patient denies concerns or questions for Artelia Laroche, PharmD at this time.   Counseled patient on: Great job taking medications   Chart Updates:  Recent office visits:  06/22/22 Mickey Farber MD. Seen for follow up. Referral to Gastroenterology. No med changes.   06/18/22 Mickey Farber MD. Seen for knee pain. No med changes.  Administered Kenalog 80mg .  05/26/22 [name], Kirsten MD. Seen for hip pain. No med changes.   Recent consult visits:  06/24/22-05/26/22 Roland Earl PA-C. ([facility]). Seen for follow up. No med changes.   Hospital visits:  None  Medications: Outpatient Encounter Medications as of 06/25/2022  Medication Sig   acetaminophen (TYLENOL) 650 MG CR tablet Take 1,950 mg by mouth  in the morning and at bedtime.   albuterol (VENTOLIN HFA) 108 (90 Base) MCG/ACT inhaler Inhale 2 puffs into the lungs every 6 (six) hours as needed for wheezing or shortness of breath.   chlorthalidone (HYGROTON) 25 MG tablet Take 1 tablet (25 mg total) by mouth daily.   fluticasone (FLONASE) 50 MCG/ACT nasal spray Place 2 sprays into both nostrils daily.   losartan (COZAAR) 100 MG tablet Take 1 tablet (100 mg total) by mouth daily.   meclizine (ANTIVERT) 25 MG tablet Take 1 tablet (25 mg total) by mouth 3 (three) times daily as needed for dizziness.   metoprolol succinate (TOPROL-XL) 50 MG 24 hr tablet Take 1 tablet (50 mg total) by mouth daily. TAKE WITH OR IMMEDIATELY FOLLOWING A MEAL.   pantoprazole (PROTONIX) 40 MG tablet Take 1 tablet (40 mg total) by mouth daily.   Respiratory Therapy Supplies (CARETOUCH CPAP & BIPAP HOSE) MISC by Does not apply route.   rivaroxaban (XARELTO) 20 MG TABS tablet TAKE ONE TABLET ONCE DAILY WITH SUPPER   rosuvastatin (CRESTOR) 10 MG tablet TAKE 1 TABLET BY MOUTH DAILY.   No facility-administered encounter medications on file as of 06/25/2022.    Recent vitals BP Readings from Last 3 Encounters:  06/22/22 120/70  06/18/22 110/70  05/26/22 120/60   Pulse Readings from Last 3 Encounters:  06/22/22 78  06/18/22 72  05/26/22 68   Wt Readings  from Last 3 Encounters:  06/22/22 258 lb (117 kg)  06/18/22 257 lb (116.6 kg)  05/26/22 271 lb (122.9 kg)   BMI Readings from Last 3 Encounters:  06/22/22 47.19 kg/m  06/18/22 47.01 kg/m  05/26/22 49.57 kg/m    Recent lab results    Component Value Date/Time   NA 138 06/18/2022 0821   K 4.7 06/18/2022 0821   CL 94 (L) 06/18/2022 0821   CO2 28 06/18/2022 0821   GLUCOSE 99 06/18/2022 0821   GLUCOSE 126 (H) 11/03/2017 0322   BUN 19 06/18/2022 0821   CREATININE 0.85 06/18/2022 0821   CALCIUM 10.0 06/18/2022 0821    Lab Results  Component Value Date   CREATININE 0.85 06/18/2022   EGFR 74 06/18/2022    GFRNONAA 63 02/15/2020   GFRAA 72 02/15/2020   Lab Results  Component Value Date/Time   HGBA1C 6.1 (H) 06/18/2022 08:21 AM   HGBA1C 6.0 (H) 12/12/2021 07:49 AM    Lab Results  Component Value Date   CHOL 158 06/18/2022   HDL 56 06/18/2022   LDLCALC 81 06/18/2022   TRIG 121 06/18/2022   CHOLHDL 2.8 06/18/2022    Care Gaps: Annual wellness visit in last year? Yes Colonoscopy: Overdue since 09/04/21 If Diabetic: N/A Last eye exam / retinopathy screening: Last diabetic foot exam: Last UACR:   Star Rating Drugs:  Medication:  Last Fill: Day Supply Losartan   06/09/22-03/12/22  90ds Rosuvastatin   06/09/22-03/12/22  90ds   Roxana Hires, CMA Clinical Pharmacist Assistant  [phone]

## 2022-06-27 ENCOUNTER — Encounter: Payer: Self-pay | Admitting: Family Medicine

## 2022-06-27 NOTE — Assessment & Plan Note (Signed)
Recommend give injection time to work.

## 2022-07-06 DIAGNOSIS — D6869 Other thrombophilia: Secondary | ICD-10-CM | POA: Diagnosis not present

## 2022-07-06 DIAGNOSIS — I4891 Unspecified atrial fibrillation: Secondary | ICD-10-CM | POA: Diagnosis not present

## 2022-07-08 ENCOUNTER — Ambulatory Visit
Admission: RE | Admit: 2022-07-08 | Discharge: 2022-07-08 | Disposition: A | Discharge status: Home / Self Care | Payer: PPO | Source: Ambulatory Visit | Attending: Family Medicine | Admitting: Family Medicine

## 2022-07-08 DIAGNOSIS — Z1231 Encounter for screening mammogram for malignant neoplasm of breast: Secondary | ICD-10-CM | POA: Diagnosis not present

## 2022-07-14 DIAGNOSIS — G4733 Obstructive sleep apnea (adult) (pediatric): Secondary | ICD-10-CM | POA: Diagnosis not present

## 2022-07-22 ENCOUNTER — Telehealth: Payer: Self-pay | Admitting: Internal Medicine

## 2022-07-22 DIAGNOSIS — M1712 Unilateral primary osteoarthritis, left knee: Secondary | ICD-10-CM | POA: Diagnosis not present

## 2022-07-22 DIAGNOSIS — Z6841 Body Mass Index (BMI) 40.0 and over, adult: Secondary | ICD-10-CM | POA: Diagnosis not present

## 2022-07-22 NOTE — Telephone Encounter (Signed)
Unfortunately, my practice is too busy to accommodate at this time. Maybe one of our newer associates would have availability.  Thanks

## 2022-07-22 NOTE — Telephone Encounter (Signed)
Dr. Tomasa Rand would you be willing to accommodate this PT's transfer request.

## 2022-07-22 NOTE — Telephone Encounter (Signed)
Good afternoon Dr. Marina Goodell  The following patient was referred to [place] for a colonoscopy.  She had one 5 years ago with [facility]. She did not want to continue care with them because the doctor retired. Records are available on Epic for review. Please advise of scheduling. Thank you

## 2022-07-23 ENCOUNTER — Encounter: Payer: Self-pay | Admitting: Gastroenterology

## 2022-07-23 NOTE — Telephone Encounter (Signed)
OV scheduled for 9/4 @ 1010 to discuss colon

## 2022-08-12 ENCOUNTER — Other Ambulatory Visit: Payer: Self-pay | Admitting: Cardiology

## 2022-08-12 DIAGNOSIS — I480 Paroxysmal atrial fibrillation: Secondary | ICD-10-CM

## 2022-08-12 NOTE — Telephone Encounter (Signed)
Prescription refill request for Xarelto received.  Indication: afib  Last office visit: [place] 08/04/2021 Weight: 117 kg  Age: 67 yo  Scr: 0.85, 06/18/2022 CrCl: 115 ml/min   Pt due to see Dr. Elberta Fortis 09/28/2022

## 2022-08-25 ENCOUNTER — Other Ambulatory Visit: Payer: Self-pay | Admitting: Family Medicine

## 2022-08-25 DIAGNOSIS — E782 Mixed hyperlipidemia: Secondary | ICD-10-CM

## 2022-08-28 ENCOUNTER — Ambulatory Visit: Payer: PPO | Admitting: Cardiology

## 2022-09-17 DIAGNOSIS — M1712 Unilateral primary osteoarthritis, left knee: Secondary | ICD-10-CM | POA: Diagnosis not present

## 2022-09-21 ENCOUNTER — Encounter: Payer: Self-pay | Admitting: Cardiology

## 2022-09-21 ENCOUNTER — Ambulatory Visit: Payer: PPO | Admitting: Cardiology

## 2022-09-21 VITALS — BP 110/80 | HR 72 | Ht 62.0 in | Wt 243.0 lb

## 2022-09-21 DIAGNOSIS — G4733 Obstructive sleep apnea (adult) (pediatric): Secondary | ICD-10-CM | POA: Diagnosis not present

## 2022-09-21 NOTE — Progress Notes (Signed)
Cardiology Office Note:    Date:  09/21/2022   ID:  Helen West, DOB [birth year], MRN [id number]  PCP:  Blane Ohara, MD  Cardiologist:  Lorenso Courier, MD  Referring MD: Blane Ohara, MD   Chief Complaint  Patient presents with   Sleep Apnea    History of Present Illness:    Helen West is a 67 y.o. female with a hx of paroxysmal atrial fibrillation and hyperlipidemia.  She is followed by Dr. Elberta Fortis and underwent A. fib ablation on 05/15/2020.  Due to atrial fibrillation she underwent sleep study on 06/14/2020 which showed moderate obstructive sleep apnea with an AHI of 23.7/hr and no significant central sleep apnea.  She had nocturnal hypoxemia with lowest oxygen 72% with moderate snoring.  She underwent CPAP titration to 18 cm H2O and is now here for follow-up.  She is doing well with her PAP device and thinks that she has gotten used to it.  She tolerates the full face mask and feels the pressure is adequate.  Since going on PAP she feels rested in the am and has no significant daytime sleepiness.  She denies any significant  nasal dryness or nasal congestion.  She does have problems with mouth dryness. She does not think that she snores.    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   Complication of anesthesia    Loss of vocal/voice x 2 months   Dysrhythmia    Endometrial cancer (HCC) 11/02/2017   Headache    High cholesterol    History of radiation therapy 01/12/2018   11/14, 11/21, 11/27, 12/4, 01/12/18:  Vaginal cuff, 6 Gy in 5 fractions for a total dose of 30 Gy  Dr Antony Blackbird   Malignant neoplasm of overlapping sites of body of uterus (HCC) 12/29/2017   xrt 11/19   Obesity    Post-menopausal bleeding     Past Surgical History:  Procedure Laterality Date   ATRIAL FIBRILLATION ABLATION N/A 05/15/2020   Procedure: ATRIAL FIBRILLATION ABLATION;  Surgeon:  Regan Lemming, MD;  Location: [facility];  Service: Cardiovascular;  Laterality: N/A;   ROBOTIC ASSISTED TOTAL HYSTERECTOMY WITH BILATERAL SALPINGO OOPHERECTOMY Bilateral 11/02/2017   Procedure: XI ROBOTIC ASSISTED TOTAL HYSTERECTOMY WITH BILATERAL SALPINGO OOPHORECTOMY;  Surgeon: Adolphus Birchwood, MD;  Location: [facility];  Service: Gynecology;  Laterality: Bilateral;   SENTINEL NODE BIOPSY N/A 11/02/2017   Procedure: SENTINEL NODE BIOPSY;  Surgeon: Adolphus Birchwood, MD;  Location: [facility];  Service: Gynecology;  Laterality: N/A;   TONSILLECTOMY     TOTAL HIP ARTHROPLASTY     TUBAL LIGATION      Current Medications: Current Meds  Medication Sig   acetaminophen (TYLENOL) 650 MG CR tablet Take 1,950 mg by mouth in the morning and at bedtime.   albuterol (VENTOLIN HFA) 108 (90 Base) MCG/ACT inhaler Inhale 2 puffs into the lungs every 6 (six) hours as needed for wheezing or shortness of breath.   chlorthalidone (HYGROTON) 25 MG tablet Take 1 tablet (25 mg total) by mouth daily.   fluticasone (FLONASE) 50 MCG/ACT nasal spray Place 2 sprays into both nostrils daily.   losartan (COZAAR) 100 MG tablet Take 1 tablet (100 mg total) by mouth daily.   meclizine (ANTIVERT) 25 MG tablet Take 1 tablet (25 mg total) by mouth 3 (three) times daily as needed for  dizziness.   metoprolol succinate (TOPROL-XL) 50 MG 24 hr tablet Take 1 tablet (50 mg total) by mouth daily. TAKE WITH OR IMMEDIATELY FOLLOWING A MEAL.   pantoprazole (PROTONIX) 40 MG tablet Take 1 tablet (40 mg total) by mouth daily.   Respiratory Therapy Supplies (CARETOUCH CPAP & BIPAP HOSE) MISC by Does not apply route.   rosuvastatin (CRESTOR) 10 MG tablet TAKE 1 TABLET BY MOUTH DAILY   XARELTO 20 MG TABS tablet TAKE 1 TABLET ONCE DAILY WITH SUPPER     Allergies:   Patient has no known allergies.   Social History   Socioeconomic History   Marital status: Married    Spouse name: Lennox Grumbles   Number of children: 2   Years of education: Not on  file   Highest education level: Not on file  Occupational History   Not on file  Tobacco Use   Smoking status: Never   Smokeless tobacco: Never  Vaping Use   Vaping status: Never Used  Substance and Sexual Activity   Alcohol use: Yes    Comment: occasional social   Drug use: Never   Sexual activity: Not on file  Other Topics Concern   Not on file  Social History Narrative   2 children from previous marriage, 2 step children and many grandchildren    Social Determinants of Health   Financial Resource Strain: Low Risk  (12/09/2021)   Overall Financial Resource Strain (CARDIA)    Difficulty of Paying Living Expenses: Not hard at all  Food Insecurity: No Food Insecurity (12/09/2021)   Hunger Vital Sign    Worried About Running Out of Food in the Last Year: Never true    Ran Out of Food in the Last Year: Never true  Transportation Needs: No Transportation Needs (12/09/2021)   PRAPARE - Administrator, Civil Service (Medical): No    Lack of Transportation (Non-Medical): No  Physical Activity: Inactive (12/09/2021)   Exercise Vital Sign    Days of Exercise per Week: 0 days    Minutes of Exercise per Session: 0 min  Stress: No Stress Concern Present (12/09/2021)   [employer] of Occupational Health - Occupational Stress Questionnaire    Feeling of Stress : Not at all  Social Connections: Moderately Integrated (05/26/2022)   Social Connection and Isolation Panel [NHANES]    Frequency of Communication with Friends and Family: More than three times a week    Frequency of Social Gatherings with Friends and Family: Twice a week    Attends Religious Services: Never    Database administrator or Organizations: No    Attends Engineer, structural: More than 4 times per year    Marital Status: Married     Family History: The patient's Shefamily history includes Aneurysm in her mother; Cancer in her paternal uncle; Emphysema in her father. There is no history of  Breast cancer.  ROS:   Please see the history of present illness.    ROS  All other systems reviewed and negative.   EKGs/Labs/Other Studies Reviewed:    The following studies were reviewed today: PSG, CPAP titration and PAP download   Recent Labs: 06/18/2022: ALT 11; BUN 19; Creatinine, Ser 0.85; Hemoglobin 13.7; Platelets 282; Potassium 4.7; Sodium 138; TSH 3.110   Recent Lipid Panel    Component Value Date/Time   CHOL 158 06/18/2022 0821   TRIG 121 06/18/2022 0821   HDL 56 06/18/2022 0821   CHOLHDL 2.8 06/18/2022 0821   LDLCALC  81 06/18/2022 0821     Physical Exam:    VS:  BP 110/80 (BP Location: Left Arm, Patient Position: Sitting, Cuff Size: Normal)   Pulse 72   Ht 5\' 2"  (1.575 m)   Wt 243 lb (110.2 kg)   SpO2 98%   BMI 44.45 kg/m     Wt Readings from Last 3 Encounters:  09/21/22 243 lb (110.2 kg)  06/22/22 258 lb (117 kg)  06/18/22 257 lb (116.6 kg)    GEN: Well nourished, well developed in no acute distress HEENT: Normal NECK: No JVD; No carotid bruits LYMPHATICS: No lymphadenopathy CARDIAC:RRR, no murmurs, rubs, gallops RESPIRATORY:  Clear to auscultation without rales, wheezing or rhonchi  ABDOMEN: Soft, non-tender, non-distended MUSCULOSKELETAL:  No edema; No deformity  SKIN: Warm and dry NEUROLOGIC:  Alert and oriented x 3 PSYCHIATRIC:  Normal affect  ASSESSMENT:    1. OSA (obstructive sleep apnea)     PLAN:    In order of problems listed above:  OSA - The patient is tolerating PAP therapy well without any problems. The PAP download performed by his DME was personally reviewed and interpreted by me today and showed an AHI of 0.9/hr on auto CPAP from 4 to 20 cm H2O with 90% compliance in using more than 4 hours nightly.  The patient has been using and benefiting from PAP use and will continue to benefit from therapy.  -I encouraged her to try to adjust her humidity to help with mouth dryness     Medication Adjustments/Labs and Tests  Ordered: Current medicines are reviewed at length with the patient today.  Concerns regarding medicines are outlined above.  No orders of the defined types were placed in this encounter.  No orders of the defined types were placed in this encounter.   Signed, Armanda Magic, MD  09/21/2022 11:49 AM    [facility]

## 2022-09-21 NOTE — Patient Instructions (Signed)
Medication Instructions:  Your physician recommends that you continue on your current medications as directed. Please refer to the Current Medication list given to you today.  *If you need a refill on your cardiac medications before your next appointment, please call your pharmacy*   Lab Work: None.  If you have labs (blood work) drawn today and your tests are completely normal, you will receive your results only by: MyChart Message (if you have MyChart) OR A paper copy in the mail If you have any lab test that is abnormal or we need to change your treatment, we will call you to review the results.   Testing/Procedures: None.   Follow-Up: At  [facility]Care, you and your health needs are our priority.  As part of our continuing mission to provide you with exceptional heart care, we have created designated Provider Care Teams.  These Care Teams include your primary Cardiologist (physician) and Advanced Practice Providers (APPs -  Physician Assistants and Nurse Practitioners) who all work together to provide you with the care you need, when you need it.  We recommend signing up for the patient portal called "MyChart".  Sign up information is provided on this After Visit Summary.  MyChart is used to connect with patients for Virtual Visits (Telemedicine).  Patients are able to view lab/test results, encounter notes, upcoming appointments, etc.  Non-urgent messages can be sent to your provider as well.   To learn more about what you can do with MyChart, go to [email or web address].    Your next appointment:   1 year(s)  Provider:   Dr. Traci Turner, MD   

## 2022-09-24 DIAGNOSIS — M1712 Unilateral primary osteoarthritis, left knee: Secondary | ICD-10-CM | POA: Diagnosis not present

## 2022-09-25 ENCOUNTER — Other Ambulatory Visit: Payer: Self-pay

## 2022-09-25 ENCOUNTER — Telehealth: Payer: Self-pay

## 2022-09-25 ENCOUNTER — Encounter: Payer: Self-pay | Admitting: Family Medicine

## 2022-09-25 ENCOUNTER — Telehealth: Payer: PPO | Admitting: Family Medicine

## 2022-09-25 VITALS — Temp 98.3°F | Ht 62.0 in | Wt 243.0 lb

## 2022-09-25 DIAGNOSIS — U071 COVID-19: Secondary | ICD-10-CM

## 2022-09-25 DIAGNOSIS — J069 Acute upper respiratory infection, unspecified: Secondary | ICD-10-CM | POA: Diagnosis not present

## 2022-09-25 MED ORDER — NIRMATRELVIR/RITONAVIR (PAXLOVID)TABLET: 3.0000 | ORAL_TABLET | Freq: Two times a day (BID) | ORAL | 0 refills | Status: AC

## 2022-09-25 MED ORDER — HYDROCODONE BIT-HOMATROP MBR 5-1.5 MG/5ML PO SOLN: 5.0000 mL | Freq: Four times a day (QID) | ORAL | 0 refills | Status: AC | PRN

## 2022-09-25 NOTE — Progress Notes (Signed)
Virtual Visit via Video Note   This visit type was conducted either per patient request OR due to national recommendations for restrictions regarding the COVID-19 Pandemic (e.g. social distancing) in an effort to limit this patient's exposure and mitigate transmission in our community.  Due to her co-morbid illnesses, this patient is at least at moderate risk for complications without adequate follow up.  This format is felt to be most appropriate for this patient at this time.  All issues noted in this document were discussed and addressed.  A limited physical exam was performed with this format.  A verbal consent was obtained for the virtual visit.   Date:  09/25/2022   ID:  Helen West, DOB 01-13-1956, MRN [id number]  Patient Location: Home Provider Location: Office/Clinic  PCP:  Blane Ohara, MD   Chief Complaint:  Covid positive  History of Present Illness:    Helen West is a 67 y.o. female with headache, body aches, low grade fever, nasal congestion, cough, sore throat since 3 days ago. She did rapid covid test today and it was positive.   She denies SOB, chest pain.  Chief Complaint  Patient presents with   Covid Positive  .  The patient does have symptoms concerning for COVID-19 infection (fever, chills, cough, or new shortness of breath).    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   Complication of anesthesia    Loss of vocal/voice x 2 months   Dysrhythmia    Endometrial cancer (HCC) 11/02/2017   Headache    High cholesterol    History of radiation therapy 01/12/2018   11/14, 11/21, 11/27, 12/4, 01/12/18:  Vaginal cuff, 6 Gy in 5 fractions for a total dose of 30 Gy  Dr Antony Blackbird   Malignant neoplasm of overlapping sites of body of uterus (HCC) 12/29/2017   xrt 11/19   Obesity    Post-menopausal bleeding     Past Surgical History:   Procedure Laterality Date   ATRIAL FIBRILLATION ABLATION N/A 05/15/2020   Procedure: ATRIAL FIBRILLATION ABLATION;  Surgeon: Regan Lemming, MD;  Location: [facility];  Service: Cardiovascular;  Laterality: N/A;   ROBOTIC ASSISTED TOTAL HYSTERECTOMY WITH BILATERAL SALPINGO OOPHERECTOMY Bilateral 11/02/2017   Procedure: XI ROBOTIC ASSISTED TOTAL HYSTERECTOMY WITH BILATERAL SALPINGO OOPHORECTOMY;  Surgeon: Adolphus Birchwood, MD;  Location: [facility];  Service: Gynecology;  Laterality: Bilateral;   SENTINEL NODE BIOPSY N/A 11/02/2017   Procedure: SENTINEL NODE BIOPSY;  Surgeon: Adolphus Birchwood, MD;  Location: [facility];  Service: Gynecology;  Laterality: N/A;   TONSILLECTOMY     TOTAL HIP ARTHROPLASTY     TUBAL LIGATION      Family History  Problem Relation Age of Onset   Aneurysm Mother    Emphysema Father    Cancer Paternal Uncle    Breast cancer Neg Hx     Social History   Socioeconomic History   Marital status: Married    Spouse name: Helen West   Number of children: 2   Years of education: Not on file   Highest education level: Not on file  Occupational History   Not on file  Tobacco Use   Smoking status: Never   Smokeless tobacco: Never  Vaping Use   Vaping status: Never Used  Substance and Sexual Activity   Alcohol use: Yes  Comment: occasional social   Drug use: Never   Sexual activity: Not on file  Other Topics Concern   Not on file  Social History Narrative   2 children from previous marriage, 2 step children and many grandchildren    Social Determinants of Health   Financial Resource Strain: Low Risk  (12/09/2021)   Overall Financial Resource Strain (CARDIA)    Difficulty of Paying Living Expenses: Not hard at all  Food Insecurity: No Food Insecurity (12/09/2021)   Hunger Vital Sign    Worried About Running Out of Food in the Last Year: Never true    Ran Out of Food in the Last Year: Never true  Transportation Needs: No Transportation Needs (12/09/2021)   PRAPARE  - Administrator, Civil Service (Medical): No    Lack of Transportation (Non-Medical): No  Physical Activity: Inactive (12/09/2021)   Exercise Vital Sign    Days of Exercise per Week: 0 days    Minutes of Exercise per Session: 0 min  Stress: No Stress Concern Present (12/09/2021)   [employer] of Occupational Health - Occupational Stress Questionnaire    Feeling of Stress : Not at all  Social Connections: Moderately Integrated (05/26/2022)   Social Connection and Isolation Panel [NHANES]    Frequency of Communication with Friends and Family: More than three times a week    Frequency of Social Gatherings with Friends and Family: Twice a week    Attends Religious Services: Never    Database administrator or Organizations: No    Attends Engineer, structural: More than 4 times per year    Marital Status: Married  Catering manager Violence: Not At Risk (12/09/2021)   Humiliation, Afraid, Rape, and Kick questionnaire    Fear of Current or Ex-Partner: No    Emotionally Abused: No    Physically Abused: No    Sexually Abused: No    Outpatient Medications Prior to Visit  Medication Sig Dispense Refill   acetaminophen (TYLENOL) 650 MG CR tablet Take 1,950 mg by mouth in the morning and at bedtime.     albuterol (VENTOLIN HFA) 108 (90 Base) MCG/ACT inhaler Inhale 2 puffs into the lungs every 6 (six) hours as needed for wheezing or shortness of breath. 8 g 2   chlorthalidone (HYGROTON) 25 MG tablet Take 1 tablet (25 mg total) by mouth daily. 90 tablet 0   fluticasone (FLONASE) 50 MCG/ACT nasal spray Place 2 sprays into both nostrils daily. 16 g 6   losartan (COZAAR) 100 MG tablet Take 1 tablet (100 mg total) by mouth daily. 90 tablet 0   meclizine (ANTIVERT) 25 MG tablet Take 1 tablet (25 mg total) by mouth 3 (three) times daily as needed for dizziness. 30 tablet 0   metoprolol succinate (TOPROL-XL) 50 MG 24 hr tablet Take 1 tablet (50 mg total) by mouth daily. TAKE WITH OR  IMMEDIATELY FOLLOWING A MEAL. 90 tablet 1   pantoprazole (PROTONIX) 40 MG tablet Take 1 tablet (40 mg total) by mouth daily. 90 tablet 1   Respiratory Therapy Supplies (CARETOUCH CPAP & BIPAP HOSE) MISC by Does not apply route.     rosuvastatin (CRESTOR) 10 MG tablet TAKE 1 TABLET BY MOUTH DAILY 90 tablet 0   XARELTO 20 MG TABS tablet TAKE 1 TABLET ONCE DAILY WITH SUPPER 90 tablet 0   No facility-administered medications prior to visit.    No Known Allergies   Social History   Tobacco Use   Smoking status:  Never   Smokeless tobacco: Never  Vaping Use   Vaping status: Never Used  Substance Use Topics   Alcohol use: Yes    Comment: occasional social   Drug use: Never     ROS   Labs/Other Tests and Data Reviewed:    Recent Labs: 06/18/2022: ALT 11; BUN 19; Creatinine, Ser 0.85; Hemoglobin 13.7; Platelets 282; Potassium 4.7; Sodium 138; TSH 3.110   Recent Lipid Panel Lab Results  Component Value Date/Time   CHOL 158 06/18/2022 08:21 AM   TRIG 121 06/18/2022 08:21 AM   HDL 56 06/18/2022 08:21 AM   CHOLHDL 2.8 06/18/2022 08:21 AM   LDLCALC 81 06/18/2022 08:21 AM    Wt Readings from Last 3 Encounters:  09/25/22 243 lb (110.2 kg)  09/21/22 243 lb (110.2 kg)  06/22/22 258 lb (117 kg)     Objective:    Vital Signs:  Temp 98.3 F (36.8 C)   Ht 5\' 2"  (1.575 m)   Wt 243 lb (110.2 kg)   BMI 44.45 kg/m    Physical Exam Constitutional:      Appearance: She is obese.  Pulmonary:     Effort: No respiratory distress.  Neurological:     Mental Status: She is alert.      ASSESSMENT & PLAN:   Upper respiratory tract infection due to COVID-19 virus Assessment & Plan: You should remain isolated and quarantine  until you are feeling better and are fever free without any fever reducers for at least 24 hours. You should wear a mask at all times after leaving isolation for the equivalent of 10 days from the onset of symptoms.  Your household contacts should be tested as well  as work contacts. If you feel worse or have increasing shortness of breath, you should be seen in person at urgent care or the emergency room.   Rx: paxlovid and hydromet cough syrup.  Hold xarelto and crestor while on paxlovid Recommend rest, fluids, 3 meals per day.  Fever reducing medicines.  OTC cold and congestion medicines.      Other orders -     HYDROcodone Bit-Homatrop MBr; Take 5 mLs by mouth every 6 (six) hours as needed for cough.  Dispense: 120 mL; Refill: 0 -     nirmatrelvir/ritonavir; Take 3 tablets by mouth 2 (two) times daily for 5 days. (Take nirmatrelvir 150 mg two tablets twice daily for 5 days and ritonavir 100 mg one tablet twice daily for 5 days) Patient GFR is 74  Dispense: 30 tablet; Refill: 0     No orders of the defined types were placed in this encounter.    Meds ordered this encounter  Medications   HYDROcodone bit-homatropine (HYDROMET) 5-1.5 MG/5ML syrup    Sig: Take 5 mLs by mouth every 6 (six) hours as needed for cough.    Dispense:  120 mL    Refill:  0   nirmatrelvir/ritonavir (PAXLOVID) 20 x 150 MG & 10 x 100MG  TABS    Sig: Take 3 tablets by mouth 2 (two) times daily for 5 days. (Take nirmatrelvir 150 mg two tablets twice daily for 5 days and ritonavir 100 mg one tablet twice daily for 5 days) Patient GFR is 74    Dispense:  30 tablet    Refill:  0    I spent 10 minutes dedicated to the care of this patient on the date of this encounter to include face-to-face time with the patient.  Follow Up:  In Person prn  SignedBlane Ohara, MD  09/25/2022 1:00 PM    [facility] [employer]

## 2022-09-25 NOTE — Assessment & Plan Note (Signed)
You should remain isolated and quarantine  until you are feeling better and are fever free without any fever reducers for at least 24 hours. You should wear a mask at all times after leaving isolation for the equivalent of 10 days from the onset of symptoms.  Your household contacts should be tested as well as work contacts. If you feel worse or have increasing shortness of breath, you should be seen in person at urgent care or the emergency room.   Rx: paxlovid and hydromet cough syrup.  Hold xarelto and crestor while on paxlovid Recommend rest, fluids, 3 meals per day.  Fever reducing medicines.  OTC cold and congestion medicines.

## 2022-09-25 NOTE — Patient Instructions (Signed)
You should remain isolated and quarantine  until you are feeling better and are fever free without any fever reducers for at least 24 hours. You should wear a mask at all times after leaving isolation for the equivalent of 10 days from the onset of symptoms.  Your household contacts should be tested as well as work contacts. If you feel worse or have increasing shortness of breath, you should be seen in person at urgent care or the emergency room.   Rx: paxlovid and hydromet cough syrup.  Hold xarelto and crestor while on paxlovid Recommend rest, fluids, 3 meals per day.  Fever reducing medicines.  OTC cold and congestion medicines.

## 2022-09-25 NOTE — Telephone Encounter (Signed)
Patient called with positive home COVID test this morning.  She is requesting Paxlovid as well as a liquid cough medication.  Symptoms are cough, fever, congestion, and diarrhea.  Her husband tested positive earlier in the week.

## 2022-09-28 ENCOUNTER — Ambulatory Visit: Payer: PPO | Admitting: Cardiology

## 2022-09-28 ENCOUNTER — Telehealth: Payer: Self-pay

## 2022-09-28 NOTE — Telephone Encounter (Signed)
Patient was seen on Friday. Dr. Sedalia Muta

## 2022-09-28 NOTE — Telephone Encounter (Signed)
Helen West called to report that she is feeling worse.  She feels anxious, not sleeping, diarrhea, cough and generally feels lousy.  She also is experiencing acid reflux.  Dr. Sedalia Muta advised that she stop the paxlovid.  Begin imodium, pepcid, mucinex and force fluids.  She was encouraged to call us back if symptoms do not improve.

## 2022-09-30 ENCOUNTER — Encounter (HOSPITAL_BASED_OUTPATIENT_CLINIC_OR_DEPARTMENT_OTHER): Payer: Self-pay

## 2022-09-30 ENCOUNTER — Telehealth: Payer: Self-pay | Admitting: Family Medicine

## 2022-09-30 ENCOUNTER — Emergency Department (HOSPITAL_BASED_OUTPATIENT_CLINIC_OR_DEPARTMENT_OTHER)
Admission: EM | Admit: 2022-09-30 | Discharge: 2022-09-30 | Disposition: A | Discharge status: Home / Self Care | Payer: PPO | Attending: Emergency Medicine | Admitting: Emergency Medicine

## 2022-09-30 ENCOUNTER — Other Ambulatory Visit: Payer: Self-pay

## 2022-09-30 DIAGNOSIS — Z8542 Personal history of malignant neoplasm of other parts of uterus: Secondary | ICD-10-CM | POA: Diagnosis not present

## 2022-09-30 DIAGNOSIS — N3000 Acute cystitis without hematuria: Secondary | ICD-10-CM | POA: Diagnosis not present

## 2022-09-30 DIAGNOSIS — R5383 Other fatigue: Secondary | ICD-10-CM | POA: Diagnosis not present

## 2022-09-30 DIAGNOSIS — R630 Anorexia: Secondary | ICD-10-CM | POA: Insufficient documentation

## 2022-09-30 DIAGNOSIS — Z7901 Long term (current) use of anticoagulants: Secondary | ICD-10-CM | POA: Diagnosis not present

## 2022-09-30 DIAGNOSIS — I4891 Unspecified atrial fibrillation: Secondary | ICD-10-CM | POA: Diagnosis not present

## 2022-09-30 DIAGNOSIS — M791 Myalgia, unspecified site: Secondary | ICD-10-CM | POA: Insufficient documentation

## 2022-09-30 DIAGNOSIS — R519 Headache, unspecified: Secondary | ICD-10-CM | POA: Insufficient documentation

## 2022-09-30 DIAGNOSIS — R110 Nausea: Secondary | ICD-10-CM | POA: Insufficient documentation

## 2022-09-30 LAB — COMPREHENSIVE METABOLIC PANEL
ALT: 13 U/L (ref 0–44)
AST: 17 U/L (ref 15–41)
Albumin: 4.1 g/dL (ref 3.5–5.0)
Alkaline Phosphatase: 71 U/L (ref 38–126)
Anion gap: 14 (ref 5–15)
BUN: 14 mg/dL (ref 8–23)
CO2: 29 mmol/L (ref 22–32)
Calcium: 9.2 mg/dL (ref 8.9–10.3)
Chloride: 84 mmol/L — ABNORMAL LOW (ref 98–111)
Creatinine, Ser: 0.84 mg/dL (ref 0.44–1.00)
GFR, Estimated: >60 mL/min (ref >60)
Glucose, Bld: 118 mg/dL — ABNORMAL HIGH (ref 70–99)
Potassium: 3.4 mmol/L — ABNORMAL LOW (ref 3.5–5.1)
Sodium: 127 mmol/L — ABNORMAL LOW (ref 135–145)
Total Bilirubin: 0.8 mg/dL (ref 0.3–1.2)
Total Protein: 7.8 g/dL (ref 6.5–8.1)

## 2022-09-30 LAB — URINALYSIS, ROUTINE W REFLEX MICROSCOPIC
Glucose, UA: NEGATIVE mg/dL
Hgb urine dipstick: NEGATIVE
Ketones, ur: 15 mg/dL — AB
Nitrite: NEGATIVE
Protein, ur: NEGATIVE mg/dL
Specific Gravity, Urine: 1.02 (ref 1.005–1.030)
pH: 5.5 (ref 5.0–8.0)

## 2022-09-30 LAB — CBC
HCT: 40.7 % (ref 36.0–46.0)
Hemoglobin: 14 g/dL (ref 12.0–15.0)
MCH: 30.4 pg (ref 26.0–34.0)
MCHC: 34.4 g/dL (ref 30.0–36.0)
MCV: 88.5 fL (ref 80.0–100.0)
Platelets: 326 10*3/uL (ref 150–400)
RBC: 4.6 MIL/uL (ref 3.87–5.11)
RDW: 12.9 % (ref 11.5–15.5)
WBC: 9.3 10*3/uL (ref 4.0–10.5)
nRBC: 0 % (ref 0.0–0.2)

## 2022-09-30 LAB — URINALYSIS, MICROSCOPIC (REFLEX)

## 2022-09-30 LAB — LIPASE, BLOOD: Lipase: 24 U/L (ref 11–51)

## 2022-09-30 MED ORDER — LACTATED RINGERS IV BOLUS
1000.0000 mL | Freq: Once | INTRAVENOUS | Status: AC
  Administered 2022-09-30: 1000 mL via INTRAVENOUS

## 2022-09-30 MED ORDER — ONDANSETRON HCL 4 MG/2ML IJ SOLN
4.0000 mg | Freq: Once | INTRAMUSCULAR | Status: AC
  Administered 2022-09-30: 4 mg via INTRAVENOUS
  Filled 2022-09-30: qty 2

## 2022-09-30 MED ORDER — PROCHLORPERAZINE MALEATE 10 MG PO TABS: 10.0000 mg | ORAL_TABLET | Freq: Two times a day (BID) | ORAL | 0 refills | Status: AC | PRN

## 2022-09-30 MED ORDER — KETOROLAC TROMETHAMINE 15 MG/ML IJ SOLN
15.0000 mg | Freq: Once | INTRAMUSCULAR | Status: AC
  Administered 2022-09-30: 15 mg via INTRAVENOUS
  Filled 2022-09-30: qty 1

## 2022-09-30 MED ORDER — CEPHALEXIN 500 MG PO CAPS: 500.0000 mg | ORAL_CAPSULE | Freq: Two times a day (BID) | ORAL | 0 refills | Status: AC

## 2022-09-30 NOTE — ED Provider Notes (Signed)
[place] [facility] Provider Note   CSN: [id number] Arrival date & time: 09/30/22  1355     History  Chief Complaint  Patient presents with   Fatigue    Helen West is a 67 y.o. female with history of HLD, afib on xarelto, arthritis, endometrial cancer, who presents to the ER complaining of fatigue, nausea, decreased appetite, headache and body aches. Was diagnosed with COVID-19 last week and prescribed paxlovid on 8/23. States she stopped taking it as she could not tolerate it due to anxiety, insomnia, and diarrhea. She also was prescribed antibiotics last week by her dentist. States that now she only has to see food before she feels the need to vomit. Having difficulty sleeping, difficulty keeping down fluids. Diarrhea has mostly resolved, but still has fecal urgency anytime she uses the restroom.   HPI     Home Medications Prior to Admission medications   Medication Sig Start Date End Date Taking? Authorizing Provider  cephALEXin (KEFLEX) 500 MG capsule Take 1 capsule (500 mg total) by mouth 2 (two) times daily for 7 days. 09/30/22 10/07/22 Yes Xochil Shanker T, PA-C  prochlorperazine (COMPAZINE) 10 MG tablet Take 1 tablet (10 mg total) by mouth 2 (two) times daily as needed for nausea or vomiting. 09/30/22  Yes Tiffaney Heimann T, PA-C  acetaminophen (TYLENOL) 650 MG CR tablet Take 1,950 mg by mouth in the morning and at bedtime.    [provider]  albuterol (VENTOLIN HFA) 108 (90 Base) MCG/ACT inhaler Inhale 2 puffs into the lungs every 6 (six) hours as needed for wheezing or shortness of breath. 06/18/22   [name], Fritzi Mandes, MD  chlorthalidone (HYGROTON) 25 MG tablet Take 1 tablet (25 mg total) by mouth daily. 06/18/22   [name], Fritzi Mandes, MD  fluticasone (FLONASE) 50 MCG/ACT nasal spray Place 2 sprays into both nostrils daily. 06/18/22   [name], Fritzi Mandes, MD  HYDROcodone bit-homatropine (HYDROMET) 5-1.5 MG/5ML syrup Take 5 mLs by mouth every 6  (six) hours as needed for cough. 09/25/22   CoxFritzi Mandes, MD  losartan (COZAAR) 100 MG tablet Take 1 tablet (100 mg total) by mouth daily. 06/18/22   CoxFritzi Mandes, MD  meclizine (ANTIVERT) 25 MG tablet Take 1 tablet (25 mg total) by mouth 3 (three) times daily as needed for dizziness. 12/25/20   Janie Morning, NP  metoprolol succinate (TOPROL-XL) 50 MG 24 hr tablet Take 1 tablet (50 mg total) by mouth daily. TAKE WITH OR IMMEDIATELY FOLLOWING A MEAL. 12/15/21   CoxFritzi Mandes, MD  nirmatrelvir/ritonavir (PAXLOVID) 20 x 150 MG & 10 x 100MG  TABS Take 3 tablets by mouth 2 (two) times daily for 5 days. (Take nirmatrelvir 150 mg two tablets twice daily for 5 days and ritonavir 100 mg one tablet twice daily for 5 days) Patient GFR is 74 09/25/22 09/30/22  [name], Kirsten, MD  pantoprazole (PROTONIX) 40 MG tablet Take 1 tablet (40 mg total) by mouth daily. 06/18/22   CoxFritzi Mandes, MD  Respiratory Therapy Supplies (CARETOUCH CPAP & BIPAP HOSE) MISC by Does not apply route.    [provider]  rosuvastatin (CRESTOR) 10 MG tablet TAKE 1 TABLET BY MOUTH DAILY 08/25/22   [name], Kirsten, MD  XARELTO 20 MG TABS tablet TAKE 1 TABLET ONCE DAILY WITH SUPPER 08/12/22   Camnitz, Andree Coss, MD      Allergies    Patient has no known allergies.    Review of Systems   Review of Systems  Constitutional:  Positive for appetite change and fatigue.  Respiratory:  Positive for cough.   Gastrointestinal:  Positive for nausea.  All other systems reviewed and are negative.   Physical Exam Updated Vital Signs BP 139/62   Pulse 69   Temp 98.2 F (36.8 C) (Oral)   Resp 18   Ht 5\' 3"  (1.6 m)   Wt 108.9 kg   SpO2 99%   BMI 42.51 kg/m  Physical Exam Vitals and nursing note reviewed.  Constitutional:      Appearance: Normal appearance.  HENT:     Head: Normocephalic and atraumatic.  Eyes:     Conjunctiva/sclera: Conjunctivae normal.  Cardiovascular:     Rate and Rhythm: Normal rate and regular rhythm.   Pulmonary:     Effort: Pulmonary effort is normal. No respiratory distress.     Breath sounds: Normal breath sounds.  Abdominal:     General: There is no distension.     Palpations: Abdomen is soft.     Tenderness: There is no abdominal tenderness.  Skin:    General: Skin is warm and dry.  Neurological:     General: No focal deficit present.     Mental Status: She is alert.     ED Results / Procedures / Treatments   Labs (all labs ordered are listed, but only abnormal results are displayed) Labs Reviewed  COMPREHENSIVE METABOLIC PANEL - Abnormal; Notable for the following components:      Result Value   Sodium 127 (*)    Potassium 3.4 (*)    Chloride 84 (*)    Glucose, Bld 118 (*)    All other components within normal limits  URINALYSIS, ROUTINE W REFLEX MICROSCOPIC - Abnormal; Notable for the following components:   Bilirubin Urine SMALL (*)    Ketones, ur 15 (*)    Leukocytes,Ua SMALL (*)    All other components within normal limits  URINALYSIS, MICROSCOPIC (REFLEX) - Abnormal; Notable for the following components:   Bacteria, UA MANY (*)    All other components within normal limits  LIPASE, BLOOD  CBC    EKG None  Radiology No results found.  Procedures Procedures    Medications Ordered in ED Medications  lactated ringers bolus 1,000 mL (0 mLs Intravenous Stopped 09/30/22 1729)  ketorolac (TORADOL) 15 MG/ML injection 15 mg (15 mg Intravenous Given 09/30/22 1542)  ondansetron (ZOFRAN) injection 4 mg (4 mg Intravenous Given 09/30/22 1548)    ED Course/ Medical Decision Making/ A&P                                 Medical Decision Making Amount and/or Complexity of Data Reviewed Labs: ordered.  Risk Prescription drug management.   This patient is a 67 y.o. female  who presents to the ED for concern of fatigue, nausea.   Differential diagnoses prior to evaluation: The emergent differential diagnosis includes, but is not limited to,  ACS/MI, DKA,  sepsis, drug-related, viral or post viral illness, appendicitis, cholecystitis, gastroenteritis, UTI. This is not an exhaustive differential.   Past Medical History / Co-morbidities / Social History: HLD, afib on xarelto, arthritis, endometrial cancer  Additional history: Chart reviewed. Pertinent results include: Reviewed telephone encounters with PCP.  Patient initially called on 8/23 describing her symptoms and positive at-home COVID test, she was prescribed Paxlovid.  Called in on 8/26 discussing the symptoms she was experiencing.  They recommended that she stop the Paxlovid.  Called  again today and they recommended that she come to the ER.  Physical Exam: Physical exam performed. The pertinent findings include: Patient hypertensive, otherwise normal vital signs.  No acute distress.  Abdomen soft and nontender.  Heart regular rate and rhythm, lungs clear.  Lab Tests/Imaging studies: I personally interpreted labs/imaging and the pertinent results include: No leukocytosis, normal hemoglobin.  Sodium 127, potassium 3.4, chloride 84, normal kidney function.  Normal lipase.  Urinalysis with bilirubin, ketones, small leukocytes, and many bacteria.    Medications: I ordered medication including IV fluids, Toradol, Zofran.  I have reviewed the patients home medicines and have made adjustments as needed.  Cardiac monitoring: EKG obtained and interpreted by myself and attending physician which shows: Sinus rhythm, voltage, prolonged QTc   Disposition: After consideration of the diagnostic results and the patients response to treatment, I feel that emergency department workup does not suggest an emergent condition requiring admission or immediate intervention beyond what has been performed at this time. The plan is: Discharge to home with symptomatic management of fatigue and nausea, possibly postviral from COVID infection.  Patient also with urinalysis concerning for UTI.  While patient does not have  urinary symptoms, she states she has never had a UTI before.  No remarkable significant relief to her symptoms, will prescribe antibiotics as well as nausea medication.  Encouraged her to follow-up with her PCP.  The patient is safe for discharge and has been instructed to return immediately for worsening symptoms, change in symptoms or any other concerns.  Final Clinical Impression(s) / ED Diagnoses Final diagnoses:  Fatigue, unspecified type  Nausea  Acute cystitis without hematuria    Rx / DC Orders ED Discharge Orders          Ordered    cephALEXin (KEFLEX) 500 MG capsule  2 times daily        09/30/22 1816    prochlorperazine (COMPAZINE) 10 MG tablet  2 times daily PRN        09/30/22 1816           Portions of this report may have been transcribed using voice recognition software. Every effort was made to ensure accuracy; however, inadvertent computerized transcription errors may be present.    Jeanella Flattery 09/30/22 Maryjo Rochester, MD 10/02/22 [phone]

## 2022-09-30 NOTE — Discharge Instructions (Signed)
You are seen emergency department today for fatigue and nausea.  As we discussed your blood work was overall reassuring.  We gave you some IV fluids, anti-inflammatory pain medicine, and nausea medication.  Glad that you are feeling much better after this.  I suspect that your symptoms could be related to your recent COVID infection or the medications that you are on.  Your urine sample was also concerning for urinary tract infection.  This could have been contributing to your symptoms as well.  I have sent a prescription for some antibiotics to your pharmacy, as well as a nausea medication that you can take as needed.  Make sure that you are staying well-hydrated as best as possible.  You start with bland foods, and advance your diet as tolerated.  Continue to monitor how you're doing and return to the ER for new or worsening symptoms.

## 2022-09-30 NOTE — ED Triage Notes (Signed)
Pt reports that she had covid last week and was prescribed Paxlovid. Stopped taking it last week. Went to see Dentist and was giving ABT for teeth work. States that she sees food and wants to vomit. States she can't sleep, not drinking anything, and body aches.

## 2022-09-30 NOTE — Telephone Encounter (Signed)
Patient was diagnosed with COVID last Friday.  She is giving Paxlovid but did not tolerate it.  She called today and she is unable to eat or drink.  She is having a horrible headache.  She is currently on her way to the emergency department and wanted to let us know.  I have asked her to give [place] a call tomorrow and let us know how she is doing.  I directed her to the med center in [facility] which is actually where she was going.

## 2022-10-01 DIAGNOSIS — M1712 Unilateral primary osteoarthritis, left knee: Secondary | ICD-10-CM | POA: Diagnosis not present

## 2022-10-07 ENCOUNTER — Telehealth: Payer: Self-pay

## 2022-10-07 ENCOUNTER — Ambulatory Visit: Payer: PPO | Admitting: Gastroenterology

## 2022-10-07 ENCOUNTER — Encounter: Payer: Self-pay | Admitting: Gastroenterology

## 2022-10-07 VITALS — BP 120/72 | HR 64 | Ht 63.0 in | Wt 240.2 lb

## 2022-10-07 DIAGNOSIS — K621 Rectal polyp: Secondary | ICD-10-CM

## 2022-10-07 DIAGNOSIS — Z86010 Personal history of colonic polyps: Secondary | ICD-10-CM | POA: Diagnosis not present

## 2022-10-07 DIAGNOSIS — I480 Paroxysmal atrial fibrillation: Secondary | ICD-10-CM

## 2022-10-07 MED ORDER — NA SULFATE-K SULFATE-MG SULF 17.5-3.13-1.6 GM/177ML PO SOLN: 1.0000 | Freq: Once | ORAL | 0 refills | Status: AC

## 2022-10-07 NOTE — Progress Notes (Signed)
HPI : Helen West is a 67 y.o. female with a history of atrial fibrillation, enodmetrial cancer and arthritis who is referred to [place] by Blane Ohara, MD for surveillance colonoscopy.  Her previous colonoscopy was in 2018 in [place], and 2 small polyps were removed, a 3 mm tubular adenoma and a rectal hyperplastic polyp.  She was recommended to repeat colonoscopy in 5 years.  She has no family history of colon cancer. She has no chronic GI symptoms other than occasional bouts of constipation.  She denies abdominal pain, diarrhea or blood in the stool. She has lost 20lbs recently through improvements in her diet.  She is looking to get down to 220lbs so that she will be a candidate for a knee replacment.  She has atrial fibrillation which is treated with lopressor and Xarelto.  She is s/p cardiac ablation in 2022.  She has symptoms of palpitations when she is in atrial fibrillation.  She denies having any such symptoms since her ablation except for a brief period last week.  She took her vitals and her HR was 115 with a BP of 140/111.  She took an extra dose of metoprolol and her symptoms resolved.  She has not had any recurrence of these symptoms since then.  She has an appointment with her cardiologist next month.   Colonoscopy Aug 2018 [place] (Dr. 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   Complication of anesthesia    Loss of vocal/voice x 2 months   Dysrhythmia    Endometrial cancer (HCC) 11/02/2017   Headache    High cholesterol    History of radiation therapy 01/12/2018   11/14, 11/21, 11/27, 12/4, 01/12/18:  Vaginal cuff, 6 Gy in 5 fractions for a total dose of 30 Gy  Dr Antony Blackbird   Malignant neoplasm of overlapping sites of body of uterus (HCC) 12/29/2017   xrt 11/19   Obesity    Post-menopausal bleeding      Past Surgical  History:  Procedure Laterality Date   ATRIAL FIBRILLATION ABLATION N/A 05/15/2020   Procedure: ATRIAL FIBRILLATION ABLATION;  Surgeon: Regan Lemming, MD;  [facility];  Service: Cardiovascular;  Laterality: N/A;   ROBOTIC ASSISTED TOTAL HYSTERECTOMY WITH BILATERAL SALPINGO OOPHERECTOMY Bilateral 11/02/2017   Procedure: XI ROBOTIC ASSISTED TOTAL HYSTERECTOMY WITH BILATERAL SALPINGO OOPHORECTOMY;  Surgeon: Adolphus Birchwood, MD;  Location: [facility];  Service: Gynecology;  Laterality: Bilateral;   SENTINEL NODE BIOPSY N/A 11/02/2017   Procedure: SENTINEL NODE BIOPSY;  Surgeon: Adolphus Birchwood, MD;  Location: [facility];  Service: Gynecology;  Laterality: N/A;   TONSILLECTOMY     TOTAL HIP ARTHROPLASTY     TUBAL LIGATION     Family History  Problem Relation Age of Onset   Aneurysm Mother    Emphysema Father    Cancer Paternal Uncle    Breast cancer Neg Hx    Social History   Tobacco Use   Smoking status: Never   Smokeless tobacco: Never  Vaping Use   Vaping status: Never Used  Substance Use Topics   Alcohol use: Not Currently    Comment: occasional social   Drug use: Never   Current Outpatient Medications  Medication Sig Dispense Refill   acetaminophen (TYLENOL) 650 MG CR tablet Take 1,950 mg by mouth in  the morning and at bedtime.     albuterol (VENTOLIN HFA) 108 (90 Base) MCG/ACT inhaler Inhale 2 puffs into the lungs every 6 (six) hours as needed for wheezing or shortness of breath. 8 g 2   cephALEXin (KEFLEX) 500 MG capsule Take 1 capsule (500 mg total) by mouth 2 (two) times daily for 7 days. 14 capsule 0   chlorthalidone (HYGROTON) 25 MG tablet Take 1 tablet (25 mg total) by mouth daily. 90 tablet 0   fluticasone (FLONASE) 50 MCG/ACT nasal spray Place 2 sprays into both nostrils daily. 16 g 6   HYDROcodone bit-homatropine (HYDROMET) 5-1.5 MG/5ML syrup Take 5 mLs by mouth every 6 (six) hours as needed for cough. 120 mL 0   losartan (COZAAR) 100 MG tablet Take 1 tablet (100  mg total) by mouth daily. 90 tablet 0   meclizine (ANTIVERT) 25 MG tablet Take 1 tablet (25 mg total) by mouth 3 (three) times daily as needed for dizziness. 30 tablet 0   metoprolol succinate (TOPROL-XL) 50 MG 24 hr tablet Take 1 tablet (50 mg total) by mouth daily. TAKE WITH OR IMMEDIATELY FOLLOWING A MEAL. 90 tablet 1   pantoprazole (PROTONIX) 40 MG tablet Take 1 tablet (40 mg total) by mouth daily. 90 tablet 1   prochlorperazine (COMPAZINE) 10 MG tablet Take 1 tablet (10 mg total) by mouth 2 (two) times daily as needed for nausea or vomiting. 10 tablet 0   Respiratory Therapy Supplies (CARETOUCH CPAP & BIPAP HOSE) MISC by Does not apply route.     rosuvastatin (CRESTOR) 10 MG tablet TAKE 1 TABLET BY MOUTH DAILY 90 tablet 0   XARELTO 20 MG TABS tablet TAKE 1 TABLET ONCE DAILY WITH SUPPER 90 tablet 0   No current facility-administered medications for this visit.   No Known Allergies   Review of Systems: All systems reviewed and negative except where noted in HPI.    No results found.  Physical Exam: BP 120/72   Pulse 64   Ht 5\' 3"  (1.6 m)   Wt 240 lb 3.2 oz (109 kg)   BMI 42.55 kg/m  Constitutional: Pleasant,well-developed, Caucasian female in no acute distress. HEENT: Normocephalic and atraumatic. Conjunctivae are normal. No scleral icterus. Neck supple.  Cardiovascular: Normal rate, regular rhythm.  Pulmonary/chest: Effort normal and breath sounds normal. No wheezing, rales or rhonchi. Abdominal: Soft, nondistended, nontender. Bowel sounds active throughout. There are no masses palpable. No hepatomegaly. Extremities: no edema Neurological: Alert and oriented to person place and time. Skin: Skin is warm and dry. No rashes noted. Psychiatric: Normal mood and affect. Behavior is normal.  CBC    Component Value Date/Time   WBC 9.3 09/30/2022 1423   RBC 4.60 09/30/2022 1423   HGB 14.0 09/30/2022 1423   HGB 13.7 06/18/2022 0821   HCT 40.7 09/30/2022 1423   HCT 43.0  06/18/2022 0821   PLT 326 09/30/2022 1423   PLT 282 06/18/2022 0821   MCV 88.5 09/30/2022 1423   MCV 95 06/18/2022 0821   MCH 30.4 09/30/2022 1423   MCHC 34.4 09/30/2022 1423   RDW 12.9 09/30/2022 1423   RDW 12.8 06/18/2022 0821   LYMPHSABS 1.4 06/18/2022 0821   EOSABS 0.2 06/18/2022 0821   BASOSABS 0.0 06/18/2022 0821    CMP     Component Value Date/Time   NA 127 (L) 09/30/2022 1423   NA 138 06/18/2022 0821   K 3.4 (L) 09/30/2022 1423   CL 84 (L) 09/30/2022 1423   CO2 29 09/30/2022  1423   GLUCOSE 118 (H) 09/30/2022 1423   BUN 14 09/30/2022 1423   BUN 19 06/18/2022 0821   CREATININE 0.84 09/30/2022 1423   CALCIUM 9.2 09/30/2022 1423   PROT 7.8 09/30/2022 1423   PROT 6.8 06/18/2022 0821   ALBUMIN 4.1 09/30/2022 1423   ALBUMIN 4.4 06/18/2022 0821   AST 17 09/30/2022 1423   ALT 13 09/30/2022 1423   ALKPHOS 71 09/30/2022 1423   BILITOT 0.8 09/30/2022 1423   BILITOT 0.4 06/18/2022 0821   GFRNONAA >60 09/30/2022 1423   GFRAA 72 02/15/2020 1331       Latest Ref Rng & Units 09/30/2022    2:23 PM 06/18/2022    8:21 AM 12/12/2021    7:49 AM  CBC EXTENDED  WBC 4.0 - 10.5 K/uL 9.3  4.8  5.0   RBC 3.87 - 5.11 MIL/uL 4.60  4.54  4.53   Hemoglobin 12.0 - 15.0 g/dL 84.1  66.0  63.0   HCT 36.0 - 46.0 % 40.7  43.0  42.7   Platelets 150 - 400 K/uL 326  282  279   NEUT# 1.4 - 7.0 x10E3/uL  2.8    Lymph# 0.7 - 3.1 x10E3/uL  1.4        ASSESSMENT AND PLAN: 67 year old female with atrial fibrillation and history of colon polyps here to discuss surveillance colonoscopy.  We discussed how guidelines for polyp surveillance have changed since her last colonoscopy, and how current guidelines would suggest a repeat colonoscopy in 7-10 years, rather than 5 years.  I did offer to adhere to the previous endoscopist recommendations as well, if the patient desired.  The patient stated she would prefer to do the colonoscopy now.  As she just recently had symptoms suggestive of recurrent a-fib  which she has not had in several years, I recommended she discuss with her cardiologist whether there are any concerns about her proceeding with an elective procedure.  As her symptoms were fleeting, I don't think this is a contraindication, but if she has recurrent symptoms, we may need to consider delaying her colonoscopy if further cardiac evaluation is warranted.  Will send letter to cardiology requesting clearance to hold Xarelto for 2 days.  History of colon polyps - Colonoscopy - Hold Xarelto x 2 days  Atrial fibrillation s/p ablation - Follow up with cardiology next month as scheduled  The details, risks (including bleeding, perforation, infection, missed lesions, medication reactions and possible hospitalization or surgery if complications occur), benefits, and alternatives to colonoscopy with possible biopsy and possible polypectomy were discussed with the patient and she consents to proceed.    Blane Ohara, MD

## 2022-10-07 NOTE — Telephone Encounter (Signed)
[facility] Pre-operative Risk Assessment     Request for surgical clearance:     Endoscopy Procedure  What type of surgery is being performed?     Colonoscopy   When is this surgery scheduled?     11/17/22  What type of clearance is required ?   Pharmacy  Are there any medications that need to be held prior to surgery and how long? Xarelto 2 days  Practice name and name of physician performing surgery?      [facility]  What is your office phone and fax number?      Phone- [phone]  Fax- [phone]  Anesthesia type (None, local, MAC, general) ?       MAC

## 2022-10-07 NOTE — Patient Instructions (Signed)
You have been scheduled for a colonoscopy. Please follow written instructions given to you at your visit today.   Please pick up your prep supplies at the pharmacy within the next 1-3 days.  If you use inhalers (even only as needed), please bring them with you on the day of your procedure.  DO NOT TAKE 7 DAYS PRIOR TO TEST- Trulicity (dulaglutide) Ozempic, Wegovy (semaglutide) Mounjaro (tirzepatide) Bydureon Bcise (exanatide extended release)  DO NOT TAKE 1 DAY PRIOR TO YOUR TEST Rybelsus (semaglutide) Adlyxin (lixisenatide) Victoza (liraglutide) Byetta (exanatide) ___________________________________________________________________________   If your blood pressure at your visit was 140/90 or greater, please contact your primary care physician to follow up on this.  _______________________________________________________  If you are age 17 or older, your body mass index should be between 23-30. Your Body mass index is 42.55 kg/m. If this is out of the aforementioned range listed, please consider follow up with your Primary Care Provider.  If you are age 10 or younger, your body mass index should be between 19-25. Your Body mass index is 42.55 kg/m. If this is out of the aformentioned range listed, please consider follow up with your Primary Care Provider.   ________________________________________________________  The [facility] providers would like to encourage you to use [facility] to communicate with providers for non-urgent requests or questions.  Due to long hold times on the telephone, sending your provider a message by [facility] may be a faster and more efficient way to get a response.  Please allow 48 business hours for a response.  Please remember that this is for non-urgent requests.   It was a pleasure to see you today!  Thank you for trusting me with your gastrointestinal care!    Scott E.Tomasa Rand, MD

## 2022-10-07 NOTE — Telephone Encounter (Signed)
   Patient Name: Helen West  DOB: Aug 25, 1955 MRN: [id number]  Primary Cardiologist: None  Clinical pharmacists have reviewed the patient's past medical history, labs, and current medications as part of preoperative protocol coverage. The following recommendations have been made:  Patient with diagnosis of afib on Xarelto for anticoagulation.     Procedure: colonoscopy Date of procedure: 11/17/22   CHA2DS2-VASc Score = 3  This indicates a 3.2% annual risk of stroke. The patient's score is based upon: CHF History: 0 HTN History: 1 Diabetes History: 0 Stroke History: 0 Vascular Disease History: 0 Age Score: 1 Gender Score: 1   CrCl 54mL/min using adjusted body weight Platelet count 326K   Per office protocol, patient can hold Xarelto for 2 days prior to procedure as requested. Please resume Xarelto as soon as possible postprocedure, at the discretion of the surgeon.    I will route this recommendation to the requesting party via Epic fax function and remove from pre-op pool.  Please call with questions.  Joylene Grapes, NP 10/07/2022, 12:43 PM

## 2022-10-07 NOTE — Telephone Encounter (Signed)
Patient with diagnosis of afib on Xarelto for anticoagulation.    Procedure: colonoscopy Date of procedure: 11/17/22  CHA2DS2-VASc Score = 3  This indicates a 3.2% annual risk of stroke. The patient's score is based upon: CHF History: 0 HTN History: 1 Diabetes History: 0 Stroke History: 0 Vascular Disease History: 0 Age Score: 1 Gender Score: 1   CrCl 92mL/min using adjusted body weight Platelet count 326K  Per office protocol, patient can hold Xarelto for 2 days prior to procedure as requested.    **This guidance is not considered finalized until pre-operative APP has relayed final recommendations.**

## 2022-10-11 ENCOUNTER — Encounter: Payer: Self-pay | Admitting: Family Medicine

## 2022-10-11 NOTE — Assessment & Plan Note (Signed)
Healthy diet.  Continue crestor 10 mg daily.

## 2022-10-11 NOTE — Assessment & Plan Note (Signed)
Continue protonix 40 mg twice daily.

## 2022-10-11 NOTE — Progress Notes (Unsigned)
Subjective:  Patient ID: Helen West, female    DOB: February 08, 1955  Age: 67 y.o. MRN: [id number]  Chief Complaint  Patient presents with   Medical Management of Chronic Issues    HPI   Atrial fibrillation on xarelto 20 mg daily and metoprolol succinate 50 mg once daily.   OSA: She is on CPAP 20 pressure.  Hyperlipidemia: Current medications: Crestor 10 mg daily.  Hypertension: Current medications: Chlorthalidone 25 mg daily, Losartan 100 mg daily, Metoprolol succinate 50 mg daily  Diet: Healthy. Fruits and vegetable. Eats steak, Eats fish.   Exercise:   GERD-Protonix 40 mg twice daily     06/22/2022    1:30 PM 06/18/2022    8:00 AM 05/26/2022    9:08 AM 12/09/2021   10:26 AM 11/29/2020    7:54 AM  Depression screen PHQ 2/9  Decreased Interest 0 0 0 0 0  Down, Depressed, Hopeless 0 0 0 0 0  PHQ - 2 Score 0 0 0 0 0  Altered sleeping 0      Tired, decreased energy 0      Change in appetite 0      Feeling bad or failure about yourself  0      Trouble concentrating 0      Moving slowly or fidgety/restless 0      Suicidal thoughts 0      PHQ-9 Score 0      Difficult doing work/chores Not difficult at all            06/22/2022    1:29 PM  Fall Risk   Falls in the past year? 0  Number falls in past yr: 0  Injury with Fall? 0  Risk for fall due to : Impaired mobility  Follow up Falls evaluation completed;Falls prevention discussed    Patient Care Team: Blane Ohara, MD as PCP - General (Family Medicine) Regan Lemming, MD as PCP - Electrophysiology (Cardiology) Quintella Reichert, MD as PCP - Sleep Medicine (Cardiology) Earvin Hansen, [facility] (Inactive) as Pharmacist (Pharmacist) Earvin Hansen, [facility] (Inactive) as Pharmacist (Pharmacist) Antony Blackbird, MD as Consulting Physician (Radiation Oncology)   Review of Systems  Current Outpatient Medications on File Prior to Visit  Medication Sig Dispense Refill   acetaminophen (TYLENOL) 650 MG CR tablet Take 1,950 mg  by mouth in the morning and at bedtime.     albuterol (VENTOLIN HFA) 108 (90 Base) MCG/ACT inhaler Inhale 2 puffs into the lungs every 6 (six) hours as needed for wheezing or shortness of breath. 8 g 2   chlorthalidone (HYGROTON) 25 MG tablet Take 1 tablet (25 mg total) by mouth daily. 90 tablet 0   fluticasone (FLONASE) 50 MCG/ACT nasal spray Place 2 sprays into both nostrils daily. 16 g 6   HYDROcodone bit-homatropine (HYDROMET) 5-1.5 MG/5ML syrup Take 5 mLs by mouth every 6 (six) hours as needed for cough. 120 mL 0   losartan (COZAAR) 100 MG tablet Take 1 tablet (100 mg total) by mouth daily. 90 tablet 0   meclizine (ANTIVERT) 25 MG tablet Take 1 tablet (25 mg total) by mouth 3 (three) times daily as needed for dizziness. 30 tablet 0   metoprolol succinate (TOPROL-XL) 50 MG 24 hr tablet Take 1 tablet (50 mg total) by mouth daily. TAKE WITH OR IMMEDIATELY FOLLOWING A MEAL. 90 tablet 1   pantoprazole (PROTONIX) 40 MG tablet Take 1 tablet (40 mg total) by mouth daily. 90 tablet 1   prochlorperazine (COMPAZINE) 10  MG tablet Take 1 tablet (10 mg total) by mouth 2 (two) times daily as needed for nausea or vomiting. 10 tablet 0   Respiratory Therapy Supplies (CARETOUCH CPAP & BIPAP HOSE) MISC by Does not apply route.     rosuvastatin (CRESTOR) 10 MG tablet TAKE 1 TABLET BY MOUTH DAILY 90 tablet 0   XARELTO 20 MG TABS tablet TAKE 1 TABLET ONCE DAILY WITH SUPPER 90 tablet 0   No current facility-administered medications on file prior to visit.   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   Complication of anesthesia    Loss of vocal/voice x 2 months   Dysrhythmia    Endometrial cancer (HCC) 11/02/2017   Headache    High cholesterol    History of radiation therapy 01/12/2018   11/14, 11/21, 11/27, 12/4, 01/12/18:  Vaginal cuff, 6 Gy in 5 fractions for a total dose of 30 Gy  Dr Antony Blackbird    Malignant neoplasm of overlapping sites of body of uterus (HCC) 12/29/2017   xrt 11/19   Obesity    Post-menopausal bleeding    Upper respiratory tract infection due to COVID-19 virus 02/24/2021   Past Surgical History:  Procedure Laterality Date   ATRIAL FIBRILLATION ABLATION N/A 05/15/2020   Procedure: ATRIAL FIBRILLATION ABLATION;  Surgeon: Regan Lemming, MD;  [facility]: [facility];  Service: Cardiovascular;  Laterality: N/A;   ROBOTIC ASSISTED TOTAL HYSTERECTOMY WITH BILATERAL SALPINGO OOPHERECTOMY Bilateral 11/02/2017   Procedure: XI ROBOTIC ASSISTED TOTAL HYSTERECTOMY WITH BILATERAL SALPINGO OOPHORECTOMY;  Surgeon: Adolphus Birchwood, MD;  Location: [facility];  Service: Gynecology;  Laterality: Bilateral;   SENTINEL NODE BIOPSY N/A 11/02/2017   Procedure: SENTINEL NODE BIOPSY;  Surgeon: Adolphus Birchwood, MD;  Location: [facility];  Service: Gynecology;  Laterality: N/A;   TONSILLECTOMY     TOTAL HIP ARTHROPLASTY     TUBAL LIGATION      Family History  Problem Relation Age of Onset   Aneurysm Mother    Emphysema Father    Cancer Paternal Uncle    Breast cancer Neg Hx    Social History   Socioeconomic History   Marital status: Married    Spouse name: Lennox Grumbles   Number of children: 2   Years of education: Not on file   Highest education level: Not on file  Occupational History   Not on file  Tobacco Use   Smoking status: Never   Smokeless tobacco: Never  Vaping Use   Vaping status: Never Used  Substance and Sexual Activity   Alcohol use: Not Currently    Comment: occasional social   Drug use: Never   Sexual activity: Not on file  Other Topics Concern   Not on file  Social History Narrative   2 children from previous marriage, 2 step children and many grandchildren    Social Determinants of Health   Financial Resource Strain: Low Risk  (12/09/2021)   Overall Financial Resource Strain (CARDIA)    Difficulty of Paying Living Expenses: Not hard at all  Food Insecurity: No  Food Insecurity (12/09/2021)   Hunger Vital Sign    Worried About Running Out of Food in the Last Year: Never true    Ran Out of Food in the Last Year: Never true  Transportation Needs: No Transportation Needs (12/09/2021)   PRAPARE - Transportation    Lack  of Transportation (Medical): No    Lack of Transportation (Non-Medical): No  Physical Activity: Inactive (12/09/2021)   Exercise Vital Sign    Days of Exercise per Week: 0 days    Minutes of Exercise per Session: 0 min  Stress: No Stress Concern Present (12/09/2021)   [employer] of Occupational Health - Occupational Stress Questionnaire    Feeling of Stress : Not at all  Social Connections: Moderately Integrated (05/26/2022)   Social Connection and Isolation Panel [NHANES]    Frequency of Communication with Friends and Family: More than three times a week    Frequency of Social Gatherings with Friends and Family: Twice a week    Attends Religious Services: Never    Database administrator or Organizations: No    Attends Engineer, structural: More than 4 times per year    Marital Status: Married    Objective:  There were no vitals taken for this visit.     10/07/2022    9:59 AM 09/30/2022    5:15 PM 09/30/2022    3:45 PM  BP/Weight  Systolic BP 120 139 148  Diastolic BP 72 62 66  Wt. (Lbs) 240.2    BMI 42.55 kg/m2      Physical Exam  Diabetic Foot Exam - Simple   No data filed      Lab Results  Component Value Date   WBC 9.3 09/30/2022   HGB 14.0 09/30/2022   HCT 40.7 09/30/2022   PLT 326 09/30/2022   GLUCOSE 118 (H) 09/30/2022   CHOL 158 06/18/2022   TRIG 121 06/18/2022   HDL 56 06/18/2022   LDLCALC 81 06/18/2022   ALT 13 09/30/2022   AST 17 09/30/2022   NA 127 (L) 09/30/2022   K 3.4 (L) 09/30/2022   CL 84 (L) 09/30/2022   CREATININE 0.84 09/30/2022   BUN 14 09/30/2022   CO2 29 09/30/2022   TSH 3.110 06/18/2022   HGBA1C 6.1 (H) 06/18/2022      Assessment & Plan:    Primary  hypertension Assessment & Plan: Continue chlorthalidone 25 mg daily, losartan 100 mg daily and metoprolol succinate daily. Healthy diet and exercise.      Mixed hyperlipidemia Assessment & Plan: Healthy diet.  Continue crestor 10 mg daily.     Gastroesophageal reflux disease without esophagitis Assessment & Plan: Continue protonix 40 mg daily.        No orders of the defined types were placed in this encounter.   No orders of the defined types were placed in this encounter.    Follow-up: No follow-ups on file.   I,Marla I Leal-Borjas,acting as a scribe for Blane Ohara, MD.,have documented all relevant documentation on the behalf of Blane Ohara, MD,as directed by  Blane Ohara, MD while in the presence of Blane Ohara, MD.   An After Visit Summary was printed and given to the patient.  Blane Ohara, MD [facility] [phone]

## 2022-10-11 NOTE — Assessment & Plan Note (Signed)
Continue chlorthalidone 25 mg daily, losartan 100 mg daily and metoprolol succinate daily. Healthy diet and exercise.

## 2022-10-12 ENCOUNTER — Ambulatory Visit (INDEPENDENT_AMBULATORY_CARE_PROVIDER_SITE_OTHER): Payer: PPO | Admitting: Family Medicine

## 2022-10-12 ENCOUNTER — Encounter: Payer: Self-pay | Admitting: Family Medicine

## 2022-10-12 VITALS — BP 124/68 | HR 60 | Temp 96.5°F | Resp 18 | Ht 63.0 in | Wt 240.0 lb

## 2022-10-12 DIAGNOSIS — R7309 Other abnormal glucose: Secondary | ICD-10-CM

## 2022-10-12 DIAGNOSIS — I10 Essential (primary) hypertension: Secondary | ICD-10-CM | POA: Diagnosis not present

## 2022-10-12 DIAGNOSIS — I480 Paroxysmal atrial fibrillation: Secondary | ICD-10-CM | POA: Diagnosis not present

## 2022-10-12 DIAGNOSIS — Z6841 Body Mass Index (BMI) 40.0 and over, adult: Secondary | ICD-10-CM | POA: Diagnosis not present

## 2022-10-12 DIAGNOSIS — E782 Mixed hyperlipidemia: Secondary | ICD-10-CM

## 2022-10-12 DIAGNOSIS — D6869 Other thrombophilia: Secondary | ICD-10-CM | POA: Diagnosis not present

## 2022-10-12 DIAGNOSIS — K219 Gastro-esophageal reflux disease without esophagitis: Secondary | ICD-10-CM | POA: Diagnosis not present

## 2022-10-12 DIAGNOSIS — E66813 Obesity, class 3: Secondary | ICD-10-CM

## 2022-10-12 NOTE — Assessment & Plan Note (Signed)
Recommend continue to work on eating healthy diet and exercise.  

## 2022-10-12 NOTE — Assessment & Plan Note (Signed)
Secondary to xarelto for atrial fibrillation.

## 2022-10-12 NOTE — Assessment & Plan Note (Signed)
Continue xarelto 20 mg daily and metoprolol succinate 50 mg daily.

## 2022-10-12 NOTE — Assessment & Plan Note (Signed)
Check A1c. 

## 2022-10-12 NOTE — Patient Instructions (Addendum)
Needs Tdap at the pharmacy. Colonoscopy scheduled in November with Dr. Tomasa Rand.

## 2022-10-13 LAB — COMPREHENSIVE METABOLIC PANEL
ALT: 15 IU/L (ref 0–32)
AST: 17 IU/L (ref 0–40)
Albumin: 4.4 g/dL (ref 3.9–4.9)
Alkaline Phosphatase: 89 IU/L (ref 44–121)
BUN/Creatinine Ratio: 21 (ref 12–28)
BUN: 16 mg/dL (ref 8–27)
Bilirubin Total: 0.4 mg/dL (ref 0.0–1.2)
CO2: 27 mmol/L (ref 20–29)
Calcium: 10.1 mg/dL (ref 8.7–10.3)
Chloride: 93 mmol/L — ABNORMAL LOW (ref 96–106)
Creatinine, Ser: 0.75 mg/dL (ref 0.57–1.00)
Globulin, Total: 2.6 g/dL (ref 1.5–4.5)
Glucose: 103 mg/dL — ABNORMAL HIGH (ref 70–99)
Potassium: 4.9 mmol/L (ref 3.5–5.2)
Sodium: 136 mmol/L (ref 134–144)
Total Protein: 7 g/dL (ref 6.0–8.5)
eGFR: 86 mL/min/{1.73_m2} (ref >59)

## 2022-10-13 LAB — LIPID PANEL
Chol/HDL Ratio: 2.9 ratio (ref 0.0–4.4)
Cholesterol, Total: 178 mg/dL (ref 100–199)
HDL: 62 mg/dL (ref >39)
LDL Chol Calc (NIH): 97 mg/dL (ref 0–99)
Triglycerides: 104 mg/dL (ref 0–149)
VLDL Cholesterol Cal: 19 mg/dL (ref 5–40)

## 2022-10-13 LAB — HEMOGLOBIN A1C
Est. average glucose Bld gHb Est-mCnc: 120 mg/dL
Hgb A1c MFr Bld: 5.8 % — ABNORMAL HIGH (ref 4.8–5.6)

## 2022-10-15 DIAGNOSIS — G4733 Obstructive sleep apnea (adult) (pediatric): Secondary | ICD-10-CM | POA: Diagnosis not present

## 2022-11-10 ENCOUNTER — Other Ambulatory Visit: Payer: Self-pay | Admitting: Cardiology

## 2022-11-10 DIAGNOSIS — I480 Paroxysmal atrial fibrillation: Secondary | ICD-10-CM

## 2022-11-10 NOTE — Telephone Encounter (Signed)
Prescription refill request for Xarelto received.  Indication: AF Last office visit: 08/04/21  Carleene Mains MD  (Appt 11/30/22) Weight: 118.3kg Age: 67 Scr: 0.75 on 10/12/22 CrCl:  132.21  Based on above findings Xarelto 20mg  daily is the appropriate dose.  Refill approved.

## 2022-11-11 DIAGNOSIS — M25562 Pain in left knee: Secondary | ICD-10-CM | POA: Diagnosis not present

## 2022-11-14 ENCOUNTER — Other Ambulatory Visit: Payer: Self-pay | Admitting: Family Medicine

## 2022-11-14 DIAGNOSIS — E782 Mixed hyperlipidemia: Secondary | ICD-10-CM

## 2022-11-17 ENCOUNTER — Encounter: Payer: PPO | Admitting: Gastroenterology

## 2022-11-30 ENCOUNTER — Encounter: Payer: Self-pay | Admitting: Cardiology

## 2022-11-30 ENCOUNTER — Ambulatory Visit: Payer: PPO | Attending: Cardiology | Admitting: Cardiology

## 2022-11-30 VITALS — BP 126/76 | HR 68 | Ht 62.0 in | Wt 238.0 lb

## 2022-11-30 DIAGNOSIS — I10 Essential (primary) hypertension: Secondary | ICD-10-CM

## 2022-11-30 DIAGNOSIS — D6869 Other thrombophilia: Secondary | ICD-10-CM

## 2022-11-30 DIAGNOSIS — G4733 Obstructive sleep apnea (adult) (pediatric): Secondary | ICD-10-CM

## 2022-11-30 DIAGNOSIS — I4819 Other persistent atrial fibrillation: Secondary | ICD-10-CM

## 2022-11-30 NOTE — Progress Notes (Signed)
Electrophysiology Office Note:   Date:  11/30/2022  ID:  Helen West, DOB 1955/05/22, MRN [id number]  Primary Cardiologist: None Electrophysiologist: Kaylon Hitz Jorja Loa, MD      History of Present Illness:   Helen West is a 67 y.o. female with h/o atrial fibrillation post ablation, hyperlipidemia, hypertension, obesity, sleep apnea seen today for routine electrophysiology followup.   Since last being seen in our clinic the patient reports doing well.  She has had minimal episodes of atrial fibrillation.  She did have an episode 2 months ago that occurred around the time of a COVID-19 infection.  She converted to normal rhythm without issue..  she denies chest pain, palpitations, dyspnea, PND, orthopnea, nausea, vomiting, dizziness, syncope, edema, weight gain, or early satiety.   Review of systems complete and found to be negative unless listed in HPI.   EP Information / Studies Reviewed:    EKG is ordered today. Personal review as below.  EKG Interpretation Date/Time:  Monday November 30 2022 15:28:26 EDT Ventricular Rate:  68 PR Interval:    QRS Duration:  92 QT Interval:  414 QTC Calculation: 440 R Axis:   9  Text Interpretation: Sinus rhythm with sinus arrhythmia Premature ventricular complexes Confirmed by Nicholes Hibler ([id number]) on 11/30/2022 3:36:58 PM     Risk Assessment/Calculations:    CHA2DS2-VASc Score = 3   This indicates a 3.2% annual risk of stroke. The patient's score is based upon: CHF History: 0 HTN History: 1 Diabetes History: 0 Stroke History: 0 Vascular Disease History: 0 Age Score: 1 Gender Score: 1             Physical Exam:   VS:  BP 126/76   Pulse 68   Ht 5\' 2"  (1.575 m)   Wt 238 lb (108 kg)   SpO2 95%   BMI 43.53 kg/m    Wt Readings from Last 3 Encounters:  11/30/22 238 lb (108 kg)  10/12/22 240 lb (108.9 kg)  10/07/22 240 lb 3.2 oz (109 kg)     GEN: Well nourished, well developed in no acute distress NECK: No JVD; No  carotid bruits CARDIAC: Regular rate and rhythm, no murmurs, rubs, gallops RESPIRATORY:  Clear to auscultation without rales, wheezing or rhonchi  ABDOMEN: Soft, non-tender, non-distended EXTREMITIES:  No edema; No deformity   ASSESSMENT AND PLAN:    1.  Persistent atrial fibrillation: Post ablation 05/15/2020.  Remains in normal rhythm  2.  Secondary hypercoagulable state: Currently on Eliquis for atrial fibrillation  3.  Hypertension: Currently well-controlled  4.  Obstructive sleep apnea: CPAP compliance encouraged  5.  Morbid obesity: Lifestyle modification encouraged  6.  Preoperative evaluation: Has upcoming colonoscopy and possible knee replacement.  She has no cardiac complaints.  She would be at intermediate risk for these intermediate risk procedures.  No further cardiac testing is necessary.  Follow up with Dr. Elberta Fortis in 12 months  Signed, Lakita Sahlin Jorja Loa, MD

## 2022-11-30 NOTE — Patient Instructions (Signed)

## 2022-12-01 ENCOUNTER — Other Ambulatory Visit: Payer: Self-pay | Admitting: Family Medicine

## 2022-12-01 DIAGNOSIS — I10 Essential (primary) hypertension: Secondary | ICD-10-CM

## 2022-12-09 ENCOUNTER — Encounter: Payer: Self-pay | Admitting: Gastroenterology

## 2022-12-22 ENCOUNTER — Other Ambulatory Visit: Payer: Self-pay | Admitting: Family Medicine

## 2022-12-22 ENCOUNTER — Encounter: Payer: PPO | Admitting: Gastroenterology

## 2023-01-15 DIAGNOSIS — G4733 Obstructive sleep apnea (adult) (pediatric): Secondary | ICD-10-CM | POA: Diagnosis not present

## 2023-01-28 ENCOUNTER — Other Ambulatory Visit: Payer: Self-pay | Admitting: Family Medicine

## 2023-01-28 DIAGNOSIS — I480 Paroxysmal atrial fibrillation: Secondary | ICD-10-CM

## 2023-02-09 ENCOUNTER — Other Ambulatory Visit: Payer: Self-pay | Admitting: Cardiology

## 2023-02-09 DIAGNOSIS — I480 Paroxysmal atrial fibrillation: Secondary | ICD-10-CM

## 2023-02-09 NOTE — Telephone Encounter (Signed)
 Prescription refill request for Xarelto received.  Indication:afib Last office visit:10/24 Weight:108 kg Age:68 Scr:0.75  9/24 CrCl:120.7  ml/min  Prescription refilled

## 2023-02-17 ENCOUNTER — Other Ambulatory Visit: Payer: Self-pay | Admitting: Family Medicine

## 2023-02-17 DIAGNOSIS — M1712 Unilateral primary osteoarthritis, left knee: Secondary | ICD-10-CM | POA: Diagnosis not present

## 2023-02-17 DIAGNOSIS — E782 Mixed hyperlipidemia: Secondary | ICD-10-CM

## 2023-02-25 ENCOUNTER — Other Ambulatory Visit: Payer: Self-pay | Admitting: Family Medicine

## 2023-02-25 DIAGNOSIS — I10 Essential (primary) hypertension: Secondary | ICD-10-CM

## 2023-03-24 ENCOUNTER — Other Ambulatory Visit: Payer: Self-pay

## 2023-04-12 NOTE — Progress Notes (Deleted)
 Subjective:  Patient ID: Helen West, female    DOB: 07/02/1955  Age: 68 y.o. MRN: [id number]  Chief Complaint  Patient presents with   Medical Management of Chronic Issues    Discussed the use of AI scribe software for clinical note transcription with the patient, who gave verbal consent to proceed.  History of Present Illness         Atrial fibrillation on xarelto 20 mg daily and metoprolol succinate 50 mg once daily.   OSA: She is on CPAP 20 pressure. Helps   Hyperlipidemia: Current medications: Crestor 10 mg daily.   Hypertension: Current medications: Chlorthalidone 25 mg daily, Losartan 100 mg daily, Metoprolol succinate 50 mg daily  Diet: Healthy. Fruits and vegetable. Eats steak, Eats fish.   Exercise: [employer]. Exercising in pool.  GERD-Protonix 40 mg twice daily      10/12/2022    8:58 AM 06/22/2022    1:30 PM 06/18/2022    8:00 AM 05/26/2022    9:08 AM 12/09/2021   10:26 AM  Depression screen PHQ 2/9  Decreased Interest 0 0 0 0 0  Down, Depressed, Hopeless 0 0 0 0 0  PHQ - 2 Score 0 0 0 0 0  Altered sleeping 0 0     Tired, decreased energy 0 0     Change in appetite 0 0     Feeling bad or failure about yourself  0 0     Trouble concentrating 0 0     Moving slowly or fidgety/restless 0 0     Suicidal thoughts 0 0     PHQ-9 Score 0 0     Difficult doing work/chores Not difficult at all Not difficult at all           10/12/2022    8:57 AM  Fall Risk   Falls in the past year? 0  Number falls in past yr: 0  Injury with Fall? 0  Risk for fall due to : Impaired mobility  Follow up Falls evaluation completed;Falls prevention discussed    Patient Care Team: Blane Ohara, MD as PCP - General (Family Medicine) Regan Lemming, MD as PCP - Electrophysiology (Cardiology) Quintella Reichert, MD as PCP - Sleep Medicine (Cardiology) Earvin Hansen, [facility] (Inactive) as Pharmacist (Pharmacist) Earvin Hansen, [facility] (Inactive) as Pharmacist (Pharmacist) Antony Blackbird, MD as Consulting Physician (Radiation Oncology)   Review of Systems  Current Outpatient Medications on File Prior to Visit  Medication Sig Dispense Refill   acetaminophen (TYLENOL) 650 MG CR tablet Take 1,950 mg by mouth in the morning and at bedtime.     albuterol (VENTOLIN HFA) 108 (90 Base) MCG/ACT inhaler Inhale 2 puffs into the lungs every 6 (six) hours as needed for wheezing or shortness of breath. 8 g 2   chlorthalidone (HYGROTON) 25 MG tablet TAKE 1 TABLET BY MOUTH ONCE DAILY. 90 tablet 0   fluticasone (FLONASE) 50 MCG/ACT nasal spray Place 2 sprays into both nostrils daily. 16 g 6   HYDROcodone bit-homatropine (HYDROMET) 5-1.5 MG/5ML syrup Take 5 mLs by mouth every 6 (six) hours as needed for cough. 120 mL 0   losartan (COZAAR) 100 MG tablet TAKE 1 TABLET BY MOUTH DAILY. 90 tablet 0   meclizine (ANTIVERT) 25 MG tablet Take 1 tablet (25 mg total) by mouth 3 (three) times daily as needed for dizziness. 30 tablet 0   metoprolol succinate (TOPROL-XL) 50 MG 24 hr tablet TAKE 1 TABLET BY MOUTH DAILY. TAKE WITH  OR IMMEDIATELY FOLLOWING A MEAL. 90 tablet 0   pantoprazole (PROTONIX) 40 MG tablet TAKE 1 TABLET BY MOUTH DAILY. 90 tablet 0   prochlorperazine (COMPAZINE) 10 MG tablet Take 1 tablet (10 mg total) by mouth 2 (two) times daily as needed for nausea or vomiting. 10 tablet 0   Respiratory Therapy Supplies (CARETOUCH CPAP & BIPAP HOSE) MISC by Does not apply route.     rivaroxaban (XARELTO) 20 MG TABS tablet TAKE 1 TABLET ONCE DAILY WITH SUPPER 90 tablet 1   rosuvastatin (CRESTOR) 10 MG tablet TAKE 1 TABLET BY MOUTH DAILY 90 tablet 0   No current facility-administered medications on file prior to visit.   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   Complication of anesthesia    Loss of vocal/voice x 2 months   Dysrhythmia    Endometrial cancer (HCC) 11/02/2017    Headache    High cholesterol    History of radiation therapy 01/12/2018   11/14, 11/21, 11/27, 12/4, 01/12/18:  Vaginal cuff, 6 Gy in 5 fractions for a total dose of 30 Gy  Dr Antony Blackbird   Malignant neoplasm of overlapping sites of body of uterus (HCC) 12/29/2017   xrt 11/19   Obesity    Post-menopausal bleeding    Upper respiratory tract infection due to COVID-19 virus 02/24/2021   Past Surgical History:  Procedure Laterality Date   ATRIAL FIBRILLATION ABLATION N/A 05/15/2020   Procedure: ATRIAL FIBRILLATION ABLATION;  Surgeon: Regan Lemming, MD;  Location: [facility];  Service: Cardiovascular;  Laterality: N/A;   ROBOTIC ASSISTED TOTAL HYSTERECTOMY WITH BILATERAL SALPINGO OOPHERECTOMY Bilateral 11/02/2017   Procedure: XI ROBOTIC ASSISTED TOTAL HYSTERECTOMY WITH BILATERAL SALPINGO OOPHORECTOMY;  Surgeon: Adolphus Birchwood, MD;  Location: [facility];  Service: Gynecology;  Laterality: Bilateral;   SENTINEL NODE BIOPSY N/A 11/02/2017   Procedure: SENTINEL NODE BIOPSY;  Surgeon: Adolphus Birchwood, MD;  Location: [facility];  Service: Gynecology;  Laterality: N/A;   TONSILLECTOMY     TOTAL HIP ARTHROPLASTY     TUBAL LIGATION      Family History  Problem Relation Age of Onset   Aneurysm Mother    Emphysema Father    Cancer Paternal Uncle    Breast cancer Neg Hx    Social History   Socioeconomic History   Marital status: Married    Spouse name: Lennox Grumbles   Number of children: 2   Years of education: Not on file   Highest education level: Not on file  Occupational History   Not on file  Tobacco Use   Smoking status: Never   Smokeless tobacco: Never  Vaping Use   Vaping status: Never Used  Substance and Sexual Activity   Alcohol use: Not Currently    Comment: occasional social   Drug use: Never   Sexual activity: Not on file  Other Topics Concern   Not on file  Social History Narrative   2 children from previous marriage, 2 step children and many grandchildren    Social Drivers of  Corporate investment banker Strain: Low Risk  (12/09/2021)   Overall Financial Resource Strain (CARDIA)    Difficulty of Paying Living Expenses: Not hard at all  Food Insecurity: No Food Insecurity (12/09/2021)   Hunger Vital Sign    Worried About Running Out of Food in the Last Year: Never  true    Ran Out of Food in the Last Year: Never true  Transportation Needs: No Transportation Needs (12/09/2021)   PRAPARE - Administrator, Civil Service (Medical): No    Lack of Transportation (Non-Medical): No  Physical Activity: Inactive (12/09/2021)   Exercise Vital Sign    Days of Exercise per Week: 0 days    Minutes of Exercise per Session: 0 min  Stress: No Stress Concern Present (12/09/2021)   [employer] of Occupational Health - Occupational Stress Questionnaire    Feeling of Stress : Not at all  Social Connections: Moderately Isolated (05/26/2022)   Social Connection and Isolation Panel [NHANES]    Frequency of Communication with Friends and Family: More than three times a week    Frequency of Social Gatherings with Friends and Family: Twice a week    Attends Religious Services: Never    Database administrator or Organizations: No    Attends Engineer, structural: Not on file    Marital Status: Married    Objective:  There were no vitals taken for this visit.     11/30/2022    3:22 PM 10/12/2022    8:52 AM 10/07/2022    9:59 AM  BP/Weight  Systolic BP 126 124 120  Diastolic BP 76 68 72  Wt. (Lbs) 238 240 240.2  BMI 43.53 kg/m2 42.51 kg/m2 42.55 kg/m2    Physical Exam  Diabetic Foot Exam - Simple   No data filed      Lab Results  Component Value Date   WBC 9.3 09/30/2022   HGB 14.0 09/30/2022   HCT 40.7 09/30/2022   PLT 326 09/30/2022   GLUCOSE 103 (H) 10/12/2022   CHOL 178 10/12/2022   TRIG 104 10/12/2022   HDL 62 10/12/2022   LDLCALC 97 10/12/2022   ALT 15 10/12/2022   AST 17 10/12/2022   NA 136 10/12/2022   K 4.9 10/12/2022   CL 93 (L)  10/12/2022   CREATININE 0.75 10/12/2022   BUN 16 10/12/2022   CO2 27 10/12/2022   TSH 3.110 06/18/2022   HGBA1C 5.8 (H) 10/12/2022      Assessment & Plan:  Assessment and Plan                Primary hypertension  Gastroesophageal reflux disease without esophagitis  Mixed hyperlipidemia     No orders of the defined types were placed in this encounter.   No orders of the defined types were placed in this encounter.    Follow-up: No follow-ups on file.   I,Jerae Izard,acting as a Neurosurgeon for Blane Ohara, MD.,have documented all relevant documentation on the behalf of Blane Ohara, MD,as directed by  Blane Ohara, MD while in the presence of Blane Ohara, MD.   An After Visit Summary was printed and given to the patient.  Blane Ohara, MD [facility] [phone]

## 2023-04-13 ENCOUNTER — Encounter: Payer: PPO | Admitting: Family Medicine

## 2023-04-13 DIAGNOSIS — I10 Essential (primary) hypertension: Secondary | ICD-10-CM

## 2023-04-13 DIAGNOSIS — R7303 Prediabetes: Secondary | ICD-10-CM

## 2023-04-13 DIAGNOSIS — E782 Mixed hyperlipidemia: Secondary | ICD-10-CM

## 2023-04-13 DIAGNOSIS — K219 Gastro-esophageal reflux disease without esophagitis: Secondary | ICD-10-CM

## 2023-04-15 DIAGNOSIS — G4733 Obstructive sleep apnea (adult) (pediatric): Secondary | ICD-10-CM | POA: Diagnosis not present

## 2023-04-18 NOTE — Progress Notes (Signed)
 This encounter was created in error - please disregard.

## 2023-04-27 ENCOUNTER — Other Ambulatory Visit: Payer: Self-pay

## 2023-04-27 DIAGNOSIS — I480 Paroxysmal atrial fibrillation: Secondary | ICD-10-CM

## 2023-05-19 DIAGNOSIS — M1712 Unilateral primary osteoarthritis, left knee: Secondary | ICD-10-CM | POA: Diagnosis not present

## 2023-05-20 ENCOUNTER — Other Ambulatory Visit: Payer: Self-pay | Admitting: Family Medicine

## 2023-05-20 DIAGNOSIS — E782 Mixed hyperlipidemia: Secondary | ICD-10-CM

## 2023-06-01 ENCOUNTER — Other Ambulatory Visit: Payer: Self-pay | Admitting: Family Medicine

## 2023-06-01 DIAGNOSIS — I10 Essential (primary) hypertension: Secondary | ICD-10-CM

## 2023-06-24 ENCOUNTER — Other Ambulatory Visit: Payer: Self-pay | Admitting: Family Medicine

## 2023-06-30 ENCOUNTER — Telehealth: Payer: Self-pay

## 2023-06-30 DIAGNOSIS — M1712 Unilateral primary osteoarthritis, left knee: Secondary | ICD-10-CM | POA: Diagnosis not present

## 2023-06-30 NOTE — Telephone Encounter (Signed)
 Left message for pt to call our office and ask for the preop team to schedule TELE Preop Appt.

## 2023-06-30 NOTE — Telephone Encounter (Signed)
 Patient with diagnosis of afib on Xarelto  for anticoagulation.    Procedure:  Left total knee arthroplasty  Date of procedure: TBD   CHA2DS2-VASc Score = 3   This indicates a 3.2% annual risk of stroke. The patient's score is based upon: CHF History: 0 HTN History: 1 Diabetes History: 0 Stroke History: 0 Vascular Disease History: 0 Age Score: 1 Gender Score: 1      CrCl 80 ml/min Platelet count 326  Patient has not had an Afib/aflutter ablation within the last 3 months or DCCV within the last 30 days  Per office protocol, patient can hold Xarleto for 3 days prior to procedure.    **This guidance is not considered finalized until pre-operative APP has relayed final recommendations.**

## 2023-06-30 NOTE — Telephone Encounter (Signed)
   Name: Helen West  DOB: 1955-08-03  MRN: [id number]  Primary Cardiologist: None   Preoperative team, please contact this patient and set up a phone call appointment for further preoperative risk assessment. Please obtain consent and complete medication review. Thank you for your help.  I confirm that guidance regarding antiplatelet and oral anticoagulation therapy has been completed and, if necessary, noted below.  I also confirmed the patient resides in the state of [place] . As per [facility] Medical Board telemedicine laws, the patient must reside in the state in which the provider is licensed.   Patient with diagnosis of afib on Xarelto  for anticoagulation.     Procedure:  Left total knee arthroplasty  Date of procedure: TBD     CHA2DS2-VASc Score = 3   This indicates a 3.2% annual risk of stroke. The patient's score is based upon: CHF History: 0 HTN History: 1 Diabetes History: 0 Stroke History: 0 Vascular Disease History: 0 Age Score: 1 Gender Score: 1       CrCl 80 ml/min Platelet count 326   Patient has not had an Afib/aflutter ablation within the last 3 months or DCCV within the last 30 days   Per office protocol, patient can hold Xarleto for 3 days prior to procedure.       Leala Prince, PA-C 06/30/2023, 4:24 PM [facility]

## 2023-06-30 NOTE — Telephone Encounter (Signed)
   Pre-operative Risk Assessment    Patient Name: Helen West  DOB: 11-Apr-1955 MRN: [id number]   Date of last office visit: 11/30/22 Date of next office visit: Not scheduled   Request for Surgical Clearance    Procedure:  Left total knee arthroplasty  Date of Surgery:  Clearance TBD                                Surgeon:  Not indicated Surgeon's Group or Practice Name:  [facility] Phone number:  [phone] Fax number:  [phone]   Type of Clearance Requested:   - Medical  - Pharmacy:  Hold Rivaroxaban  (Xarelto ) x 48 hours prior   Type of Anesthesia:  General    Additional requests/questions:    Gardiner Jumper   06/30/2023, 11:39 AM

## 2023-07-01 ENCOUNTER — Telehealth: Payer: Self-pay

## 2023-07-01 NOTE — Telephone Encounter (Signed)
 Spoke with patient who is agreeable to do a tele visit on 6/5 at 2:20 pm. Med rec and consent have been done.

## 2023-07-01 NOTE — Telephone Encounter (Signed)
  Patient Consent for Virtual Visit        Helen West has provided verbal consent on 07/01/2023 for a virtual visit (video or telephone).   CONSENT FOR VIRTUAL VISIT FOR:  Helen West  By participating in this virtual visit I agree to the following:  I hereby voluntarily request, consent and authorize [facility] and its employed or contracted physicians, physician assistants, nurse practitioners or other licensed health care professionals (the Practitioner), to provide me with telemedicine health care services (the "Services") as deemed necessary by the treating Practitioner. I acknowledge and consent to receive the Services by the Practitioner via telemedicine. I understand that the telemedicine visit will involve communicating with the Practitioner through live audiovisual communication technology and the disclosure of certain medical information by electronic transmission. I acknowledge that I have been given the opportunity to request an in-person assessment or other available alternative prior to the telemedicine visit and am voluntarily participating in the telemedicine visit.  I understand that I have the right to withhold or withdraw my consent to the use of telemedicine in the course of my care at any time, without affecting my right to future care or treatment, and that the Practitioner or I may terminate the telemedicine visit at any time. I understand that I have the right to inspect all information obtained and/or recorded in the course of the telemedicine visit and may receive copies of available information for a reasonable fee.  I understand that some of the potential risks of receiving the Services via telemedicine include:  Delay or interruption in medical evaluation due to technological equipment failure or disruption; Information transmitted may not be sufficient (e.g. poor resolution of images) to allow for appropriate medical decision making by the  Practitioner; and/or  In rare instances, security protocols could fail, causing a breach of personal health information.  Furthermore, I acknowledge that it is my responsibility to provide information about my medical history, conditions and care that is complete and accurate to the best of my ability. I acknowledge that Practitioner's advice, recommendations, and/or decision may be based on factors not within their control, such as incomplete or inaccurate data provided by me or distortions of diagnostic images or specimens that may result from electronic transmissions. I understand that the practice of medicine is not an exact science and that Practitioner makes no warranties or guarantees regarding treatment outcomes. I acknowledge that a copy of this consent can be made available to me via my patient portal [facility] MyChart), or I can request a printed copy by calling the office of [facility].    I understand that my insurance will be billed for this visit.   I have read or had this consent read to me. I understand the contents of this consent, which adequately explains the benefits and risks of the Services being provided via telemedicine.  I have been provided ample opportunity to ask questions regarding this consent and the Services and have had my questions answered to my satisfaction. I give my informed consent for the services to be provided through the use of telemedicine in my medical care

## 2023-07-01 NOTE — Telephone Encounter (Signed)
 Pt returning call

## 2023-07-08 ENCOUNTER — Ambulatory Visit: Attending: Cardiology | Admitting: Emergency Medicine

## 2023-07-08 DIAGNOSIS — Z01810 Encounter for preprocedural cardiovascular examination: Secondary | ICD-10-CM | POA: Diagnosis not present

## 2023-07-08 DIAGNOSIS — Z01818 Encounter for other preprocedural examination: Secondary | ICD-10-CM | POA: Diagnosis not present

## 2023-07-08 DIAGNOSIS — Z79899 Other long term (current) drug therapy: Secondary | ICD-10-CM | POA: Diagnosis not present

## 2023-07-08 DIAGNOSIS — E559 Vitamin D deficiency, unspecified: Secondary | ICD-10-CM | POA: Diagnosis not present

## 2023-07-08 DIAGNOSIS — M79609 Pain in unspecified limb: Secondary | ICD-10-CM | POA: Diagnosis not present

## 2023-07-08 LAB — LAB REPORT - SCANNED
A1c: 6.2
EGFR: 93

## 2023-07-08 NOTE — Progress Notes (Signed)
 Virtual Visit via Telephone Note   Because of Helen West co-morbid illnesses, she is at least at moderate risk for complications without adequate follow up.  This format is felt to be most appropriate for this patient at this time.  Due to technical limitations with video connection Web designer), today's appointment will be conducted as an audio only telehealth visit, and RUEY STORER verbally agreed to proceed in this manner.   All issues noted in this document were discussed and addressed.  No physical exam could be performed with this format.  Evaluation Performed:  Preoperative cardiovascular risk assessment _____________   Date:  07/08/2023   Patient ID:  Helen West, DOB 16-May-1955, MRN [id number] Patient Location:  Home Provider location:   Office  Primary Care Provider:  Mercy Stall, MD Primary Cardiologist:  None  Chief Complaint / Patient Profile   68 y.o. y/o female with a h/o persistent atrial fibrillation, hypertension, obstructive sleep apnea, morbid obesity who is pending left total knee arthroplasty with [facility]opedic [facility] sports medicine on date TBD and presents today for telephonic preoperative cardiovascular risk assessment.  History of Present Illness    Helen West is a 68 y.o. female who presents via audio/video conferencing for a telehealth visit today.  Pt was last seen in cardiology clinic on 11/30/2022 by Dr. Lawana Pray.  At that time LASHON BERINGER was doing well.  The patient is now pending procedure as outlined above. Since her last visit, she  denies chest pain, shortness of breath, lower extremity edema, fatigue, palpitations, melena, hematuria, hemoptysis, diaphoresis, weakness, presyncope, syncope, orthopnea, and PND.  Today patient is doing well overall.  She is without any acute cardiovascular concerns or complaints.  She notes 1 episode since last seen in office of tachycardia, she took an extra dose of metoprolol  and her  tachycardia resolved.  She tells me she is very symptomatic when she is in atrial fibrillation and denies any reoccurrence.  She does stay relatively active.  She goes to [facility] at least 3 times a week without any anginal or exertional symptoms.  She enjoys doing yard work and recently cleaned off her deck and plan of flowers.  She notes that her activity level is somewhat limited due to her knee pain.  Overall she can complete greater than 4 METS.  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   Complication of anesthesia    Loss of vocal/voice x 2 months   Dysrhythmia    Endometrial cancer (HCC) 11/02/2017   Headache    High cholesterol    History of radiation therapy 01/12/2018   11/14, 11/21, 11/27, 12/4, 01/12/18:  Vaginal cuff, 6 Gy in 5 fractions for a total dose of 30 Gy  Dr Retta Caster   Malignant neoplasm of overlapping sites of body of uterus (HCC) 12/29/2017   xrt 11/19   Obesity    Post-menopausal bleeding    Upper respiratory tract infection due to COVID-19 virus 02/24/2021   Past Surgical History:  Procedure Laterality Date   ATRIAL FIBRILLATION ABLATION N/A 05/15/2020   Procedure: ATRIAL FIBRILLATION ABLATION;  Surgeon: Lei Pump, MD;  Location: [facility];  Service: Cardiovascular;  Laterality: N/A;   ROBOTIC ASSISTED TOTAL HYSTERECTOMY WITH BILATERAL SALPINGO OOPHERECTOMY Bilateral 11/02/2017   Procedure: XI ROBOTIC  ASSISTED TOTAL HYSTERECTOMY WITH BILATERAL SALPINGO OOPHORECTOMY;  Surgeon: Alphonso Aschoff, MD;  Location: [facility];  Service: Gynecology;  Laterality: Bilateral;   SENTINEL NODE BIOPSY N/A 11/02/2017   Procedure: SENTINEL NODE BIOPSY;  Surgeon: Alphonso Aschoff, MD;  Location: [facility];  Service: Gynecology;  Laterality: N/A;   TONSILLECTOMY     TOTAL HIP ARTHROPLASTY     TUBAL LIGATION      Allergies  No Known  Allergies  Home Medications    Prior to Admission medications   Medication Sig Start Date End Date Taking? Authorizing Provider  acetaminophen  (TYLENOL ) 650 MG CR tablet Take 1,950 mg by mouth in the morning and at bedtime.    [provider]  albuterol  (VENTOLIN  HFA) 108 (90 Base) MCG/ACT inhaler Inhale 2 puffs into the lungs every 6 (six) hours as needed for wheezing or shortness of breath. 06/18/22   [name], Burleigh Carp, MD  chlorthalidone  (HYGROTON ) 25 MG tablet TAKE 1 TABLET BY MOUTH ONCE DAILY. 06/01/23   CoxBurleigh Carp, MD  fluticasone  (FLONASE ) 50 MCG/ACT nasal spray Place 2 sprays into both nostrils daily. 06/18/22   CoxBurleigh Carp, MD  HYDROcodone  bit-homatropine (HYDROMET) 5-1.5 MG/5ML syrup Take 5 mLs by mouth every 6 (six) hours as needed for cough. 09/25/22   [name], Burleigh Carp, MD  losartan  (COZAAR ) 100 MG tablet TAKE 1 TABLET BY MOUTH DAILY. 06/01/23   [name], Burleigh Carp, MD  meclizine  (ANTIVERT ) 25 MG tablet Take 1 tablet (25 mg total) by mouth 3 (three) times daily as needed for dizziness. 12/25/20   Allegra Arch, NP  metoprolol  succinate (TOPROL -XL) 50 MG 24 hr tablet TAKE 1 TABLET BY MOUTH DAILY. TAKE WITH OR IMMEDIATELY FOLLOWING A MEAL. 04/27/23   Sirivol, Mamatha, MD  pantoprazole  (PROTONIX ) 40 MG tablet TAKE 1 TABLET BY MOUTH DAILY. 06/24/23   Mercy Stall, MD  prochlorperazine  (COMPAZINE ) 10 MG tablet Take 1 tablet (10 mg total) by mouth 2 (two) times daily as needed for nausea or vomiting. 09/30/22   Roemhildt, Lorin T, PA-C  Respiratory Therapy Supplies (CARETOUCH CPAP & BIPAP HOSE) MISC by Does not apply route.    [provider]  rivaroxaban  (XARELTO ) 20 MG TABS tablet TAKE 1 TABLET ONCE DAILY WITH SUPPER 02/09/23   Camnitz, Babetta Lesch, MD  rosuvastatin  (CRESTOR ) 10 MG tablet TAKE 1 TABLET BY MOUTH DAILY 05/20/23   Mercy Stall, MD    Physical Exam    Vital Signs:  Helen West does not have vital signs available for review today.  Given telephonic nature of  communication, physical exam is limited. AAOx3. NAD. Normal affect.  Speech and respirations are unlabored.  Accessory Clinical Findings    None  Assessment & Plan    1.  Preoperative Cardiovascular Risk Assessment: According to the Revised Cardiac Risk Index (RCRI), her Perioperative Risk of Major Cardiac Event is (%): 0.4. Her Functional Capacity in METs is: 5.62 according to the Duke Activity Status Index (DASI).. Therefore, based on ACC/AHA guidelines, patient would be at acceptable risk for the planned procedure without further cardiovascular testing.   The patient was advised that if she develops new symptoms prior to surgery to contact our office to arrange for a follow-up visit, and she verbalized understanding.  Patient has not had an Afib/aflutter ablation within the last 3 months or DCCV within the last 30 days   Per office protocol, patient can hold Xarleto for 3 days prior to procedure.    A copy of this note will be routed to requesting surgeon.  Time:   Today, I have spent 7 minutes with the patient with telehealth technology discussing medical history, symptoms, and management plan.     Ava Boatman, NP  07/08/2023, 2:27 PM

## 2023-07-09 DIAGNOSIS — G4733 Obstructive sleep apnea (adult) (pediatric): Secondary | ICD-10-CM | POA: Diagnosis not present

## 2023-07-12 ENCOUNTER — Encounter: Payer: Self-pay | Admitting: Family Medicine

## 2023-07-12 ENCOUNTER — Ambulatory Visit (INDEPENDENT_AMBULATORY_CARE_PROVIDER_SITE_OTHER): Admitting: Family Medicine

## 2023-07-12 VITALS — BP 132/80 | HR 84 | Temp 98.1°F | Ht 62.0 in | Wt 221.0 lb

## 2023-07-12 DIAGNOSIS — Z01818 Encounter for other preprocedural examination: Secondary | ICD-10-CM | POA: Diagnosis not present

## 2023-07-12 DIAGNOSIS — K219 Gastro-esophageal reflux disease without esophagitis: Secondary | ICD-10-CM | POA: Diagnosis not present

## 2023-07-12 DIAGNOSIS — E871 Hypo-osmolality and hyponatremia: Secondary | ICD-10-CM | POA: Diagnosis not present

## 2023-07-12 DIAGNOSIS — E559 Vitamin D deficiency, unspecified: Secondary | ICD-10-CM | POA: Diagnosis not present

## 2023-07-12 DIAGNOSIS — M25562 Pain in left knee: Secondary | ICD-10-CM

## 2023-07-12 NOTE — Progress Notes (Signed)
 Subjective:  Patient ID: Helen West, female    DOB: 12-05-1955  Age: 68 y.o. MRN: [id number]  Chief Complaint  Patient presents with   Medical Management of Chronic Issues    Discussed the use of AI scribe software for clinical note transcription with the patient, who gave verbal consent to proceed.  History of Present Illness   Helen West is a 68 year old female who presents for preoperative evaluation for left total knee arthroplasty.  She is preparing for a left total knee arthroplasty due to severe knee pain, described as 'bone on bone'. Previous treatments, including cortisone and gel injections, have not provided relief. Surgery is expected in approximately four weeks, pending scheduling availability.  She has no history of adverse outcomes to anesthesia, no loose teeth, and no decreased range of motion in the neck. She can walk up two flights of stairs without significant shortness of breath or chest pain.  Recent workup includes an EKG performed at the hospital. She notes her lab work indicated low sodium and vitamin D levels, for which she is taking a vitamin D supplement at a dose of 50,000 units once a week.  Current medications include Xarelto , which she will hold for three days prior to surgery, and Protonix , taken once at night for reflux. She also takes Tylenol  in the morning and at night for pain management.  She has a history of hip surgery and has support at home from her family, including her stepdaughter and partner, who assisted her during her previous recovery. She is preparing for the postoperative period by ensuring her household needs are met in advance.      Patient is here for a Pre-operative physical at the request of  Marcene Serve, MD  She/He is having left total knee arthroplasty for left knee pain.  Personal or family hx of adverse outcome to anesthesia? No  Chipped, cracked, missing, or loose teeth? No  Decreased ROM of neck? No  Able to walk  up 2 flights of stairs without becoming significantly short of breath or having chest pain? No       10/12/2022    8:58 AM 06/22/2022    1:30 PM 06/18/2022    8:00 AM 05/26/2022    9:08 AM 12/09/2021   10:26 AM  Depression screen PHQ 2/9  Decreased Interest 0 0 0 0 0  Down, Depressed, Hopeless 0 0 0 0 0  PHQ - 2 Score 0 0 0 0 0  Altered sleeping 0 0     Tired, decreased energy 0 0     Change in appetite 0 0     Feeling bad or failure about yourself  0 0     Trouble concentrating 0 0     Moving slowly or fidgety/restless 0 0     Suicidal thoughts 0 0     PHQ-9 Score 0 0     Difficult doing work/chores Not difficult at all Not difficult at all           10/12/2022    8:57 AM  Fall Risk   Falls in the past year? 0  Number falls in past yr: 0  Injury with Fall? 0  Risk for fall due to : Impaired mobility  Follow up Falls evaluation completed;Falls prevention discussed    Patient Care Team: Mercy Stall, MD as PCP - General (Family Medicine) Lei Pump, MD as PCP - Electrophysiology (Cardiology) Jacqueline Matsu, MD as PCP - Sleep  Medicine (Cardiology) Steven Elam, [facility] (Inactive) as Pharmacist (Pharmacist) Steven Elam, [facility] (Inactive) as Pharmacist (Pharmacist) Retta Caster, MD as Consulting Physician (Radiation Oncology)   Review of Systems  Constitutional:  Negative for chills, diaphoresis, fatigue and fever.  HENT:  Negative for congestion, ear pain and sinus pain.   Respiratory:  Negative for cough and shortness of breath.   Cardiovascular:  Negative for chest pain.  Gastrointestinal:  Negative for abdominal pain, constipation, nausea and vomiting.  Genitourinary:  Negative for dysuria.  Musculoskeletal:  Positive for arthralgias (left knee pain).  Neurological:  Negative for weakness and headaches.  Psychiatric/Behavioral:  Negative for dysphoric mood. The patient is not nervous/anxious.     Current Outpatient Medications on File Prior to Visit   Medication Sig Dispense Refill   acetaminophen  (TYLENOL ) 650 MG CR tablet Take 1,950 mg by mouth in the morning and at bedtime.     albuterol  (VENTOLIN  HFA) 108 (90 Base) MCG/ACT inhaler Inhale 2 puffs into the lungs every 6 (six) hours as needed for wheezing or shortness of breath. 8 g 2   chlorthalidone  (HYGROTON ) 25 MG tablet TAKE 1 TABLET BY MOUTH ONCE DAILY. 90 tablet 0   fluticasone  (FLONASE ) 50 MCG/ACT nasal spray Place 2 sprays into both nostrils daily. 16 g 6   HYDROcodone  bit-homatropine (HYDROMET) 5-1.5 MG/5ML syrup Take 5 mLs by mouth every 6 (six) hours as needed for cough. 120 mL 0   losartan  (COZAAR ) 100 MG tablet TAKE 1 TABLET BY MOUTH DAILY. 90 tablet 0   meclizine  (ANTIVERT ) 25 MG tablet Take 1 tablet (25 mg total) by mouth 3 (three) times daily as needed for dizziness. 30 tablet 0   metoprolol  succinate (TOPROL -XL) 50 MG 24 hr tablet TAKE 1 TABLET BY MOUTH DAILY. TAKE WITH OR IMMEDIATELY FOLLOWING A MEAL. 90 tablet 0   pantoprazole  (PROTONIX ) 40 MG tablet TAKE 1 TABLET BY MOUTH DAILY. 90 tablet 0   prochlorperazine  (COMPAZINE ) 10 MG tablet Take 1 tablet (10 mg total) by mouth 2 (two) times daily as needed for nausea or vomiting. 10 tablet 0   Respiratory Therapy Supplies (CARETOUCH CPAP & BIPAP HOSE) MISC by Does not apply route.     rivaroxaban  (XARELTO ) 20 MG TABS tablet TAKE 1 TABLET ONCE DAILY WITH SUPPER 90 tablet 1   rosuvastatin  (CRESTOR ) 10 MG tablet TAKE 1 TABLET BY MOUTH DAILY 90 tablet 0   No current facility-administered medications on file prior to visit.   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   Complication of anesthesia    Loss of vocal/voice x 2 months   Dysrhythmia    Endometrial cancer (HCC) 11/02/2017   Headache    High cholesterol    History of radiation therapy 01/12/2018   11/14, 11/21, 11/27, 12/4, 01/12/18:  Vaginal cuff, 6 Gy in 5  fractions for a total dose of 30 Gy  Dr Retta Caster   Malignant neoplasm of overlapping sites of body of uterus (HCC) 12/29/2017   xrt 11/19   Obesity    Post-menopausal bleeding    Upper respiratory tract infection due to COVID-19 virus 02/24/2021   Past Surgical History:  Procedure Laterality Date   ATRIAL FIBRILLATION ABLATION N/A 05/15/2020   Procedure: ATRIAL FIBRILLATION ABLATION;  Surgeon: Lei Pump, MD;  Location: [facility];  Service: Cardiovascular;  Laterality: N/A;  ROBOTIC ASSISTED TOTAL HYSTERECTOMY WITH BILATERAL SALPINGO OOPHERECTOMY Bilateral 11/02/2017   Procedure: XI ROBOTIC ASSISTED TOTAL HYSTERECTOMY WITH BILATERAL SALPINGO OOPHORECTOMY;  Surgeon: Alphonso Aschoff, MD;  Location: [facility];  Service: Gynecology;  Laterality: Bilateral;   SENTINEL NODE BIOPSY N/A 11/02/2017   Procedure: SENTINEL NODE BIOPSY;  Surgeon: Alphonso Aschoff, MD;  Location: [facility];  Service: Gynecology;  Laterality: N/A;   TONSILLECTOMY     TOTAL HIP ARTHROPLASTY     TUBAL LIGATION      Family History  Problem Relation Age of Onset   Aneurysm Mother    Emphysema Father    Cancer Paternal Uncle    Breast cancer Neg Hx    Social History   Socioeconomic History   Marital status: Married    Spouse name: Jude Norton   Number of children: 2   Years of education: Not on file   Highest education level: Not on file  Occupational History   Not on file  Tobacco Use   Smoking status: Never   Smokeless tobacco: Never  Vaping Use   Vaping status: Never Used  Substance and Sexual Activity   Alcohol use: Not Currently    Comment: occasional social   Drug use: Never   Sexual activity: Not on file  Other Topics Concern   Not on file  Social History Narrative   2 children from previous marriage, 2 step children and many grandchildren    Social Drivers of Corporate investment banker Strain: Low Risk  (07/12/2023)   Overall Financial Resource Strain (CARDIA)    Difficulty of Paying Living  Expenses: Not hard at all  Food Insecurity: No Food Insecurity (07/12/2023)   Hunger Vital Sign    Worried About Running Out of Food in the Last Year: Never true    Ran Out of Food in the Last Year: Never true  Transportation Needs: No Transportation Needs (07/12/2023)   PRAPARE - Administrator, Civil Service (Medical): No    Lack of Transportation (Non-Medical): No  Physical Activity: Inactive (07/12/2023)   Exercise Vital Sign    Days of Exercise per Week: 0 days    Minutes of Exercise per Session: 0 min  Stress: No Stress Concern Present (07/12/2023)   [employer] of Occupational Health - Occupational Stress Questionnaire    Feeling of Stress : Not at all  Social Connections: Moderately Integrated (07/12/2023)   Social Connection and Isolation Panel [NHANES]    Frequency of Communication with Friends and Family: More than three times a week    Frequency of Social Gatherings with Friends and Family: Twice a week    Attends Religious Services: Never    Database administrator or Organizations: No    Attends Engineer, structural: More than 4 times per year    Marital Status: Married    Objective:  BP 132/80   Pulse 84   Temp 98.1 F (36.7 C)   Ht 5\' 2"  (1.575 m)   Wt 221 lb (100.2 kg)   SpO2 98%   BMI 40.42 kg/m      07/12/2023    8:55 AM 11/30/2022    3:22 PM 10/12/2022    8:52 AM  BP/Weight  Systolic BP 132 126 124  Diastolic BP 80 76 68  Wt. (Lbs) 221 238 240  BMI 40.42 kg/m2 43.53 kg/m2 42.51 kg/m2    Physical Exam Constitutional:      General: She is not in acute distress.    Appearance:  Normal appearance. She is not ill-appearing.  Eyes:     Conjunctiva/sclera: Conjunctivae normal.  Cardiovascular:     Rate and Rhythm: Normal rate and regular rhythm.     Heart sounds: Normal heart sounds. No murmur heard. Pulmonary:     Effort: Pulmonary effort is normal.     Breath sounds: Normal breath sounds. No wheezing.  Musculoskeletal:      Cervical back: Normal range of motion.     Right knee: Normal.     Left knee: Decreased range of motion.  Skin:    General: Skin is warm.  Neurological:     Mental Status: She is alert. Mental status is at baseline.  Psychiatric:        Mood and Affect: Mood normal.        Behavior: Behavior normal.     Lab Results  Component Value Date   WBC 9.3 09/30/2022   HGB 14.0 09/30/2022   HCT 40.7 09/30/2022   PLT 326 09/30/2022   GLUCOSE 103 (H) 10/12/2022   CHOL 178 10/12/2022   TRIG 104 10/12/2022   HDL 62 10/12/2022   LDLCALC 97 10/12/2022   ALT 15 10/12/2022   AST 17 10/12/2022   NA 136 10/12/2022   K 4.9 10/12/2022   CL 93 (L) 10/12/2022   CREATININE 0.75 10/12/2022   BUN 16 10/12/2022   CO2 27 10/12/2022   TSH 3.110 06/18/2022   HGBA1C 5.8 (H) 10/12/2022    Assessment & Plan:  Preoperative examination Assessment & Plan: Based on the following risk assessments below, the patient would be at acceptable risk for the planned low risk surgical procedure.  EKG done on 07/08/23 showed SB with arrhythmia - when compared to previous EKG no significant changes.  Cardiology cleared patient on 07/08/2023 without further cardiac testing - see attached note by Palmer Bobo, NP  Preoperative Mortality Predictor (PMP) Score from [email or web address]  on 07/12/2023 ** All calculations should be rechecked by clinician prior to use **  RESULT SUMMARY: 5 points PMP Score  Venice Gillis Perioperative Risk for Myocardial Infarction or Cardiac Arrest (MICA) from [email or web address]  on 07/12/2023 ** All calculations should be rechecked by clinician prior to use **  RESULT SUMMARY: 0.2 % Risk of myocardial infarction or cardiac arrest, intraoperatively or up to 30 days post-op   INPUTS: Age --> 68 years Functional status --> 0 = Independent ASA class --> -3.29 = 2: mild systemic disease Creatinine --> 0 = Normal (<=1.5 mg/dL, 161 mol/L)  0.1 % Risk of perioperative mortality   INPUTS: Inpatient --> 0  = No Sepsis --> 0 = No Poor functional status --> 0 = No Disseminated cancer --> 0 = No Age, years --> 1 = 70-79 Cardiac comorbidity --> 5 = Yes Pulmonary comorbidity --> 0 = No Renal comorbidity --> 0 = No Liver comorbidity --> 0 = No Steroids for chronic condition --> 0 = No Weight loss --> 0 = No Bleeding disorder --> 0 = No DNR status --> 0 = No Obesity --> -1 = Yes  Revised Cardiac Risk Index for Pre-Operative Risk from [email or web address]  on 07/12/2023 ** All calculations should be rechecked by clinician prior to use **  RESULT SUMMARY: 0 points RCRI Score  3.9 % Risk of major cardiac event   INPUTS: High-risk surgery --> 0 = No History of ischemic heart disease --> 0 = No History of congestive heart failure --> 0 = No History of cerebrovascular disease --> 0 = No Pre-operative treatment with insulin -->  0 = No Pre-operative creatinine >2 mg/dL / 161.0 mol/L --> 0 = No  Duke Activity Status Index (DASI) from [email or web address]  on 07/12/2023 ** All calculations should be rechecked by clinician prior to use **  RESULT SUMMARY: 23.2 points The higher the score (maximum 58.2), the higher the functional status.  5.59 METs    INPUTS: Take care of self --> 2.75 = Yes Walk indoors --> 1.75 = Yes Walk 1&ndash;2 blocks on level ground --> 2.75 = Yes Climb a flight of stairs or walk up a hill --> 0 = No Run a short distance --> 0 = No Do light work around the house --> 2.7 = Yes Do moderate work around the house --> 3.5 = Yes Do heavy work around the house --> 0 = No Do yardwork --> 4.5 = Yes Have sexual relations --> 5.25 = Yes Participate in moderate recreational activities --> 0 = No Participate in strenuous sports --> 0 = No     Gastroesophageal reflux disease without esophagitis Assessment & Plan: Reflux symptoms managed with nightly Protonix . 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 Previous treatments were ineffective. Surgery anticipated in four weeks, pending earlier availability. Advised to hold Xarelto  for three days prior to surgery. - Complete pre-operative paperwork and have Dr. Reinhold Carbine co-sign. - Fax completed paperwork to the surgical team. - Instruct to hold Xarelto  for three days prior to surgery.   Vitamin D deficiency Assessment & Plan: Vitamin D - 23.1 on 07/08/23 Significant deficiency managed with 50,000 units of vitamin D weekly.   Hyponatremia Assessment & Plan: Na - 132 on 6/5//25 Recent labs indicate low sodium levels. - Will recheck labs again in 3 months     Follow-up: Return in about 3 months (around 10/12/2023) for chronic.   Gladys Lamp I Leal-Borjas,acting as a scribe for Janece Means, FNP.,have documented all relevant documentation on the behalf of Janece Means, FNP,as directed by  Janece Means, FNP while in the presence of Janece Means, FNP.   An After Visit Summary was printed and given to the patient.  I attest that I have reviewed this visit and agree with the plan scribed by my staff.   Janece Means, FNP [name] [phone]

## 2023-07-12 NOTE — Assessment & Plan Note (Signed)
 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 Previous treatments were ineffective. Surgery anticipated in four weeks, pending earlier availability. Advised to hold Xarelto  for three days prior to surgery. - Complete pre-operative paperwork and have Dr. Reinhold Carbine co-sign. - Fax completed paperwork to the surgical team. - Instruct to hold Xarelto  for three days prior to surgery.

## 2023-07-12 NOTE — Assessment & Plan Note (Signed)
 Reflux symptoms managed with nightly Protonix . - Continue protonix  40 mg twice daily

## 2023-07-12 NOTE — Assessment & Plan Note (Signed)
 Na - 132 on 6/5//25 Recent labs indicate low sodium levels. - Will recheck labs again in 3 months

## 2023-07-12 NOTE — Assessment & Plan Note (Addendum)
 Based on the following risk assessments below, the patient would be at acceptable risk for the planned low risk surgical procedure.  EKG done on 07/08/23 showed SB with arrhythmia - when compared to previous EKG no significant changes.  Cardiology cleared patient on 07/08/2023 without further cardiac testing - see attached note by Palmer Bobo, NP  Preoperative Mortality Predictor (PMP) Score from [email or web address]  on 07/12/2023 ** All calculations should be rechecked by clinician prior to use **  RESULT SUMMARY: 5 points PMP Score  [facility] Perioperative Risk for Myocardial Infarction or Cardiac Arrest (MICA) from [email or web address]  on 07/12/2023 ** All calculations should be rechecked by clinician prior to use **  RESULT SUMMARY: 0.2 % Risk of myocardial infarction or cardiac arrest, intraoperatively or up to 30 days post-op   INPUTS: Age --> 68 years Functional status --> 0 = Independent ASA class --> -3.29 = 2: mild systemic disease Creatinine --> 0 = Normal (<=1.5 mg/dL, 119 mol/L)  0.1 % Risk of perioperative mortality   INPUTS: Inpatient --> 0 = No Sepsis --> 0 = No Poor functional status --> 0 = No Disseminated cancer --> 0 = No Age, years --> 1 = 70-79 Cardiac comorbidity --> 5 = Yes Pulmonary comorbidity --> 0 = No Renal comorbidity --> 0 = No Liver comorbidity --> 0 = No Steroids for chronic condition --> 0 = No Weight loss --> 0 = No Bleeding disorder --> 0 = No DNR status --> 0 = No Obesity --> -1 = Yes  Revised Cardiac Risk Index for Pre-Operative Risk from [email or web address]  on 07/12/2023 ** All calculations should be rechecked by clinician prior to use **  RESULT SUMMARY: 0 points RCRI Score  3.9 % Risk of major cardiac event   INPUTS: High-risk surgery --> 0 = No History of ischemic heart disease --> 0 = No History of congestive heart failure --> 0 = No History of cerebrovascular disease --> 0 = No Pre-operative treatment with insulin --> 0 = No Pre-operative  creatinine >2 mg/dL / 147.8 mol/L --> 0 = No  Duke Activity Status Index (DASI) from [email or web address]  on 07/12/2023 ** All calculations should be rechecked by clinician prior to use **  RESULT SUMMARY: 23.2 points The higher the score (maximum 58.2), the higher the functional status.  5.59 METs    INPUTS: Take care of self --> 2.75 = Yes Walk indoors --> 1.75 = Yes Walk 1&ndash;2 blocks on level ground --> 2.75 = Yes Climb a flight of stairs or walk up a hill --> 0 = No Run a short distance --> 0 = No Do light work around the house --> 2.7 = Yes Do moderate work around the house --> 3.5 = Yes Do heavy work around the house --> 0 = No Do yardwork --> 4.5 = Yes Have sexual relations --> 5.25 = Yes Participate in moderate recreational activities --> 0 = No Participate in strenuous sports --> 0 = No

## 2023-07-12 NOTE — Assessment & Plan Note (Signed)
 Vitamin D - 23.1 on 07/08/23 Significant deficiency managed with 50,000 units of vitamin D weekly.

## 2023-07-27 ENCOUNTER — Encounter: Payer: Self-pay | Admitting: Orthopedic Surgery

## 2023-07-28 ENCOUNTER — Other Ambulatory Visit: Payer: Self-pay

## 2023-07-28 DIAGNOSIS — I480 Paroxysmal atrial fibrillation: Secondary | ICD-10-CM

## 2023-07-29 ENCOUNTER — Ambulatory Visit: Payer: Self-pay | Admitting: Family Medicine

## 2023-08-03 ENCOUNTER — Other Ambulatory Visit: Payer: Self-pay | Admitting: Cardiology

## 2023-08-03 DIAGNOSIS — I480 Paroxysmal atrial fibrillation: Secondary | ICD-10-CM

## 2023-08-03 NOTE — Telephone Encounter (Signed)
 Prescription refill request for Xarelto  received.  Indication:afib Last office visit:10/24 Weight:100.2  kg Age:68 Scr:0.75  9/24 CrCl:115.01  ml/min  Prescription refilled

## 2023-08-05 ENCOUNTER — Ambulatory Visit: Payer: Self-pay | Admitting: Family Medicine

## 2023-08-05 DIAGNOSIS — M79609 Pain in unspecified limb: Secondary | ICD-10-CM | POA: Diagnosis not present

## 2023-08-05 DIAGNOSIS — Z79899 Other long term (current) drug therapy: Secondary | ICD-10-CM | POA: Diagnosis not present

## 2023-08-05 DIAGNOSIS — R52 Pain, unspecified: Secondary | ICD-10-CM | POA: Diagnosis not present

## 2023-08-12 DIAGNOSIS — I4891 Unspecified atrial fibrillation: Secondary | ICD-10-CM | POA: Diagnosis not present

## 2023-08-12 DIAGNOSIS — G8918 Other acute postprocedural pain: Secondary | ICD-10-CM | POA: Diagnosis not present

## 2023-08-12 DIAGNOSIS — I10 Essential (primary) hypertension: Secondary | ICD-10-CM | POA: Diagnosis not present

## 2023-08-12 DIAGNOSIS — G4733 Obstructive sleep apnea (adult) (pediatric): Secondary | ICD-10-CM | POA: Diagnosis not present

## 2023-08-12 DIAGNOSIS — K219 Gastro-esophageal reflux disease without esophagitis: Secondary | ICD-10-CM | POA: Diagnosis not present

## 2023-08-12 DIAGNOSIS — Z6837 Body mass index (BMI) 37.0-37.9, adult: Secondary | ICD-10-CM | POA: Diagnosis not present

## 2023-08-12 DIAGNOSIS — Z79899 Other long term (current) drug therapy: Secondary | ICD-10-CM | POA: Diagnosis not present

## 2023-08-12 DIAGNOSIS — M26609 Unspecified temporomandibular joint disorder, unspecified side: Secondary | ICD-10-CM | POA: Diagnosis not present

## 2023-08-12 DIAGNOSIS — Z7902 Long term (current) use of antithrombotics/antiplatelets: Secondary | ICD-10-CM | POA: Diagnosis not present

## 2023-08-12 DIAGNOSIS — M1712 Unilateral primary osteoarthritis, left knee: Secondary | ICD-10-CM | POA: Diagnosis not present

## 2023-08-13 DIAGNOSIS — Z96652 Presence of left artificial knee joint: Secondary | ICD-10-CM | POA: Diagnosis not present

## 2023-08-13 DIAGNOSIS — Z471 Aftercare following joint replacement surgery: Secondary | ICD-10-CM | POA: Diagnosis not present

## 2023-08-15 DIAGNOSIS — Z96641 Presence of right artificial hip joint: Secondary | ICD-10-CM | POA: Diagnosis not present

## 2023-08-15 DIAGNOSIS — Z7901 Long term (current) use of anticoagulants: Secondary | ICD-10-CM | POA: Diagnosis not present

## 2023-08-15 DIAGNOSIS — J342 Deviated nasal septum: Secondary | ICD-10-CM | POA: Diagnosis not present

## 2023-08-15 DIAGNOSIS — I480 Paroxysmal atrial fibrillation: Secondary | ICD-10-CM | POA: Diagnosis not present

## 2023-08-15 DIAGNOSIS — E66813 Obesity, class 3: Secondary | ICD-10-CM | POA: Diagnosis not present

## 2023-08-15 DIAGNOSIS — I10 Essential (primary) hypertension: Secondary | ICD-10-CM | POA: Diagnosis not present

## 2023-08-15 DIAGNOSIS — D649 Anemia, unspecified: Secondary | ICD-10-CM | POA: Diagnosis not present

## 2023-08-15 DIAGNOSIS — J309 Allergic rhinitis, unspecified: Secondary | ICD-10-CM | POA: Diagnosis not present

## 2023-08-15 DIAGNOSIS — E782 Mixed hyperlipidemia: Secondary | ICD-10-CM | POA: Diagnosis not present

## 2023-08-15 DIAGNOSIS — Z471 Aftercare following joint replacement surgery: Secondary | ICD-10-CM | POA: Diagnosis not present

## 2023-08-15 DIAGNOSIS — Z9181 History of falling: Secondary | ICD-10-CM | POA: Diagnosis not present

## 2023-08-15 DIAGNOSIS — E871 Hypo-osmolality and hyponatremia: Secondary | ICD-10-CM | POA: Diagnosis not present

## 2023-08-15 DIAGNOSIS — Z6839 Body mass index (BMI) 39.0-39.9, adult: Secondary | ICD-10-CM | POA: Diagnosis not present

## 2023-08-15 DIAGNOSIS — R1310 Dysphagia, unspecified: Secondary | ICD-10-CM | POA: Diagnosis not present

## 2023-08-15 DIAGNOSIS — D6869 Other thrombophilia: Secondary | ICD-10-CM | POA: Diagnosis not present

## 2023-08-15 DIAGNOSIS — Z96652 Presence of left artificial knee joint: Secondary | ICD-10-CM | POA: Diagnosis not present

## 2023-08-15 DIAGNOSIS — E559 Vitamin D deficiency, unspecified: Secondary | ICD-10-CM | POA: Diagnosis not present

## 2023-08-15 DIAGNOSIS — Z8542 Personal history of malignant neoplasm of other parts of uterus: Secondary | ICD-10-CM | POA: Diagnosis not present

## 2023-08-15 DIAGNOSIS — K219 Gastro-esophageal reflux disease without esophagitis: Secondary | ICD-10-CM | POA: Diagnosis not present

## 2023-08-17 ENCOUNTER — Other Ambulatory Visit: Payer: Self-pay | Admitting: Family Medicine

## 2023-08-17 DIAGNOSIS — E782 Mixed hyperlipidemia: Secondary | ICD-10-CM

## 2023-08-26 ENCOUNTER — Other Ambulatory Visit: Payer: Self-pay | Admitting: Family Medicine

## 2023-08-26 DIAGNOSIS — M25462 Effusion, left knee: Secondary | ICD-10-CM | POA: Diagnosis not present

## 2023-08-26 DIAGNOSIS — I10 Essential (primary) hypertension: Secondary | ICD-10-CM

## 2023-08-26 DIAGNOSIS — M25662 Stiffness of left knee, not elsewhere classified: Secondary | ICD-10-CM | POA: Diagnosis not present

## 2023-08-26 DIAGNOSIS — M25562 Pain in left knee: Secondary | ICD-10-CM | POA: Diagnosis not present

## 2023-08-26 DIAGNOSIS — R2689 Other abnormalities of gait and mobility: Secondary | ICD-10-CM | POA: Diagnosis not present

## 2023-09-01 DIAGNOSIS — M25562 Pain in left knee: Secondary | ICD-10-CM | POA: Diagnosis not present

## 2023-09-01 DIAGNOSIS — M25462 Effusion, left knee: Secondary | ICD-10-CM | POA: Diagnosis not present

## 2023-09-01 DIAGNOSIS — R2689 Other abnormalities of gait and mobility: Secondary | ICD-10-CM | POA: Diagnosis not present

## 2023-09-01 DIAGNOSIS — M25662 Stiffness of left knee, not elsewhere classified: Secondary | ICD-10-CM | POA: Diagnosis not present

## 2023-09-03 DIAGNOSIS — M25562 Pain in left knee: Secondary | ICD-10-CM | POA: Diagnosis not present

## 2023-09-03 DIAGNOSIS — M25462 Effusion, left knee: Secondary | ICD-10-CM | POA: Diagnosis not present

## 2023-09-03 DIAGNOSIS — R2689 Other abnormalities of gait and mobility: Secondary | ICD-10-CM | POA: Diagnosis not present

## 2023-09-03 DIAGNOSIS — M25662 Stiffness of left knee, not elsewhere classified: Secondary | ICD-10-CM | POA: Diagnosis not present

## 2023-09-08 DIAGNOSIS — M25662 Stiffness of left knee, not elsewhere classified: Secondary | ICD-10-CM | POA: Diagnosis not present

## 2023-09-08 DIAGNOSIS — M25562 Pain in left knee: Secondary | ICD-10-CM | POA: Diagnosis not present

## 2023-09-08 DIAGNOSIS — R2689 Other abnormalities of gait and mobility: Secondary | ICD-10-CM | POA: Diagnosis not present

## 2023-09-08 DIAGNOSIS — M25462 Effusion, left knee: Secondary | ICD-10-CM | POA: Diagnosis not present

## 2023-09-10 DIAGNOSIS — M25562 Pain in left knee: Secondary | ICD-10-CM | POA: Diagnosis not present

## 2023-09-10 DIAGNOSIS — R2689 Other abnormalities of gait and mobility: Secondary | ICD-10-CM | POA: Diagnosis not present

## 2023-09-10 DIAGNOSIS — M25462 Effusion, left knee: Secondary | ICD-10-CM | POA: Diagnosis not present

## 2023-09-10 DIAGNOSIS — M25662 Stiffness of left knee, not elsewhere classified: Secondary | ICD-10-CM | POA: Diagnosis not present

## 2023-09-15 DIAGNOSIS — M25562 Pain in left knee: Secondary | ICD-10-CM | POA: Diagnosis not present

## 2023-09-15 DIAGNOSIS — M25462 Effusion, left knee: Secondary | ICD-10-CM | POA: Diagnosis not present

## 2023-09-15 DIAGNOSIS — R2689 Other abnormalities of gait and mobility: Secondary | ICD-10-CM | POA: Diagnosis not present

## 2023-09-15 DIAGNOSIS — M25662 Stiffness of left knee, not elsewhere classified: Secondary | ICD-10-CM | POA: Diagnosis not present

## 2023-09-17 DIAGNOSIS — R2689 Other abnormalities of gait and mobility: Secondary | ICD-10-CM | POA: Diagnosis not present

## 2023-09-17 DIAGNOSIS — M25662 Stiffness of left knee, not elsewhere classified: Secondary | ICD-10-CM | POA: Diagnosis not present

## 2023-09-17 DIAGNOSIS — M25562 Pain in left knee: Secondary | ICD-10-CM | POA: Diagnosis not present

## 2023-09-17 DIAGNOSIS — M25462 Effusion, left knee: Secondary | ICD-10-CM | POA: Diagnosis not present

## 2023-09-20 DIAGNOSIS — M25662 Stiffness of left knee, not elsewhere classified: Secondary | ICD-10-CM | POA: Diagnosis not present

## 2023-09-20 DIAGNOSIS — R2689 Other abnormalities of gait and mobility: Secondary | ICD-10-CM | POA: Diagnosis not present

## 2023-09-20 DIAGNOSIS — M25562 Pain in left knee: Secondary | ICD-10-CM | POA: Diagnosis not present

## 2023-09-20 DIAGNOSIS — M25462 Effusion, left knee: Secondary | ICD-10-CM | POA: Diagnosis not present

## 2023-09-22 DIAGNOSIS — M25662 Stiffness of left knee, not elsewhere classified: Secondary | ICD-10-CM | POA: Diagnosis not present

## 2023-09-22 DIAGNOSIS — M25462 Effusion, left knee: Secondary | ICD-10-CM | POA: Diagnosis not present

## 2023-09-22 DIAGNOSIS — R2689 Other abnormalities of gait and mobility: Secondary | ICD-10-CM | POA: Diagnosis not present

## 2023-09-22 DIAGNOSIS — M25562 Pain in left knee: Secondary | ICD-10-CM | POA: Diagnosis not present

## 2023-09-27 DIAGNOSIS — M1712 Unilateral primary osteoarthritis, left knee: Secondary | ICD-10-CM | POA: Diagnosis not present

## 2023-09-29 ENCOUNTER — Other Ambulatory Visit: Payer: Self-pay | Admitting: Family Medicine

## 2023-09-29 DIAGNOSIS — M25562 Pain in left knee: Secondary | ICD-10-CM | POA: Diagnosis not present

## 2023-09-29 DIAGNOSIS — M25662 Stiffness of left knee, not elsewhere classified: Secondary | ICD-10-CM | POA: Diagnosis not present

## 2023-09-29 DIAGNOSIS — R2689 Other abnormalities of gait and mobility: Secondary | ICD-10-CM | POA: Diagnosis not present

## 2023-09-29 DIAGNOSIS — M25462 Effusion, left knee: Secondary | ICD-10-CM | POA: Diagnosis not present

## 2023-10-01 DIAGNOSIS — M25462 Effusion, left knee: Secondary | ICD-10-CM | POA: Diagnosis not present

## 2023-10-01 DIAGNOSIS — M25662 Stiffness of left knee, not elsewhere classified: Secondary | ICD-10-CM | POA: Diagnosis not present

## 2023-10-01 DIAGNOSIS — M25562 Pain in left knee: Secondary | ICD-10-CM | POA: Diagnosis not present

## 2023-10-01 DIAGNOSIS — R2689 Other abnormalities of gait and mobility: Secondary | ICD-10-CM | POA: Diagnosis not present

## 2023-10-06 DIAGNOSIS — M25562 Pain in left knee: Secondary | ICD-10-CM | POA: Diagnosis not present

## 2023-10-06 DIAGNOSIS — R2689 Other abnormalities of gait and mobility: Secondary | ICD-10-CM | POA: Diagnosis not present

## 2023-10-06 DIAGNOSIS — M25462 Effusion, left knee: Secondary | ICD-10-CM | POA: Diagnosis not present

## 2023-10-06 DIAGNOSIS — M25662 Stiffness of left knee, not elsewhere classified: Secondary | ICD-10-CM | POA: Diagnosis not present

## 2023-10-06 DIAGNOSIS — G4733 Obstructive sleep apnea (adult) (pediatric): Secondary | ICD-10-CM | POA: Diagnosis not present

## 2023-10-22 ENCOUNTER — Other Ambulatory Visit: Payer: Self-pay | Admitting: Family Medicine

## 2023-10-22 DIAGNOSIS — I480 Paroxysmal atrial fibrillation: Secondary | ICD-10-CM

## 2023-10-27 DIAGNOSIS — S8002XA Contusion of left knee, initial encounter: Secondary | ICD-10-CM | POA: Diagnosis not present

## 2023-10-27 DIAGNOSIS — M1712 Unilateral primary osteoarthritis, left knee: Secondary | ICD-10-CM | POA: Diagnosis not present

## 2023-10-29 ENCOUNTER — Ambulatory Visit: Admitting: Cardiology

## 2023-11-01 ENCOUNTER — Encounter: Payer: Self-pay | Admitting: Cardiology

## 2023-11-01 ENCOUNTER — Ambulatory Visit: Attending: Cardiology | Admitting: Cardiology

## 2023-11-01 VITALS — BP 118/82 | HR 68 | Ht 62.0 in | Wt 218.0 lb

## 2023-11-01 DIAGNOSIS — D6869 Other thrombophilia: Secondary | ICD-10-CM

## 2023-11-01 DIAGNOSIS — I4819 Other persistent atrial fibrillation: Secondary | ICD-10-CM

## 2023-11-01 DIAGNOSIS — I10 Essential (primary) hypertension: Secondary | ICD-10-CM

## 2023-11-01 DIAGNOSIS — G4733 Obstructive sleep apnea (adult) (pediatric): Secondary | ICD-10-CM

## 2023-11-01 NOTE — Progress Notes (Signed)
  Electrophysiology Office Note:   Date:  11/01/2023  ID:  Helen West, DOB [birth year], MRN [id number]  Primary Cardiologist: None Primary Heart Failure: None Electrophysiologist: Khoen Genet Gladis Norton, MD      History of Present Illness:   Helen West is a 68 y.o. female with h/o atrial fibrillation, hyperlipidemia, hypertension, obesity, sleep apnea seen today for routine electrophysiology followup.   Since last being seen in our clinic the patient reports doing well.  She had a recent knee replacement.  She did well with this procedure.  She has been getting somewhat dizzy when she stands up and also rolls over in bed.  She is wondering if this could be related to her chlorthalidone  or metoprolol .  Aside from that, she has noted no episodes of atrial fibrillation..  she denies chest pain, palpitations, dyspnea, PND, orthopnea, nausea, vomiting, dizziness, syncope, edema, weight gain, or early satiety.   Review of systems complete and found to be negative unless listed in HPI.   EP Information / Studies Reviewed:    EKG is not ordered today. EKG from 07/08/2023 reviewed which showed sinus rhythm      Risk Assessment/Calculations:    CHA2DS2-VASc Score = 3   This indicates a 3.2% annual risk of stroke. The patient's score is based upon: CHF History: 0 HTN History: 1 Diabetes History: 0 Stroke History: 0 Vascular Disease History: 0 Age Score: 1 Gender Score: 1            Physical Exam:   VS:  BP 118/82 (BP Location: Left Arm, Patient Position: Sitting, Cuff Size: Large)   Pulse 68   Ht 5' 2 (1.575 m)   Wt 218 lb (98.9 kg)   SpO2 97%   BMI 39.87 kg/m    Wt Readings from Last 3 Encounters:  11/01/23 218 lb (98.9 kg)  07/12/23 221 lb (100.2 kg)  11/30/22 238 lb (108 kg)     GEN: Well nourished, well developed in no acute distress NECK: No JVD; No carotid bruits CARDIAC: Regular rate and rhythm, no murmurs, rubs, gallops RESPIRATORY:  Clear to auscultation without  rales, wheezing or rhonchi  ABDOMEN: Soft, non-tender, non-distended EXTREMITIES:  No edema; No deformity   ASSESSMENT AND PLAN:    1.  Persistent atrial fibrillation: Post ablation 05/15/2020.  She has had 2 episodes of atrial fibrillation that occurred during high stress times in her life.  She has not had any atrial fibrillation since last being seen.  She does get somewhat dizzy.  She Micha Erck hold her metoprolol  for 1 week to see if this improves.  2.  Secondary hypercoagulable state: On Eliquis  3.  Hypertension: Well-controlled  4.  Obstructive sleep apnea: CPAP compliance encouraged  5.  Obesity: Lifestyle modification encouraged  Follow up with Dr. Norton 2 years   Signed, Ahlam Piscitelli Gladis Norton, MD

## 2023-11-11 ENCOUNTER — Other Ambulatory Visit: Payer: Self-pay

## 2023-11-11 ENCOUNTER — Encounter: Payer: Self-pay | Admitting: Family Medicine

## 2023-11-11 ENCOUNTER — Ambulatory Visit: Admitting: Family Medicine

## 2023-11-11 VITALS — BP 118/60 | HR 76 | Temp 97.8°F | Ht 62.0 in | Wt 220.0 lb

## 2023-11-11 DIAGNOSIS — J309 Allergic rhinitis, unspecified: Secondary | ICD-10-CM

## 2023-11-11 DIAGNOSIS — K219 Gastro-esophageal reflux disease without esophagitis: Secondary | ICD-10-CM | POA: Diagnosis not present

## 2023-11-11 DIAGNOSIS — Z9889 Other specified postprocedural states: Secondary | ICD-10-CM

## 2023-11-11 DIAGNOSIS — R7309 Other abnormal glucose: Secondary | ICD-10-CM | POA: Diagnosis not present

## 2023-11-11 DIAGNOSIS — I480 Paroxysmal atrial fibrillation: Secondary | ICD-10-CM

## 2023-11-11 DIAGNOSIS — E782 Mixed hyperlipidemia: Secondary | ICD-10-CM

## 2023-11-11 DIAGNOSIS — M545 Low back pain, unspecified: Secondary | ICD-10-CM | POA: Diagnosis not present

## 2023-11-11 DIAGNOSIS — I10 Essential (primary) hypertension: Secondary | ICD-10-CM

## 2023-11-11 DIAGNOSIS — E559 Vitamin D deficiency, unspecified: Secondary | ICD-10-CM | POA: Diagnosis not present

## 2023-11-11 DIAGNOSIS — G8929 Other chronic pain: Secondary | ICD-10-CM

## 2023-11-11 DIAGNOSIS — M25561 Pain in right knee: Secondary | ICD-10-CM

## 2023-11-11 DIAGNOSIS — L84 Corns and callosities: Secondary | ICD-10-CM

## 2023-11-11 DIAGNOSIS — G4733 Obstructive sleep apnea (adult) (pediatric): Secondary | ICD-10-CM

## 2023-11-11 LAB — POCT LIPID PANEL
HDL: 80
LDL: 98
Non-HDL: 116
TC: 196
TRG: 91

## 2023-11-11 LAB — POCT GLYCOSYLATED HEMOGLOBIN (HGB A1C): HbA1c POC (<> result, manual entry): 5.5 % (ref 4.0–5.6)

## 2023-11-11 MED ORDER — ALBUTEROL SULFATE HFA 108 (90 BASE) MCG/ACT IN AERS: 2.0000 | INHALATION_SPRAY | Freq: Four times a day (QID) | RESPIRATORY_TRACT | 2 refills | Status: AC | PRN

## 2023-11-11 NOTE — Assessment & Plan Note (Addendum)
 Continues on Xarelto  for anticoagulation.

## 2023-11-11 NOTE — Assessment & Plan Note (Addendum)
 Blood pressure controlled at 118/60 mmHg. Dizziness persists post-metoprolol  discontinuation.

## 2023-11-11 NOTE — Assessment & Plan Note (Addendum)
 Check A1c.

## 2023-11-11 NOTE — Assessment & Plan Note (Addendum)
 Previously low vitamin D treated. Currently on OTC vitamin D. Not taking calcium  supplements. - Provide educational material on osteoporosis prevention and calcium  intake. - Recommend calcium  supplementation or increased dietary calcium  intake. Orders:   CBC with Differential/Platelet   Comprehensive metabolic panel with GFR   VITAMIN D 25 Hydroxy (Vit-D Deficiency, Fractures)

## 2023-11-11 NOTE — Assessment & Plan Note (Addendum)
 Cholesterol levels improved. Total cholesterol 196 mg/dL, HDL 80 mg/dL, triglycerides 91 mg/dL, LDL 98 mg/dL. HDL increase favorable. Orders:   TSH

## 2023-11-11 NOTE — Patient Instructions (Addendum)
  VISIT SUMMARY: Today, we discussed your ongoing health issues, including dizziness, knee and back pain, allergic rhinitis, and your recent lab results. We also reviewed your medications and made some recommendations for osteoporosis prevention and general health maintenance.  YOUR PLAN: DIZZINESS: You have been experiencing dizziness, especially when turning over in bed or with certain movements. -Continue monitoring your symptoms. If dizziness persists or worsens, please let us  know.  RIGHT KNEE PAIN, STATUS POST SURGERY: You had a recent fall on your knee, but there was no damage on the X-ray. Your pain is currently managed with Tylenol . -Continue taking Tylenol  as needed for pain.  LOW BACK PAIN: You have been experiencing low back pain, which is currently managed with Tylenol . -Continue taking Tylenol  twice daily to manage your pain and improve mobility.  ALLERGIC RHINITIS: You have a stuffy nose in the morning that resolves after blowing your nose. You use Flonase  and an albuterol  inhaler during allergy season. -Continue using Flonase  and your albuterol  inhaler as needed.  MIXED HYPERLIPIDEMIA: Your cholesterol levels have improved, with favorable increases in HDL and decreases in triglycerides. -Continue with your current diet and exercise routine to maintain your cholesterol levels.  ESSENTIAL HYPERTENSION: Your blood pressure is well-controlled at 118/60 mmHg. -Continue taking losartan  100 mg daily and chlorthalidone  25 mg daily.  ATRIAL FIBRILLATION: You are taking Xarelto  for anticoagulation. -Continue taking Xarelto  as prescribed.  GASTROESOPHAGEAL REFLUX DISEASE: You are taking pantoprazole  40 mg daily for reflux. -Continue taking pantoprazole  40 mg daily.  OSTEOPOROSIS PREVENTION: You were previously treated for low vitamin D and are currently taking an over-the-counter vitamin D supplement. -Consider taking calcium  supplements or increasing your dietary calcium  intake. We  will provide you with educational material on osteoporosis prevention and calcium  intake.  FOOT CALLUS: You have a callus on your big toe causing discomfort. -Soak your feet and use a pumice stone to manage the callus. Address this during your next nail care appointment.  GENERAL HEALTH MAINTENANCE: You are due for a tetanus vaccine and agreed to a flu shot. -Obtain your tetanus vaccine at the pharmacy. -We will administer your flu shot today.  COLONOSCOPY SURVEILLANCE: Your last colonoscopy in 2018 showed an adenoma. -We will plan for your next colonoscopy in 2028.                      Contains text generated by [employer]bridge.                                 Contains text generated by [employer]bridge.

## 2023-11-11 NOTE — Progress Notes (Signed)
 Patient returning at 11 a.m. for chronic follow up. Was fasting at time of her husbands appointment, requested cholesterol and A1c be checked prior to eating. Ok per Dr. Sherre.

## 2023-11-11 NOTE — Progress Notes (Signed)
 Subjective:  Patient ID: Helen West, female    DOB: January 07, 1956  Age: 68 y.o. MRN: [id number]  Chief Complaint  Patient presents with   Medical Management of Chronic Issues    HPI: Discussed the use of AI scribe software for clinical note transcription with the patient, who gave verbal consent to proceed.  History of Present Illness Helen West is a 68 year old female with atrial fibrillation and hypertension who presents for a follow-up visit.  Dizziness - Dizziness occurs particularly when turning over in bed or with certain movements - Symptom persists despite recent discontinuation of metoprolol  - No falls related to dizziness, except for one incident at the [facility] due to a wet floor, which did not affect her recently operated knee  Pain and mobility - Takes Tylenol  twice daily for lower back and knee pain, which improves mobility - Released from physical therapy - Exercises at the [facility] three times a week, including cycling - Weight loss has improved mobility and clothing fit - No recent injury to the knee after the fall at the [facility]  Allergic rhinitis - Stuffy nose only in the morning, resolves after blowing her nose - Uses Flonase  for allergies - Uses albuterol  inhaler two to three times a week during allergy season  Hyperlipidemia - Total cholesterol 196 mg/dL - HDL increased to 80 mg/dL - Triglycerides decreased to 91 mg/dL - LDL stable at 98 mg/dL - Pleased with lipid panel results - on rosuvastatin  10 mg before bed.   Hypertension and atrial fibrillation - Takes losartan  100 mg daily and chlorthalidone  25 mg daily for blood pressure control - Takes Xarelto  for atrial fibrillation  Gastroesophageal reflux disease - Takes pantoprazole  40 mg daily for reflux  Vitamin d deficiency - Previously prescribed high-dose vitamin D after low level detected post-hospitalization in July - Currently takes over-the-counter vitamin D supplement - Does not take calcium   supplements - Consumes dairy and calcium -fortified foods occasionally  Constitutional and psychiatric symptoms - No fevers, chills, sweats, chest pain, shortness of breath, depression, or loss of interest in activities       10/12/2022    8:58 AM 06/22/2022    1:30 PM 06/18/2022    8:00 AM 05/26/2022    9:08 AM 12/09/2021   10:26 AM  Depression screen PHQ 2/9  Decreased Interest 0 0 0 0 0  Down, Depressed, Hopeless 0 0 0 0 0  PHQ - 2 Score 0 0 0 0 0  Altered sleeping 0 0     Tired, decreased energy 0 0     Change in appetite 0 0     Feeling bad or failure about yourself  0 0     Trouble concentrating 0 0     Moving slowly or fidgety/restless 0 0     Suicidal thoughts 0 0     PHQ-9 Score 0 0     Difficult doing work/chores Not difficult at all Not difficult at all           10/12/2022    8:57 AM  Fall Risk   Falls in the past year? 0  Number falls in past yr: 0  Injury with Fall? 0  Risk for fall due to : Impaired mobility  Follow up Falls evaluation completed;Falls prevention discussed    Patient Care Team: Sherre Clapper, MD as PCP - General (Family Medicine) Inocencio Soyla Lunger, MD as PCP - Electrophysiology (Cardiology) Shlomo Wilbert SAUNDERS, MD as PCP - Sleep Medicine (Cardiology)  Delores Lauraine NOVAK, RPH (Inactive) as Pharmacist (Pharmacist) Delores Lauraine NOVAK, [facility] (Inactive) as Pharmacist (Pharmacist) Shannon Agent, MD as Consulting Physician (Radiation Oncology)   Review of Systems  All other systems reviewed and are negative.   Current Outpatient Medications on File Prior to Visit  Medication Sig Dispense Refill   acetaminophen  (TYLENOL ) 650 MG CR tablet Take 1,950 mg by mouth in the morning and at bedtime.     cholecalciferol (VITAMIN D3) 25 MCG (1000 UNIT) tablet Take 1,000 Units by mouth daily.     fluticasone  (FLONASE ) 50 MCG/ACT nasal spray Place 2 sprays into both nostrils daily. 16 g 6   HYDROcodone  bit-homatropine (HYDROMET) 5-1.5 MG/5ML syrup Take 5 mLs by mouth every  6 (six) hours as needed for cough. 120 mL 0   losartan  (COZAAR ) 100 MG tablet TAKE 1 TABLET BY MOUTH DAILY. 90 tablet 0   pantoprazole  (PROTONIX ) 40 MG tablet TAKE 1 TABLET BY MOUTH DAILY. 90 tablet 0   prochlorperazine  (COMPAZINE ) 10 MG tablet Take 1 tablet (10 mg total) by mouth 2 (two) times daily as needed for nausea or vomiting. 10 tablet 0   Respiratory Therapy Supplies (CARETOUCH CPAP & BIPAP HOSE) MISC by Does not apply route.     rivaroxaban  (XARELTO ) 20 MG TABS tablet TAKE 1 TABLET ONCE DAILY WITH SUPPER 90 tablet 1   No current facility-administered medications on file prior to visit.   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   Complication of anesthesia    Loss of vocal/voice x 2 months   Dysrhythmia    Endometrial cancer (HCC) 11/02/2017   Headache    High cholesterol    History of radiation therapy 01/12/2018   11/14, 11/21, 11/27, 12/4, 01/12/18:  Vaginal cuff, 6 Gy in 5 fractions for a total dose of 30 Gy  Dr Agent Shannon   Malignant neoplasm of overlapping sites of body of uterus (HCC) 12/29/2017   xrt 11/19   Obesity    Post-menopausal bleeding    Upper respiratory tract infection due to COVID-19 virus 02/24/2021   Past Surgical History:  Procedure Laterality Date   ATRIAL FIBRILLATION ABLATION N/A 05/15/2020   Procedure: ATRIAL FIBRILLATION ABLATION;  Surgeon: Inocencio Soyla Lunger, MD;  Location: [facility];  Service: Cardiovascular;  Laterality: N/A;   ROBOTIC ASSISTED TOTAL HYSTERECTOMY WITH BILATERAL SALPINGO OOPHERECTOMY Bilateral 11/02/2017   Procedure: XI ROBOTIC ASSISTED TOTAL HYSTERECTOMY WITH BILATERAL SALPINGO OOPHORECTOMY;  Surgeon: Eloy Herring, MD;  Location: [facility];  Service: Gynecology;  Laterality: Bilateral;   SENTINEL NODE BIOPSY N/A 11/02/2017   Procedure: SENTINEL NODE BIOPSY;  Surgeon: Eloy Herring, MD;  Location: [facility];   Service: Gynecology;  Laterality: N/A;   TONSILLECTOMY     TOTAL HIP ARTHROPLASTY     TUBAL LIGATION      Family History  Problem Relation Age of Onset   Aneurysm Mother    Emphysema Father    Cancer Paternal Uncle    Breast cancer Neg Hx    Social History   Socioeconomic History   Marital status: Married    Spouse name: Mallissa   Number of children: 2   Years of education: Not on file   Highest education level: Not on file  Occupational History   Not on file  Tobacco Use   Smoking status: Never   Smokeless tobacco: Never  Vaping Use   Vaping status: Never Used  Substance and Sexual Activity   Alcohol use: Not Currently    Comment: occasional social   Drug use: Never   Sexual activity: Not on file  Other Topics Concern   Not on file  Social History Narrative   2 children from previous marriage, 2 step children and many grandchildren    Social Drivers of Corporate investment banker Strain: Low Risk  (07/12/2023)   Overall Financial Resource Strain (CARDIA)    Difficulty of Paying Living Expenses: Not hard at all  Food Insecurity: No Food Insecurity (07/12/2023)   Hunger Vital Sign    Worried About Running Out of Food in the Last Year: Never true    Ran Out of Food in the Last Year: Never true  Transportation Needs: No Transportation Needs (07/12/2023)   PRAPARE - Administrator, Civil Service (Medical): No    Lack of Transportation (Non-Medical): No  Physical Activity: Inactive (07/12/2023)   Exercise Vital Sign    Days of Exercise per Week: 0 days    Minutes of Exercise per Session: 0 min  Stress: No Stress Concern Present (07/12/2023)   [employer] of Occupational Health - Occupational Stress Questionnaire    Feeling of Stress : Not at all  Social Connections: Moderately Integrated (07/12/2023)   Social Connection and Isolation Panel    Frequency of Communication with Friends and Family: More than three times a week    Frequency of Social Gatherings with  Friends and Family: Twice a week    Attends Religious Services: Never    Diplomatic Services operational officer: No    Attends Engineer, structural: More than 4 times per year    Marital Status: Married    Objective:  BP 118/60   Pulse 76   Temp 97.8 F (36.6 C)   Ht 5' 2 (1.575 m)   Wt 220 lb (99.8 kg)   SpO2 94%   BMI 40.24 kg/m      11/11/2023   10:56 AM 11/01/2023    1:44 PM 07/12/2023    8:55 AM  BP/Weight  Systolic BP 118 118 132  Diastolic BP 60 82 80  Wt. (Lbs) 220 218 221  BMI 40.24 kg/m2 39.87 kg/m2 40.42 kg/m2    Physical Exam Vitals reviewed.  Constitutional:      Appearance: Normal appearance. She is obese.  Neck:     Vascular: No carotid bruit.  Cardiovascular:     Rate and Rhythm: Normal rate and regular rhythm.     Heart sounds: Normal heart sounds.  Pulmonary:     Effort: Pulmonary effort is normal. No respiratory distress.     Breath sounds: Normal breath sounds.  Abdominal:     General: Abdomen is flat. Bowel sounds are normal.     Palpations: Abdomen is soft.     Tenderness: There is no abdominal tenderness.  Neurological:     Mental Status: She is alert and oriented to person, place, and time.  Psychiatric:        Mood and Affect: Mood normal.        Behavior: Behavior normal.      Diabetic foot exam was performed with the following findings:   Normal sensation of 10g monofilament Intact posterior tibialis and dorsalis pedis pulses Calluses feet.         Assessment & Plan:   Assessment & Plan Mixed hyperlipidemia Cholesterol levels improved. Total cholesterol 196  mg/dL, HDL 80 mg/dL, triglycerides 91 mg/dL, LDL 98 mg/dL. HDL increase favorable. Continue rosuvastatin  10 mg before bed.  Orders:   TSH  Elevated glucose Check A1c 5.5     Primary hypertension Blood pressure controlled at 118/60 mmHg. Dizziness persists post-metoprolol  discontinuation.    PAF (paroxysmal atrial fibrillation) (HCC) Continues on  Xarelto  for anticoagulation.    Vitamin D deficiency Previously low vitamin D treated. Currently on OTC vitamin D. Not taking calcium  supplements. - Provide educational material on osteoporosis prevention and calcium  intake. - Recommend calcium  supplementation or increased dietary calcium  intake.  Orders:   CBC with Differential/Platelet   Comprehensive metabolic panel with GFR   VITAMIN D 25 Hydroxy (Vit-D Deficiency, Fractures)  Chronic pain of right knee Recent fall on knee, no damage on X-ray. Pain managed with Tylenol .    Chronic midline low back pain without sciatica Managed with Tylenol  twice daily, improves mobility.    Chronic allergic rhinitis Uses albuterol  inhaler 2-3 times a week during allergy season. Symptoms not severe.    Gastroesophageal reflux disease without esophagitis Continues on pantoprazole  40 mg once daily.    Foot callus Callus on big toe causing discomfort, no pain. - Recommend soaking feet and using a pumice stone. - Address callus during nail care appointment.      Body mass index is 40.24 kg/m.    Meds ordered this encounter  Medications   albuterol  (VENTOLIN  HFA) 108 (90 Base) MCG/ACT inhaler    Sig: Inhale 2 puffs into the lungs every 6 (six) hours as needed for wheezing or shortness of breath.    Dispense:  8 g    Refill:  2    Orders Placed This Encounter  Procedures   CBC with Differential/Platelet   Comprehensive metabolic panel with GFR   VITAMIN D 25 Hydroxy (Vit-D Deficiency, Fractures)   TSH     I,Marla I Leal-Borjas,acting as a scribe for Abigail Free, MD.,have documented all relevant documentation on the behalf of Abigail Free, MD,as directed by  Abigail Free, MD while in the presence of Abigail Free, MD.   Follow-up: Return in about 6 months (around 05/11/2024).  An After Visit Summary was printed and given to the patient.  I attest that I have reviewed this visit and agree with the plan scribed by my  staff.   Abigail Free, MD [facility] [phone]

## 2023-11-12 ENCOUNTER — Ambulatory Visit: Payer: Self-pay | Admitting: Family Medicine

## 2023-11-12 LAB — CBC WITH DIFFERENTIAL/PLATELET
Basophils Absolute: 0 x10E3/uL (ref 0.0–0.2)
Basos: 1 %
EOS (ABSOLUTE): 0.2 x10E3/uL (ref 0.0–0.4)
Eos: 3 %
Hematocrit: 41 % (ref 34.0–46.6)
Hemoglobin: 13.3 g/dL (ref 11.1–15.9)
Immature Grans (Abs): 0 x10E3/uL (ref 0.0–0.1)
Immature Granulocytes: 0 %
Lymphocytes Absolute: 1.8 x10E3/uL (ref 0.7–3.1)
Lymphs: 29 %
MCH: 31 pg (ref 26.6–33.0)
MCHC: 32.4 g/dL (ref 31.5–35.7)
MCV: 96 fL (ref 79–97)
Monocytes Absolute: 0.5 x10E3/uL (ref 0.1–0.9)
Monocytes: 8 %
Neutrophils Absolute: 3.7 x10E3/uL (ref 1.4–7.0)
Neutrophils: 59 %
Platelets: 332 x10E3/uL (ref 150–450)
RBC: 4.29 x10E6/uL (ref 3.77–5.28)
RDW: 12.6 % (ref 11.7–15.4)
WBC: 6.2 x10E3/uL (ref 3.4–10.8)

## 2023-11-12 LAB — COMPREHENSIVE METABOLIC PANEL WITH GFR
ALT: 13 IU/L (ref 0–32)
AST: 16 IU/L (ref 0–40)
Albumin: 4.6 g/dL (ref 3.9–4.9)
Alkaline Phosphatase: 85 IU/L (ref 49–135)
BUN/Creatinine Ratio: 24 (ref 12–28)
BUN: 18 mg/dL (ref 8–27)
Bilirubin Total: 0.3 mg/dL (ref 0.0–1.2)
CO2: 25 mmol/L (ref 20–29)
Calcium: 9.7 mg/dL (ref 8.7–10.3)
Chloride: 95 mmol/L — ABNORMAL LOW (ref 96–106)
Creatinine, Ser: 0.75 mg/dL (ref 0.57–1.00)
Globulin, Total: 2.3 g/dL (ref 1.5–4.5)
Glucose: 110 mg/dL — ABNORMAL HIGH (ref 70–99)
Potassium: 4.6 mmol/L (ref 3.5–5.2)
Sodium: 137 mmol/L (ref 134–144)
Total Protein: 6.9 g/dL (ref 6.0–8.5)
eGFR: 86 mL/min/1.73 (ref >59)

## 2023-11-12 LAB — VITAMIN D 25 HYDROXY (VIT D DEFICIENCY, FRACTURES): Vit D, 25-Hydroxy: 37.4 ng/mL (ref 30.0–100.0)

## 2023-11-12 LAB — TSH: TSH: 1.92 u[IU]/mL (ref 0.450–4.500)

## 2023-11-14 DIAGNOSIS — L84 Corns and callosities: Secondary | ICD-10-CM | POA: Insufficient documentation

## 2023-11-14 DIAGNOSIS — Z9889 Other specified postprocedural states: Secondary | ICD-10-CM | POA: Insufficient documentation

## 2023-11-14 DIAGNOSIS — G4733 Obstructive sleep apnea (adult) (pediatric): Secondary | ICD-10-CM | POA: Insufficient documentation

## 2023-11-14 NOTE — Assessment & Plan Note (Signed)
 Uses albuterol  inhaler 2-3 times a week during allergy season. Symptoms not severe.

## 2023-11-14 NOTE — Assessment & Plan Note (Signed)
 Callus on big toe causing discomfort, no pain. - Recommend soaking feet and using a pumice stone. - Address callus during nail care appointment.

## 2023-11-14 NOTE — Assessment & Plan Note (Signed)
 Adherent to CPAP therapy

## 2023-11-14 NOTE — Assessment & Plan Note (Signed)
 Last colonoscopy in 2018 showed adenoma. Repeat in 7-10 years. - Schedule colonoscopy for 2028.

## 2023-11-14 NOTE — Assessment & Plan Note (Signed)
 Continues on pantoprazole  40 mg once daily.

## 2023-11-15 ENCOUNTER — Other Ambulatory Visit: Payer: Self-pay | Admitting: Family Medicine

## 2023-11-15 DIAGNOSIS — I10 Essential (primary) hypertension: Secondary | ICD-10-CM

## 2023-11-15 DIAGNOSIS — M545 Low back pain, unspecified: Secondary | ICD-10-CM | POA: Insufficient documentation

## 2023-11-15 DIAGNOSIS — E782 Mixed hyperlipidemia: Secondary | ICD-10-CM

## 2023-11-15 NOTE — Assessment & Plan Note (Addendum)
 Recent fall on knee, no damage on X-ray. Pain managed with Tylenol .

## 2023-11-15 NOTE — Assessment & Plan Note (Addendum)
 Managed with Tylenol  twice daily, improves mobility.

## 2023-12-01 ENCOUNTER — Other Ambulatory Visit: Payer: Self-pay | Admitting: Family Medicine

## 2023-12-01 DIAGNOSIS — I10 Essential (primary) hypertension: Secondary | ICD-10-CM

## 2023-12-16 ENCOUNTER — Ambulatory Visit

## 2023-12-22 ENCOUNTER — Other Ambulatory Visit: Payer: Self-pay | Admitting: Family Medicine

## 2024-01-19 ENCOUNTER — Other Ambulatory Visit: Payer: Self-pay | Admitting: Family Medicine

## 2024-01-19 DIAGNOSIS — I480 Paroxysmal atrial fibrillation: Secondary | ICD-10-CM

## 2024-01-31 ENCOUNTER — Encounter: Payer: Self-pay | Admitting: Family Medicine

## 2024-01-31 ENCOUNTER — Ambulatory Visit: Admitting: Family Medicine

## 2024-01-31 VITALS — BP 134/68 | HR 72 | Temp 97.8°F | Ht 62.0 in | Wt 230.0 lb

## 2024-01-31 DIAGNOSIS — J309 Allergic rhinitis, unspecified: Secondary | ICD-10-CM | POA: Diagnosis not present

## 2024-01-31 DIAGNOSIS — Z791 Long term (current) use of non-steroidal anti-inflammatories (NSAID): Secondary | ICD-10-CM | POA: Insufficient documentation

## 2024-01-31 DIAGNOSIS — J00 Acute nasopharyngitis [common cold]: Secondary | ICD-10-CM | POA: Insufficient documentation

## 2024-01-31 DIAGNOSIS — J0100 Acute maxillary sinusitis, unspecified: Secondary | ICD-10-CM

## 2024-01-31 LAB — POCT INFLUENZA A/B
Influenza A, POC: NEGATIVE
Influenza B, POC: NEGATIVE

## 2024-01-31 LAB — POC COVID19 BINAXNOW: SARS Coronavirus 2 Ag: NEGATIVE

## 2024-01-31 MED ORDER — FLUTICASONE PROPIONATE 50 MCG/ACT NA SUSP: 2.0000 | Freq: Every day | NASAL | 11 refills | Status: AC

## 2024-01-31 MED ORDER — AZITHROMYCIN 250 MG PO TABS: ORAL_TABLET | ORAL | 0 refills | Status: AC

## 2024-01-31 NOTE — Assessment & Plan Note (Signed)
 Recommend regular use of flonase .  Orders:   fluticasone  (FLONASE ) 50 MCG/ACT nasal spray; Place 2 sprays into both nostrils daily.

## 2024-01-31 NOTE — Progress Notes (Signed)
 "  Acute Office Visit  Subjective:    Patient ID: Helen West, female    DOB: 1955/03/18, 68 y.o.   MRN: [id number]  Chief Complaint  Patient presents with   URI    Sore throat, congested, runny nose, fatigue. Denies fever, chills. Started 3 days ago.    Discussed the use of AI scribe software for clinical note transcription with the patient, who gave verbal consent to proceed.  History of Present Illness Helen West is a 68 year old female who presents with upper respiratory symptoms.  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   Complication of anesthesia    Loss of vocal/voice x 2 months   Dysrhythmia    Endometrial cancer (HCC) 11/02/2017   Headache    High cholesterol    History of radiation therapy 01/12/2018   11/14, 11/21, 11/27, 12/4, 01/12/18:  Vaginal cuff, 6 Gy in 5 fractions for a total dose of 30 Gy  Dr Lynwood Nasuti   Malignant neoplasm of overlapping sites of body of uterus (HCC) 12/29/2017   xrt 11/19   Obesity    Post-menopausal bleeding    Upper respiratory tract infection due to COVID-19 virus 02/24/2021    Past Surgical History:  Procedure Laterality Date   ATRIAL FIBRILLATION ABLATION N/A 05/15/2020   Procedure: ATRIAL FIBRILLATION ABLATION;  Surgeon: Inocencio Soyla Lunger, MD;  Location: [facility];  Service: Cardiovascular;   Laterality: N/A;   ROBOTIC ASSISTED TOTAL HYSTERECTOMY WITH BILATERAL SALPINGO OOPHERECTOMY Bilateral 11/02/2017   Procedure: XI ROBOTIC ASSISTED TOTAL HYSTERECTOMY WITH BILATERAL SALPINGO OOPHORECTOMY;  Surgeon: Eloy Herring, MD;  Location: [facility];  Service: Gynecology;  Laterality: Bilateral;   SENTINEL NODE BIOPSY N/A 11/02/2017   Procedure: SENTINEL NODE BIOPSY;  Surgeon: Eloy Herring, MD;  Location: [facility];  Service: Gynecology;  Laterality: N/A;   TONSILLECTOMY     TOTAL HIP ARTHROPLASTY     TUBAL LIGATION      Family History  Problem Relation Age of Onset   Aneurysm Mother    Emphysema Father    Cancer Paternal Uncle    Breast cancer Neg Hx     Social History   Socioeconomic History   Marital status: Married    Spouse name: Mallissa   Number of children: 2   Years of education: Not on file   Highest education level: Not on file  Occupational History   Not on file  Tobacco Use   Smoking status: Never   Smokeless tobacco: Never  Vaping Use   Vaping status: Never Used  Substance and Sexual Activity   Alcohol use: Not Currently    Comment: occasional social   Drug use: Never   Sexual activity: Not on file  Other Topics Concern   Not on file  Social History Narrative   2 children from previous marriage, 2  step children and many grandchildren    Social Drivers of Health   Tobacco Use: Low Risk (01/31/2024)   Patient History    Smoking Tobacco Use: Never    Smokeless Tobacco Use: Never    Passive Exposure: Not on file  Financial Resource Strain: Low Risk (07/12/2023)   Overall Financial Resource Strain (CARDIA)    Difficulty of Paying Living Expenses: Not hard at all  Food Insecurity: No Food Insecurity (07/12/2023)   Hunger Vital Sign    Worried About Running Out of Food in the Last Year: Never true    Ran Out of Food in the Last Year: Never true  Transportation Needs: No Transportation Needs (07/12/2023)   PRAPARE - Administrator, Civil Service (Medical): No     Lack of Transportation (Non-Medical): No  Physical Activity: Inactive (07/12/2023)   Exercise Vital Sign    Days of Exercise per Week: 0 days    Minutes of Exercise per Session: 0 min  Stress: No Stress Concern Present (07/12/2023)   [employer] of Occupational Health - Occupational Stress Questionnaire    Feeling of Stress : Not at all  Social Connections: Moderately Integrated (07/12/2023)   Social Connection and Isolation Panel    Frequency of Communication with Friends and Family: More than three times a week    Frequency of Social Gatherings with Friends and Family: Twice a week    Attends Religious Services: Never    Database Administrator or Organizations: No    Attends Engineer, Structural: More than 4 times per year    Marital Status: Married  Catering Manager Violence: Not At Risk (07/12/2023)   Humiliation, Afraid, Rape, and Kick questionnaire    Fear of Current or Ex-Partner: No    Emotionally Abused: No    Physically Abused: No    Sexually Abused: No  Depression (PHQ2-9): Low Risk (01/31/2024)   Depression (PHQ2-9)    PHQ-2 Score: 0  Alcohol Screen: Low Risk (07/12/2023)   Alcohol Screen    Last Alcohol Screening Score (AUDIT): 0  Housing: Low Risk (07/12/2023)   Housing Stability Vital Sign    Unable to Pay for Housing in the Last Year: No    Number of Times Moved in the Last Year: 0    Homeless in the Last Year: No  Utilities: Not At Risk (07/12/2023)   AHC Utilities    Threatened with loss of utilities: No  Health Literacy: Adequate Health Literacy (07/12/2023)   B1300 Health Literacy    Frequency of need for help with medical instructions: Never    Outpatient Medications Prior to Visit  Medication Sig Dispense Refill   albuterol  (VENTOLIN  HFA) 108 (90 Base) MCG/ACT inhaler Inhale 2 puffs into the lungs every 6 (six) hours as needed for wheezing or shortness of breath. 8 g 2   chlorthalidone  (HYGROTON ) 25 MG tablet TAKE 1 TABLET BY MOUTH ONCE DAILY. 90  tablet 0   cholecalciferol (VITAMIN D3) 25 MCG (1000 UNIT) tablet Take 1,000 Units by mouth daily.     HYDROcodone  bit-homatropine (HYDROMET) 5-1.5 MG/5ML syrup Take 5 mLs by mouth every 6 (six) hours as needed for cough. 120 mL 0   losartan  (COZAAR ) 100 MG tablet TAKE 1 TABLET BY MOUTH DAILY. 90 tablet 0   pantoprazole  (PROTONIX ) 40 MG tablet TAKE 1 TABLET BY MOUTH DAILY. 90 tablet 0   prochlorperazine  (COMPAZINE ) 10 MG tablet Take 1 tablet (10 mg total) by mouth 2 (two) times daily as  needed for nausea or vomiting. 10 tablet 0   Respiratory Therapy Supplies (CARETOUCH CPAP & BIPAP HOSE) MISC by Does not apply route.     rivaroxaban  (XARELTO ) 20 MG TABS tablet TAKE 1 TABLET ONCE DAILY WITH SUPPER 90 tablet 1   rosuvastatin  (CRESTOR ) 10 MG tablet TAKE 1 TABLET BY MOUTH DAILY 90 tablet 0   acetaminophen  (TYLENOL ) 650 MG CR tablet Take 1,950 mg by mouth in the morning and at bedtime.     fluticasone  (FLONASE ) 50 MCG/ACT nasal spray Place 2 sprays into both nostrils daily. 16 g 6   No facility-administered medications prior to visit.    Allergies[1]  Review of Systems  Constitutional:  Positive for fatigue. Negative for chills and fever.  HENT:  Positive for congestion, rhinorrhea and sore throat.   Respiratory:  Negative for cough and shortness of breath.   Cardiovascular:  Negative for chest pain.       Objective:        01/31/2024    9:05 AM 11/11/2023   10:56 AM 11/01/2023    1:44 PM  Vitals with BMI  Height 5' 2 5' 2 5' 2  Weight 230 lbs 220 lbs 218 lbs  BMI 42.06 40.23 39.86  Systolic 134 118 881  Diastolic 68 60 82  Pulse 72 76 68    No data found.   Physical Exam  Health Maintenance Due  Topic Date Due   DTaP/Tdap/Td (1 - Tdap) Never done   Medicare Annual Wellness (AWV)  12/10/2022    There are no preventive care reminders to display for this patient.   Lab Results  Component Value Date   TSH 1.920 11/11/2023   Lab Results  Component Value Date    WBC 6.2 11/11/2023   HGB 13.3 11/11/2023   HCT 41.0 11/11/2023   MCV 96 11/11/2023   PLT 332 11/11/2023   Lab Results  Component Value Date   NA 137 11/11/2023   K 4.6 11/11/2023   CO2 25 11/11/2023   GLUCOSE 110 (H) 11/11/2023   BUN 18 11/11/2023   CREATININE 0.75 11/11/2023   BILITOT 0.3 11/11/2023   ALKPHOS 85 11/11/2023   AST 16 11/11/2023   ALT 13 11/11/2023   PROT 6.9 11/11/2023   ALBUMIN 4.6 11/11/2023   CALCIUM  9.7 11/11/2023   ANIONGAP 14 09/30/2022   EGFR 86 11/11/2023   Lab Results  Component Value Date   CHOL 178 10/12/2022   Lab Results  Component Value Date   HDL 62 10/12/2022   Lab Results  Component Value Date   LDLCALC 97 10/12/2022   Lab Results  Component Value Date   TRIG 104 10/12/2022   Lab Results  Component Value Date   CHOLHDL 2.9 10/12/2022   Lab Results  Component Value Date   HGBA1C 5.5 11/11/2023        Results for orders placed or performed in visit on 01/31/24  POCT Influenza A/B   Collection Time: 01/31/24  9:37 AM  Result Value Ref Range   Influenza A, POC Negative Negative   Influenza B, POC Negative Negative  POC COVID-19 BinaxNow   Collection Time: 01/31/24  9:37 AM  Result Value Ref Range   SARS Coronavirus 2 Ag Negative Negative     Assessment & Plan:   Assessment & Plan Acute non-recurrent maxillary sinusitis Symptoms include sore throat, nasal congestion, and head pressure. Negative for flu and COVID. No fever, but sweating present. Breathing suboptimal without wheezing or asthma. - Prescribed Z-Pak (  azithromycin ). Concerning for sinusitis. Orders:   POCT Influenza A/B   POC COVID-19 BinaxNow  Chronic allergic rhinitis Recommend regular use of flonase .  Orders:   fluticasone  (FLONASE ) 50 MCG/ACT nasal spray; Place 2 sprays into both nostrils daily.  NSAID long-term use Patient has been taking PAIN AWAY which is helping immensely.  This is a natural nsaid. I recommend checking chemistry panel  (liver and kidney function) today.  Orders:   Comprehensive metabolic panel with GFR     Body mass index is 42.07 kg/m..      Meds ordered this encounter  Medications   fluticasone  (FLONASE ) 50 MCG/ACT nasal spray    Sig: Place 2 sprays into both nostrils daily.    Dispense:  16 g    Refill:  11   azithromycin  (ZITHROMAX  Z-PAK) 250 MG tablet    Sig: 2 DAILY FOR FIRST DAY, THEN DECREASE TO ONE DAILY FOR 4 MORE DAYS.    Dispense:  6 each    Refill:  0    Orders Placed This Encounter  Procedures   Comprehensive metabolic panel with GFR   POCT Influenza A/B   POC COVID-19 BinaxNow     Follow-up: Return if symptoms worsen or fail to improve.  An After Visit Summary was printed and given to the patient.  Abigail Free, MD [facility] [phone]     [1] No Known Allergies  "

## 2024-01-31 NOTE — Assessment & Plan Note (Addendum)
 Symptoms include sore throat, nasal congestion, and head pressure. Negative for flu and COVID. No fever, but sweating present. Breathing suboptimal without wheezing or asthma. - Prescribed Z-Pak (azithromycin ). Concerning for sinusitis. Orders:   POCT Influenza A/B   POC COVID-19 BinaxNow

## 2024-01-31 NOTE — Assessment & Plan Note (Signed)
 Patient has been taking PAIN AWAY which is helping immensely.  This is a natural nsaid. I recommend checking chemistry panel (liver and kidney function) today.  Orders:   Comprehensive metabolic panel with GFR

## 2024-02-01 LAB — COMPREHENSIVE METABOLIC PANEL WITH GFR
ALT: 17 IU/L (ref 0–32)
AST: 17 IU/L (ref 0–40)
Albumin: 4 g/dL (ref 3.9–4.9)
Alkaline Phosphatase: 98 IU/L (ref 49–135)
BUN/Creatinine Ratio: 32 — ABNORMAL HIGH (ref 12–28)
BUN: 29 mg/dL — ABNORMAL HIGH (ref 8–27)
Bilirubin Total: 0.2 mg/dL (ref 0.0–1.2)
CO2: 26 mmol/L (ref 20–29)
Calcium: 9.7 mg/dL (ref 8.7–10.3)
Chloride: 97 mmol/L (ref 96–106)
Creatinine, Ser: 0.92 mg/dL (ref 0.57–1.00)
Globulin, Total: 2.6 g/dL (ref 1.5–4.5)
Glucose: 80 mg/dL (ref 70–99)
Potassium: 4.8 mmol/L (ref 3.5–5.2)
Sodium: 141 mmol/L (ref 134–144)
Total Protein: 6.6 g/dL (ref 6.0–8.5)
eGFR: 67 mL/min/1.73

## 2024-02-04 ENCOUNTER — Ambulatory Visit: Payer: Self-pay | Admitting: Family Medicine

## 2024-02-08 DIAGNOSIS — I480 Paroxysmal atrial fibrillation: Secondary | ICD-10-CM

## 2024-02-08 NOTE — Telephone Encounter (Signed)
 Prescription refill request for Xarelto  received.  Indication:afib Last office visit:9/25 Weight:104.3  kg Age:70 Scr:0.92  12/25 CrCl:93.69  ml/min  Prescription refilled

## 2024-02-16 ENCOUNTER — Other Ambulatory Visit: Payer: Self-pay | Admitting: Family Medicine

## 2024-02-16 DIAGNOSIS — I10 Essential (primary) hypertension: Secondary | ICD-10-CM

## 2024-02-16 DIAGNOSIS — E782 Mixed hyperlipidemia: Secondary | ICD-10-CM

## 2024-02-17 ENCOUNTER — Other Ambulatory Visit: Payer: Self-pay | Admitting: Cardiology

## 2024-02-17 DIAGNOSIS — I480 Paroxysmal atrial fibrillation: Secondary | ICD-10-CM

## 2024-03-03 ENCOUNTER — Other Ambulatory Visit: Payer: Self-pay | Admitting: Family Medicine

## 2024-03-03 DIAGNOSIS — I10 Essential (primary) hypertension: Secondary | ICD-10-CM

## 2024-05-11 ENCOUNTER — Ambulatory Visit: Admitting: Family Medicine
